# Patient Record
Sex: Female | Born: 1965 | Race: White | Hispanic: No | Marital: Married | Smoking: Former smoker
Health system: Southern US, Community
[De-identification: ages and names within clinical notes are randomized; demographics above are authoritative.]

## PROBLEM LIST (undated history)

## (undated) DIAGNOSIS — K219 Gastro-esophageal reflux disease without esophagitis: Secondary | ICD-10-CM

## (undated) DIAGNOSIS — M1990 Unspecified osteoarthritis, unspecified site: Secondary | ICD-10-CM

## (undated) DIAGNOSIS — J45909 Unspecified asthma, uncomplicated: Secondary | ICD-10-CM

## (undated) DIAGNOSIS — J449 Chronic obstructive pulmonary disease, unspecified: Secondary | ICD-10-CM

## (undated) DIAGNOSIS — E669 Obesity, unspecified: Secondary | ICD-10-CM

## (undated) DIAGNOSIS — R42 Dizziness and giddiness: Secondary | ICD-10-CM

## (undated) HISTORY — DX: Gastro-esophageal reflux disease without esophagitis: K21.9

## (undated) HISTORY — PX: UPPER GASTROINTESTINAL ENDOSCOPY: SHX188

## (undated) HISTORY — PX: LAPAROSCOPIC GASTRIC BANDING: SHX1100

## (undated) HISTORY — PX: BLADDER SURGERY: SHX569

## (undated) HISTORY — PX: TUBAL LIGATION: SHX77

## (undated) HISTORY — DX: Unspecified osteoarthritis, unspecified site: M19.90

## (undated) HISTORY — DX: Obesity, unspecified: E66.9

## (undated) HISTORY — DX: Chronic obstructive pulmonary disease, unspecified: J44.9

## (undated) HISTORY — PX: HERNIA REPAIR: SHX51

## (undated) HISTORY — DX: Unspecified asthma, uncomplicated: J45.909

---

## 2001-01-13 ENCOUNTER — Ambulatory Visit (HOSPITAL_COMMUNITY): Admission: RE | Admit: 2001-01-13 | Discharge: 2001-01-13 | Payer: Self-pay | Admitting: Pulmonary Disease

## 2001-04-25 ENCOUNTER — Ambulatory Visit (HOSPITAL_COMMUNITY): Admission: RE | Admit: 2001-04-25 | Discharge: 2001-04-25 | Payer: Self-pay | Admitting: Pulmonary Disease

## 2001-05-21 ENCOUNTER — Ambulatory Visit (HOSPITAL_COMMUNITY): Admission: RE | Admit: 2001-05-21 | Discharge: 2001-05-21 | Payer: Self-pay | Admitting: Pulmonary Disease

## 2001-05-23 ENCOUNTER — Ambulatory Visit (HOSPITAL_COMMUNITY): Admission: RE | Admit: 2001-05-23 | Discharge: 2001-05-23 | Payer: Self-pay | Admitting: General Surgery

## 2001-09-05 ENCOUNTER — Ambulatory Visit (HOSPITAL_COMMUNITY): Admission: RE | Admit: 2001-09-05 | Discharge: 2001-09-05 | Payer: Self-pay | Admitting: Pulmonary Disease

## 2001-09-16 ENCOUNTER — Ambulatory Visit (HOSPITAL_COMMUNITY): Admission: RE | Admit: 2001-09-16 | Discharge: 2001-09-16 | Payer: Self-pay | Admitting: Pulmonary Disease

## 2001-10-06 ENCOUNTER — Ambulatory Visit (HOSPITAL_COMMUNITY): Admission: RE | Admit: 2001-10-06 | Discharge: 2001-10-06 | Payer: Self-pay | Admitting: Pulmonary Disease

## 2001-10-08 ENCOUNTER — Ambulatory Visit: Admission: RE | Admit: 2001-10-08 | Discharge: 2001-10-08 | Payer: Self-pay | Admitting: Pulmonary Disease

## 2002-11-18 ENCOUNTER — Ambulatory Visit (HOSPITAL_COMMUNITY): Admission: RE | Admit: 2002-11-18 | Discharge: 2002-11-18 | Payer: Self-pay | Admitting: Pulmonary Disease

## 2003-01-05 ENCOUNTER — Emergency Department (HOSPITAL_COMMUNITY): Admission: EM | Admit: 2003-01-05 | Discharge: 2003-01-05 | Payer: Self-pay | Admitting: *Deleted

## 2004-02-06 ENCOUNTER — Emergency Department (HOSPITAL_COMMUNITY): Admission: EM | Admit: 2004-02-06 | Discharge: 2004-02-06 | Payer: Self-pay | Admitting: Emergency Medicine

## 2004-10-02 ENCOUNTER — Ambulatory Visit (HOSPITAL_COMMUNITY): Admission: RE | Admit: 2004-10-02 | Discharge: 2004-10-02 | Payer: Self-pay | Admitting: Pulmonary Disease

## 2004-10-06 ENCOUNTER — Ambulatory Visit (HOSPITAL_COMMUNITY): Admission: RE | Admit: 2004-10-06 | Discharge: 2004-10-06 | Payer: Self-pay | Admitting: Pulmonary Disease

## 2005-06-20 ENCOUNTER — Ambulatory Visit (HOSPITAL_COMMUNITY): Admission: RE | Admit: 2005-06-20 | Discharge: 2005-06-20 | Payer: Self-pay | Admitting: General Surgery

## 2005-10-31 ENCOUNTER — Ambulatory Visit (HOSPITAL_COMMUNITY): Admission: RE | Admit: 2005-10-31 | Discharge: 2005-10-31 | Payer: Self-pay | Admitting: Pulmonary Disease

## 2006-02-12 ENCOUNTER — Ambulatory Visit (HOSPITAL_COMMUNITY): Admission: RE | Admit: 2006-02-12 | Discharge: 2006-02-12 | Payer: Self-pay | Admitting: Pulmonary Disease

## 2008-01-10 ENCOUNTER — Emergency Department (HOSPITAL_COMMUNITY): Admission: EM | Admit: 2008-01-10 | Discharge: 2008-01-11 | Payer: Self-pay | Admitting: Emergency Medicine

## 2008-01-12 ENCOUNTER — Encounter: Payer: Self-pay | Admitting: Internal Medicine

## 2008-04-17 ENCOUNTER — Emergency Department (HOSPITAL_COMMUNITY): Admission: EM | Admit: 2008-04-17 | Discharge: 2008-04-18 | Payer: Self-pay | Admitting: Emergency Medicine

## 2008-04-17 ENCOUNTER — Ambulatory Visit: Payer: Self-pay | Admitting: Gastroenterology

## 2008-11-24 ENCOUNTER — Ambulatory Visit (HOSPITAL_COMMUNITY): Admission: RE | Admit: 2008-11-24 | Discharge: 2008-11-24 | Payer: Self-pay | Admitting: Pulmonary Disease

## 2009-07-20 ENCOUNTER — Other Ambulatory Visit: Admission: RE | Admit: 2009-07-20 | Discharge: 2009-07-20 | Payer: Self-pay | Admitting: Obstetrics & Gynecology

## 2009-07-28 ENCOUNTER — Ambulatory Visit (HOSPITAL_COMMUNITY): Admission: RE | Admit: 2009-07-28 | Discharge: 2009-07-28 | Payer: Self-pay | Admitting: Obstetrics & Gynecology

## 2009-08-17 ENCOUNTER — Emergency Department (HOSPITAL_COMMUNITY): Admission: EM | Admit: 2009-08-17 | Discharge: 2009-08-18 | Payer: Self-pay | Admitting: Emergency Medicine

## 2010-04-14 ENCOUNTER — Ambulatory Visit (HOSPITAL_COMMUNITY): Admission: RE | Admit: 2010-04-14 | Discharge: 2010-04-14 | Payer: Self-pay | Admitting: Obstetrics & Gynecology

## 2010-06-26 ENCOUNTER — Ambulatory Visit (HOSPITAL_COMMUNITY)
Admission: EM | Admit: 2010-06-26 | Discharge: 2010-06-26 | Payer: Self-pay | Source: Home / Self Care | Admitting: Emergency Medicine

## 2010-06-26 ENCOUNTER — Encounter: Payer: Self-pay | Admitting: Gastroenterology

## 2010-06-27 LAB — POCT I-STAT, CHEM 8
Glucose, Bld: 129 mg/dL — ABNORMAL HIGH (ref 70–99)
HCT: 44 % (ref 36.0–46.0)
Hemoglobin: 15 g/dL (ref 12.0–15.0)
Potassium: 3.9 mEq/L (ref 3.5–5.1)
Sodium: 139 mEq/L (ref 135–145)

## 2010-06-27 LAB — GLUCOSE, CAPILLARY: Glucose-Capillary: 158 mg/dL — ABNORMAL HIGH (ref 70–99)

## 2010-07-06 NOTE — Procedures (Signed)
Summary: Upper Endoscopy  Patient: [name] Note: All result statuses are Final unless otherwise noted.  Tests: (1) Upper Endoscopy (EGD)   EGD Upper Endoscopy       DONE     [facility]     [address]     [place], [place]  [address]           ENDOSCOPY PROCEDURE REPORT           PATIENT:  Terri [name], Terri [name]  MR#:  [id number]     BIRTHDATE:  November 28, 1965, 44 yrs. old  GENDER:  female     ENDOSCOPIST:  Rachael Fee, MD     PROCEDURE DATE:  06/26/2010     PROCEDURE:  EGD, diagnostic 43235     ASA CLASS:  Class II     INDICATIONS:  vomiting, suspected food impaction at Lap Band site     (has happened twice previously)     MEDICATIONS:  Fentanyl 75 mcg IV, Versed 7.5 mg IV     TOPICAL ANESTHETIC:  Cetacaine Spray           DESCRIPTION OF PROCEDURE:   After the risks benefits and     alternatives of the procedure were thoroughly explained, informed     consent was obtained.  The Pentax Gastroscope [id number] endoscope     was introduced through the mouth and advanced to the second     portion of the duodenum, without limitations.  The instrument was     slowly withdrawn as the mucosa was fully examined.     <<PROCEDUREIMAGES>>     There was narrowing of proximal gastric lumen at site of Lap Band.     There was no food proximally. There was a fairly large food bolus     in stomach. This likely just passed the band site with sedation     for the EGD (see image008 and image007).  Otherwise the     examination was normal (see image001, image004, image005, and     image006).    Retroflexed views revealed no abnormalities.    The     scope was then withdrawn from the patient and the procedure     completed.           COMPLICATIONS:  None           ENDOSCOPIC IMPRESSION:     1) Lap Band narrowing in proximal stomach.  Large food bolus in     distal stomach was likely impacted at lap band site and passed     just prior to EGD, perhaps from sedation given for the  procedure.           2) Otherwise normal examination           RECOMMENDATIONS:     This the third food impaction at Lap Band site in 3 years.     Should follow diet recommendations more strictly, chew food very     well and consider having some of the band fluid removed by     bariatric surgery.           ______________________________     Rachael Fee, MD           n.     eSIGNED:   Rachael Fee at 06/26/2010 01:52 PM           Carmelina Dane, [id number]  Note: An exclamation mark (!) indicates a result that was not  dispersed into the flowsheet. Document Creation Date: 06/26/2010 1:52 PM _______________________________________________________________________  (1) Order result status: Final Collection or observation date-time: 06/26/2010 13:44 Requested date-time:  Receipt date-time:  Reported date-time:  Referring Physician:   Ordering Physician: Rob Bunting [phone]) Specimen Source:  Source: Launa Grill Order Number: [id number] Lab site:

## 2010-08-13 ENCOUNTER — Emergency Department (HOSPITAL_COMMUNITY): Payer: Medicare Other

## 2010-08-13 ENCOUNTER — Emergency Department (HOSPITAL_COMMUNITY)
Admission: EM | Admit: 2010-08-13 | Discharge: 2010-08-13 | Disposition: A | Discharge status: Home / Self Care | Payer: Medicare Other | Attending: Emergency Medicine | Admitting: Emergency Medicine

## 2010-08-13 DIAGNOSIS — R42 Dizziness and giddiness: Secondary | ICD-10-CM | POA: Insufficient documentation

## 2010-08-13 DIAGNOSIS — J449 Chronic obstructive pulmonary disease, unspecified: Secondary | ICD-10-CM | POA: Insufficient documentation

## 2010-08-13 DIAGNOSIS — K219 Gastro-esophageal reflux disease without esophagitis: Secondary | ICD-10-CM | POA: Insufficient documentation

## 2010-08-13 DIAGNOSIS — H9319 Tinnitus, unspecified ear: Secondary | ICD-10-CM | POA: Insufficient documentation

## 2010-08-13 DIAGNOSIS — H8109 Meniere's disease, unspecified ear: Secondary | ICD-10-CM | POA: Insufficient documentation

## 2010-08-13 DIAGNOSIS — Z79899 Other long term (current) drug therapy: Secondary | ICD-10-CM | POA: Insufficient documentation

## 2010-08-13 DIAGNOSIS — E119 Type 2 diabetes mellitus without complications: Secondary | ICD-10-CM | POA: Insufficient documentation

## 2010-08-13 DIAGNOSIS — J4489 Other specified chronic obstructive pulmonary disease: Secondary | ICD-10-CM | POA: Insufficient documentation

## 2010-08-13 LAB — BASIC METABOLIC PANEL
CO2: 27 mEq/L (ref 19–32)
GFR calc Af Amer: >60 mL/min (ref >60)
GFR calc non Af Amer: >60 mL/min (ref >60)
Glucose, Bld: 265 mg/dL — ABNORMAL HIGH (ref 70–99)
Sodium: 137 mEq/L (ref 135–145)

## 2010-08-13 LAB — GLUCOSE, CAPILLARY: Glucose-Capillary: 188 mg/dL — ABNORMAL HIGH (ref 70–99)

## 2010-08-15 LAB — CBC
HCT: 42.7 % (ref 36.0–46.0)
Hemoglobin: 14.3 g/dL (ref 12.0–15.0)
MCH: 29.8 pg (ref 26.0–34.0)
MCV: 88.9 fL (ref 78.0–100.0)
Platelets: 234 10*3/uL (ref 150–400)
RBC: 4.81 MIL/uL (ref 3.87–5.11)

## 2010-08-15 LAB — COMPREHENSIVE METABOLIC PANEL
ALT: 20 U/L (ref 0–35)
Albumin: 3.4 g/dL — ABNORMAL LOW (ref 3.5–5.2)
BUN: 8 mg/dL (ref 6–23)
GFR calc non Af Amer: >60 mL/min (ref >60)
Potassium: 4 mEq/L (ref 3.5–5.1)
Sodium: 131 mEq/L — ABNORMAL LOW (ref 135–145)

## 2010-08-21 ENCOUNTER — Encounter (INDEPENDENT_AMBULATORY_CARE_PROVIDER_SITE_OTHER): Payer: Self-pay | Admitting: *Deleted

## 2010-08-21 ENCOUNTER — Ambulatory Visit: Payer: Self-pay | Admitting: Gastroenterology

## 2010-08-31 NOTE — Letter (Signed)
Summary: New Patient letter  [facility]  [address] [place], [place] [address]   Phone: [phone]  Fax: [phone]       08/21/2010 MRN: [id number]  [facility] [address] [place], [place]  [address]  Dear Ms. Valcarcel,  Welcome to the Gastroenterology Division at [facility].    You are scheduled to see Dr.  Christella Hartigan on 10-02-10 at 10:00A.M. on the 3rd floor at [facility], [address]  We ask that you try to arrive at our office 15 minutes prior to your appointment time to allow for check-in.  We would like you to complete the enclosed self-administered evaluation form prior to your visit and bring it with you on the day of your appointment.  We will review it with you.  Also, please bring a complete list of all your medications or, if you prefer, bring the medication bottles and we will list them.  Please bring your insurance card so that we may make a copy of it.  If your insurance requires a referral to see a specialist, please bring your referral form from your primary care physician.  Co-payments are due at the time of your visit and may be paid by cash, check or credit card.     Your office visit will consist of a consult with your physician (includes a physical exam), any laboratory testing he/she may order, scheduling of any necessary diagnostic testing (e.g. x-ray, ultrasound, CT-scan), and scheduling of a procedure (e.g. Endoscopy, Colonoscopy) if required.  Please allow enough time on your schedule to allow for any/all of these possibilities.    If you cannot keep your appointment, please call [phone] to cancel or reschedule prior to your appointment date.  This allows [place] the opportunity to schedule an appointment for another patient in need of care.  If you do not cancel or reschedule by 5 p.m. the business day prior to your appointment date, you will be charged a $50.00 late cancellation/no-show fee.    Thank you for choosing [facility] for your medical needs.  We appreciate the opportunity to care for you.  Please visit [place] at our website  to learn more about our practice.                     Sincerely,                                                             The Gastroenterology Division

## 2010-10-02 ENCOUNTER — Ambulatory Visit: Payer: Medicare Other | Admitting: Internal Medicine

## 2010-10-02 ENCOUNTER — Encounter: Payer: Self-pay | Admitting: Gastroenterology

## 2010-10-02 ENCOUNTER — Ambulatory Visit (INDEPENDENT_AMBULATORY_CARE_PROVIDER_SITE_OTHER): Payer: Medicare Other | Admitting: Gastroenterology

## 2010-10-02 DIAGNOSIS — R1310 Dysphagia, unspecified: Secondary | ICD-10-CM

## 2010-10-02 NOTE — Progress Notes (Signed)
HPI: This is a  very pleasant 45 year old woman whom I last saw 3 months ago at the time of EGD, acute dysphasia.  Has chronic intermittent dysphagia to solid foods, Not to liquids at all.  Overall stable weight.   [place]ent to [place] to bariatric facility in [place], there was only 2cc of fluid in the band, this was removed.  She was told her problem may be from her eophagus.  EGD 3-4 months ago, by me. Found a narrowing in her proximal stomach that I felt was likely from the lap band. Food had artery progressed into her distal stomach and I suspected that the sedation caused a relaxation and the food bolus which was probably impacted was able to pass more distally.  Following that she went to a bariatric facility in [place] to remove fluid from the lap band but they told her that there was very little fluid in it and he suspected her dysphasia issues were more related to an esophageal problem.  Lap band 2009, swallowing troubles started 2010 or early 2011.   Review of systems: Pertinent positive and negative review of systems were noted in the above HPI section.  All other review of systems was otherwise negative.   Past Medical History, Past Surgical History, Family History, Social History, Current Medications, Allergies were all reviewed with the patient via [facility] electronic medical record system.   Physical Exam: BP 128/76  Pulse 64  Ht 5\' 6"  (1.676 m)  Wt 261 lb (118.389 kg)  BMI 42.13 kg/m2 Constitutional: generally well-appearing Psychiatric: alert and oriented x3 Eyes: extraocular movements intact Mouth: oral pharynx moist, no lesions Neck: supple no lymphadenopathy Cardiovascular: heart regular rate and rhythm Lungs: clear to auscultation bilaterally Abdomen: soft, nontender, nondistended, no obvious ascites, no peritoneal signs, normal bowel sounds Extremities: no lower extremity edema bilaterally Skin: no lesions on visible extremities    Assessment and  plan: 45 y.o. female with intermittent dysphagia to solids  She may have underlying esophageal issue. I think I will first proceed with a area of esophagram and upper GI to see if she has a narrowing at the site of her previous lap band surgery. I understand there was very little fluid in it but perhaps the apparatus itself is causing too much luminal narrowing. She also does not have upper teeth and so cannot chew her food well. That is likely contributing to her problem. She does however tell me that even eggs and mashed potatoes can get cough. Depending on the results of the radiology testing I may proceed with esophageal manometry.

## 2010-10-02 NOTE — Patient Instructions (Signed)
We will look for previous EGD reports (prior to 06/2010 EGD with Dr. Christella Hartigan). Barium esophagram, UGI to check for strictures, narrowings (h/o lap band procedure 2009). You may need a muscle study of esophagus (manometry test) pending the results of the radiology test.

## 2010-10-05 ENCOUNTER — Other Ambulatory Visit: Payer: Self-pay | Admitting: Gastroenterology

## 2010-10-05 ENCOUNTER — Ambulatory Visit (HOSPITAL_COMMUNITY): Admission: RE | Admit: 2010-10-05 | Payer: Medicare Other | Source: Ambulatory Visit

## 2010-10-05 ENCOUNTER — Ambulatory Visit (HOSPITAL_COMMUNITY)
Admission: RE | Admit: 2010-10-05 | Discharge: 2010-10-05 | Disposition: A | Discharge status: Home / Self Care | Payer: Medicare Other | Source: Ambulatory Visit | Attending: Gastroenterology | Admitting: Gastroenterology

## 2010-10-05 DIAGNOSIS — K3189 Other diseases of stomach and duodenum: Secondary | ICD-10-CM | POA: Insufficient documentation

## 2010-10-05 DIAGNOSIS — Z9884 Bariatric surgery status: Secondary | ICD-10-CM | POA: Insufficient documentation

## 2010-10-05 DIAGNOSIS — R110 Nausea: Secondary | ICD-10-CM | POA: Insufficient documentation

## 2010-10-05 DIAGNOSIS — R1013 Epigastric pain: Secondary | ICD-10-CM | POA: Insufficient documentation

## 2010-10-06 ENCOUNTER — Telehealth: Payer: Self-pay

## 2010-10-06 DIAGNOSIS — R110 Nausea: Secondary | ICD-10-CM

## 2010-10-06 NOTE — Telephone Encounter (Signed)
Pt aware of both test date and times and all instructions, the instructions have also been mailed to the home,  Pt aware to call with any questions

## 2010-10-06 NOTE — Telephone Encounter (Signed)
Message copied by Chales Abrahams on Fri Oct 06, 2010  8:25 AM ------      Message from: Rob Bunting      Created: Thu Oct 05, 2010  9:05 PM                   Please call the patient.  She needs a gastric emptying scan and also an esophageal manometry test at [facility].  After both of those, I'd like to see her in office for rov to discuss all the results.            thanks

## 2010-10-11 ENCOUNTER — Telehealth: Payer: Self-pay | Admitting: Gastroenterology

## 2010-10-11 NOTE — Telephone Encounter (Signed)
EGD with Dr. Leone Payor 01/2008; no food in esophagus, +food in proximal stomach EGD with Dr. Russella Dar 04/2008: retained food in distal esophagus then free text "meat impaction at lap band site pushed gently into the stomach"

## 2010-10-13 ENCOUNTER — Encounter (HOSPITAL_COMMUNITY): Payer: Self-pay

## 2010-10-13 ENCOUNTER — Encounter (HOSPITAL_COMMUNITY)
Admission: RE | Admit: 2010-10-13 | Discharge: 2010-10-13 | Disposition: A | Discharge status: Home / Self Care | Payer: Medicare Other | Source: Ambulatory Visit | Attending: Gastroenterology | Admitting: Gastroenterology

## 2010-10-13 DIAGNOSIS — R110 Nausea: Secondary | ICD-10-CM

## 2010-10-13 DIAGNOSIS — R1310 Dysphagia, unspecified: Secondary | ICD-10-CM | POA: Insufficient documentation

## 2010-10-13 DIAGNOSIS — R112 Nausea with vomiting, unspecified: Secondary | ICD-10-CM | POA: Insufficient documentation

## 2010-10-13 DIAGNOSIS — K219 Gastro-esophageal reflux disease without esophagitis: Secondary | ICD-10-CM | POA: Insufficient documentation

## 2010-10-13 MED ORDER — TECHNETIUM TC 99M SULFUR COLLOID
1.8000 | Freq: Once | INTRAVENOUS | Status: AC | PRN
  Administered 2010-10-13: 1.8 via INTRAVENOUS

## 2010-10-20 NOTE — Op Note (Signed)
[facility]  Patient:    Terri [name], Terri [name] Visit Number: [id number] MRN: [id number]          Service Type: DSU Location: DAY Attending Physician:  Dalia Heading Dictated by:   Franky Macho, M.D. Proc. Date: 05/23/01 Admit Date:  05/23/2001   CC:         Kari Baars, M.D.   Operative Report  AGE:  45 years old.  PREOPERATIVE DIAGNOSIS:  Umbilical hernia.  POSTOPERATIVE DIAGNOSIS:  Umbilical hernia.  PROCEDURE:  Umbilical herniorrhaphy.  SURGEON:  Franky Macho, M.D.  ANESTHESIA:  General.  INDICATIONS:  Patient is a 45 year old white female who presents with a symptomatic umbilical hernia.  The risks and benefits of the procedure including bleeding, infection, and cardiopulmonary difficulties as well as recurrence of the hernia were fully explained to the patient, who gave informed consent.  DESCRIPTION OF PROCEDURE:  Patient was placed in the supine position.  After general anesthesia was administered, the abdomen was prepped and draped using the usual sterile technique with Betadine.  An infraumbilical incision was made down to the fascia.  The umbilicus was freed away from the underlying fascia.  The hernia defect was identified and its contents reduced into the abdomen.  The fascia was then reapproximated using 0 Surgidac interrupted sutures.  The base of the umbilicus was secured to the fascia using a 2-0 Vicryl interrupted suture.  The subcutaneous layer was reapproximated using a 3-0 Vicryl interrupted suture.  The skin was closed using a 4-0 Vicryl subcuticular suture.  Marcaine 0.5% was instilled into the surrounding wound and the wound was covered with collodion.  All tape and needle counts were correct at the end of the procedure.  The patient was awakened and transferred to PACU in stable condition.  COMPLICATIONS:  None.  SPECIMEN:  None.  BLOOD LOSS:  Minimal. Dictated by:   Franky Macho, M.D. Attending  Physician:  Dalia Heading DD:  05/23/01 TD:  05/24/01 Job: [id number]

## 2010-10-20 NOTE — Procedures (Signed)
[facility]  Patient:    Terri [name], Terri [name] Visit Number: [id number] MRN: [id number]          Service Type: Attending:  Kari Baars, M.D. Dictated by:   Kari Baars, M.D.                      Pulmonary Function Test Inter.  IMPRESSION: 1. Spirometry shows moderate ventilatory defect with air flow obstruction in    the area of the small airways. 2. Lung volumes show moderate restrictive change and some evidence of air    trapping. 3. DLCO is severely reduced. 4. There is no significant bronchodilator response. 5. Arterial blood gases are normal.  ADDENDUM:  Apparently patient had a great deal of coughing during the examination and the flow volume loops are not tremendously reproducible which may indicate a test that is of less accuracy in reproducibility. Dictated by:   Kari Baars, M.D. Attending:  Kari Baars, M.D. DD:  05/21/01 TD:  05/22/01 Job: [id number]

## 2010-10-20 NOTE — Op Note (Signed)
Terri [name], Terri [name]               ACCOUNT NO.:  [id number]   MEDICAL RECORD NO.:  [id number]          PATIENT TYPE:  AMB   LOCATION:  DAY                           FACILITY:  [facility]   PHYSICIAN:  Dalia Heading, M.D.  DATE OF BIRTH:  23-Dec-1965   DATE OF PROCEDURE:  06/20/2005  DATE OF DISCHARGE:                                 OPERATIVE REPORT   PREOPERATIVE DIAGNOSIS:  Incisional hernia.   POSTOPERATIVE DIAGNOSIS:  Incisional hernia.   PROCEDURE:  Incisional herniorrhaphy with mesh.   SURGEON:  Dr. Franky Macho.   ANESTHESIA:  General endotracheal.   INDICATIONS:  The patient is a 45 year old white female status post an  umbilical herniorrhaphy in 2002 who recently began experiencing swelling and  pain at the umbilical region. She has an incisional hernia at the umbilicus  and now presents for incisional herniorrhaphy with mesh placement. Risks and  benefits of the procedure including bleeding, infection and recurrence of  the hernia were fully explained to the patient, who gave informed consent.   PROCEDURE NOTE:  The patient was placed in the supine position. After  induction of general endotracheal anesthesia, the abdomen was prepped and  draped using the usual sterile technique with Techni-Care. Surgical site  confirmation was performed.   An infraumbilical incision was made down to the fascia. The hernia was freed  away from the underlying hernia defect. The hernia sac was excised to  healthy fascial edges. The omentum was noted be within the hernia sac, and  this was reduced without difficulty. A medium-sized polypropylene mesh plug  was then placed into this region and secured circumferentially to the  transversalis fascia using 2-0 Novofil interrupted sutures. The fascia  overlying the patch was then closed transversely using 0 Ethilon interrupted  sutures. The base the umbilicus was secured back to the fascia using a 2-0  Vicryl interrupted suture. The subcutaneous  layer was reapproximated using 3-  0 Vicryl interrupted suture. The skin was closed using staples. Sensorcaine  0.5% was instilled into the surrounding wound. Bacitracin ointment and dry  sterile dressing were then applied.   All tape and needle counts were correct at the end of the procedure. The  patient was extubated in the operating room and went back to recovery room  awake in stable condition.   COMPLICATIONS:  None.   SPECIMEN:  None.   BLOOD LOSS:  Minimal.      Dalia Heading, M.D.  Electronically Signed     MAJ/MEDQ  D:  06/20/2005  T:  06/20/2005  Job:  [id number]

## 2010-10-20 NOTE — H&P (Signed)
NAME:  Terri [name], Terri [name]               ACCOUNT NO.:  [id number]   MEDICAL RECORD NO.:  [id number]          PATIENT TYPE:  AMB   LOCATION:  DAY                           FACILITY:  [facility]   PHYSICIAN:  Dalia Heading, M.D.  DATE OF BIRTH:  1965-10-10   DATE OF ADMISSION:  DATE OF DISCHARGE:  LH                                HISTORY & PHYSICAL   CHIEF COMPLAINT:  Incisional hernia.   HISTORY OF PRESENT ILLNESS:  The patient is a 45 year old white female  status post an umbilical herniorrhaphy in 2002. Recently, she began  experiencing swelling and pain in the umbilical region. She had an episode  of coughing and feels like something has popped.   PAST MEDICAL HISTORY:  1.  Chronic bronchitis/asthma.  2.  Early COPD.   PAST SURGICAL HISTORY:  As noted above.  1.  Bladder tack up.  2.  Cesarean section.   CURRENT MEDICATIONS:  Wellbutrin, Tussin.   ALLERGIES:  CIPROFLOXACIN, SULFA, PENICILLIN.   REVIEW OF SYSTEMS:  The patient smokes less than a pack of cigarettes a day.  She denies any alcohol use. She denies any other cardiac problems or  bleeding difficulties.   PHYSICAL EXAMINATION:  GENERAL:  The patient is well-developed, well-  nourished, white female in no acute distress.  LUNGS:  Clear to auscultation with equal breath sounds bilaterally.  HEART:  Reveals a regular rate and rhythm without S3, S4 or murmurs.  ABDOMEN:  Soft, nontender, nondistended. No hepatosplenomegaly or masses are  noted. Reducible supraumbilical hernia is noted.   IMPRESSION:  Incisional hernia.   PLAN:  The patient was scheduled for incisional herniorrhaphy with possible  mesh placement on June 20, 2005. The risks and benefits of the procedure  including bleeding, infection, recurrence of the hernia were fully explained  to the patient who gave informed consent.      Dalia Heading, M.D.  Electronically Signed     MAJ/MEDQ  D:  06/19/2005  T:  06/19/2005  Job:  [id number]

## 2010-10-20 NOTE — Procedures (Signed)
[facility]  Patient:    [name], [name] Visit Number: [id number] MRN: [id number]          Service Type: Attending:  Kari Baars, M.D. Dictated by:   Kari Baars, M.D. Proc. Date: 09/05/01 Adm. Date:  09/05/01                      Pulmonary Function Test Inter.  RESULTS: 1. Spirometry shows severe ventilatory defect with a pattern that looks mostly    from restrictive change. 2. Lung volumes show restrictive changes. 3. DLCO is severely reduced. 4. Former arterial blood gases are normal. 5. There is no significant bronchodilator response. Dictated by:   Kari Baars, M.D. Attending:  Kari Baars, M.D. DD:  09/05/01 TD:  09/06/01 Job: [id number]

## 2010-10-23 ENCOUNTER — Ambulatory Visit (HOSPITAL_COMMUNITY)
Admission: RE | Admit: 2010-10-23 | Discharge: 2010-10-23 | Disposition: A | Discharge status: Home / Self Care | Payer: Medicare Other | Source: Ambulatory Visit | Attending: Gastroenterology | Admitting: Gastroenterology

## 2011-10-30 ENCOUNTER — Other Ambulatory Visit (HOSPITAL_COMMUNITY): Payer: Self-pay | Admitting: Pulmonary Disease

## 2011-10-30 ENCOUNTER — Ambulatory Visit (HOSPITAL_COMMUNITY)
Admission: RE | Admit: 2011-10-30 | Discharge: 2011-10-30 | Disposition: A | Discharge status: Home / Self Care | Payer: Medicare Other | Source: Ambulatory Visit | Attending: Pulmonary Disease | Admitting: Pulmonary Disease

## 2011-10-30 DIAGNOSIS — M25529 Pain in unspecified elbow: Secondary | ICD-10-CM | POA: Insufficient documentation

## 2011-10-30 DIAGNOSIS — M25539 Pain in unspecified wrist: Secondary | ICD-10-CM | POA: Insufficient documentation

## 2011-11-17 ENCOUNTER — Encounter (HOSPITAL_COMMUNITY): Payer: Self-pay | Admitting: Emergency Medicine

## 2011-11-17 ENCOUNTER — Encounter (HOSPITAL_COMMUNITY)
Admission: EM | Disposition: A | Discharge status: Home / Self Care | Payer: Self-pay | Source: Home / Self Care | Attending: Emergency Medicine

## 2011-11-17 ENCOUNTER — Ambulatory Visit (HOSPITAL_COMMUNITY)
Admission: EM | Admit: 2011-11-17 | Discharge: 2011-11-17 | Disposition: A | Discharge status: Home / Self Care | Payer: PRIVATE HEALTH INSURANCE | Attending: Emergency Medicine | Admitting: Emergency Medicine

## 2011-11-17 DIAGNOSIS — T182XXA Foreign body in stomach, initial encounter: Secondary | ICD-10-CM | POA: Insufficient documentation

## 2011-11-17 DIAGNOSIS — Z9884 Bariatric surgery status: Secondary | ICD-10-CM | POA: Insufficient documentation

## 2011-11-17 DIAGNOSIS — T18128A Food in esophagus causing other injury, initial encounter: Secondary | ICD-10-CM

## 2011-11-17 DIAGNOSIS — M129 Arthropathy, unspecified: Secondary | ICD-10-CM | POA: Insufficient documentation

## 2011-11-17 DIAGNOSIS — K219 Gastro-esophageal reflux disease without esophagitis: Secondary | ICD-10-CM | POA: Insufficient documentation

## 2011-11-17 DIAGNOSIS — J4489 Other specified chronic obstructive pulmonary disease: Secondary | ICD-10-CM | POA: Insufficient documentation

## 2011-11-17 DIAGNOSIS — IMO0002 Reserved for concepts with insufficient information to code with codable children: Secondary | ICD-10-CM | POA: Insufficient documentation

## 2011-11-17 DIAGNOSIS — T18108A Unspecified foreign body in esophagus causing other injury, initial encounter: Secondary | ICD-10-CM

## 2011-11-17 DIAGNOSIS — R1310 Dysphagia, unspecified: Secondary | ICD-10-CM

## 2011-11-17 DIAGNOSIS — E669 Obesity, unspecified: Secondary | ICD-10-CM | POA: Insufficient documentation

## 2011-11-17 DIAGNOSIS — J449 Chronic obstructive pulmonary disease, unspecified: Secondary | ICD-10-CM | POA: Insufficient documentation

## 2011-11-17 DIAGNOSIS — E119 Type 2 diabetes mellitus without complications: Secondary | ICD-10-CM | POA: Insufficient documentation

## 2011-11-17 HISTORY — PX: ESOPHAGOGASTRODUODENOSCOPY: SHX5428

## 2011-11-17 LAB — POCT I-STAT, CHEM 8
BUN: 14 mg/dL (ref 6–23)
Calcium, Ion: 1.15 mmol/L (ref 1.12–1.32)
Hemoglobin: 15 g/dL (ref 12.0–15.0)
Sodium: 140 mEq/L (ref 135–145)
TCO2: 23 mmol/L (ref 0–100)

## 2011-11-17 SURGERY — EGD (ESOPHAGOGASTRODUODENOSCOPY)
Anesthesia: Moderate Sedation

## 2011-11-17 MED ORDER — FENTANYL CITRATE 0.05 MG/ML IJ SOLN
INTRAMUSCULAR | Status: AC
  Filled 2011-11-17: qty 4

## 2011-11-17 MED ORDER — SODIUM CHLORIDE 0.9 % IV SOLN: Freq: Once | INTRAVENOUS | Status: DC

## 2011-11-17 MED ORDER — BUTAMBEN-TETRACAINE-BENZOCAINE 2-2-14 % EX AERO
INHALATION_SPRAY | CUTANEOUS | Status: DC | PRN
  Administered 2011-11-17: 2 via TOPICAL

## 2011-11-17 MED ORDER — DIPHENHYDRAMINE HCL 50 MG/ML IJ SOLN
INTRAMUSCULAR | Status: AC
  Filled 2011-11-17: qty 1

## 2011-11-17 MED ORDER — FENTANYL NICU IV SYRINGE 50 MCG/ML
INJECTION | INTRAMUSCULAR | Status: DC | PRN
  Administered 2011-11-17 (×4): 25 ug via INTRAVENOUS

## 2011-11-17 MED ORDER — MIDAZOLAM HCL 10 MG/2ML IJ SOLN
INTRAMUSCULAR | Status: DC | PRN
  Administered 2011-11-17 (×2): 2 mg via INTRAVENOUS
  Administered 2011-11-17: 1 mg via INTRAVENOUS
  Administered 2011-11-17: 2 mg via INTRAVENOUS

## 2011-11-17 MED ORDER — MIDAZOLAM HCL 10 MG/2ML IJ SOLN
INTRAMUSCULAR | Status: AC
  Filled 2011-11-17: qty 4

## 2011-11-17 NOTE — ED Provider Notes (Addendum)
History   This chart was scribed for Gwyneth Sprout, MD by Shari Heritage. The patient was seen in room STRE4/STRE4. Patient's care was started at 1710.     CSN: [id number]  Arrival date & time 11/17/11  1710   First MD Initiated Contact with Patient 11/17/11 1756      Chief Complaint  Patient presents with  . Swallowed Foreign Body    (Consider location/radiation/quality/duration/timing/severity/associated sxs/prior treatment) The history is provided by the patient. No language interpreter was used.   Terri [name] is a 46 y.o. female who presents to the Emergency Department complaining of a foreign body (steak) stuck in her esophagus onset yesterday. Patient denies difficulty swallowing and breathing.  Patient has had food stuck in her esophagus 3 times before. Each time a GI specialist was needed to resolve the issue.  Patient says that she struggles to eat and properly digest her food everyday. Patient usually cuts food very small and drinks an increased amount of fluids. Patient says when food is stuck, she can't drink or eat without vomiting up the fluids or food.  Patient says she needs surgery on her esophageal sphincter. She hadn't completed the surgery before due to a lapse in insurance.  Patient with h/o COPD, diabetes, GERD, and arthritis. Patient with surgical history of laparoscopic gastric banding, hernia repair, tubal ligation and bladder surgery.  Past Medical History  Diagnosis Date  . COPD (chronic obstructive pulmonary disease)   . Diabetes mellitus   . GERD (gastroesophageal reflux disease)   . Arthritis   . Obesity     Past Surgical History  Procedure Date  . Laparoscopic gastric banding   . Hernia repair   . Tubal ligation   . Bladder surgery     Family History  Problem Relation Age of Onset  . Colon cancer Neg Hx   . Breast cancer Mother   . Diabetes Mother     Sister and Father  . Heart disease Father     and Sister  . Cirrhosis Father       Non-alcholic     History  Substance Use Topics  . Smoking status: Former Smoker    Quit date: 11/16/2005  . Smokeless tobacco: Never Used  . Alcohol Use: Yes     occasion    OB History    Grav Para Term Preterm Abortions TAB SAB Ect Mult Living                  Review of Systems A complete 10 system review of systems was obtained and all systems are negative except as noted in the HPI and PMH.   Allergies  Cephalexin; Ciprofloxacin hcl; Penicillins; and Sulfa antibiotics  Home Medications   Current Outpatient Rx  Name Route Sig Dispense Refill  . GLYBURIDE-METFORMIN 5-500 MG PO TABS Oral Take 2 tablets by mouth at bedtime.       BP 128/56  Pulse 85  Temp 97.7 F (36.5 C) (Oral)  Resp 19  SpO2 97%  Physical Exam  Nursing note and vitals reviewed. Constitutional: She is oriented to person, place, and time. She appears well-developed and well-nourished. No distress.  HENT:  Head: Normocephalic and atraumatic.       Dry mucous membranes.  Eyes: Conjunctivae and EOM are normal.  Neck: Neck supple.  Cardiovascular: Normal rate.   Pulmonary/Chest: Effort normal. No respiratory distress.  Abdominal: She exhibits no distension.  Musculoskeletal: Normal range of motion.  Neurological: She is alert and oriented  to person, place, and time. No sensory deficit.  Skin: Skin is dry.  Psychiatric: She has a normal mood and affect. Her behavior is normal.    ED Course  Procedures (including critical care time) DIAGNOSTIC STUDIES: Oxygen Saturation is 97% on room air, adequate by my interpretation.    COORDINATION OF CARE: 6:05PM- Patient was screened. Patient will be moved from stretcher triage to CDU to be seen by a GI specialist.  Labs Reviewed - No data to display No results found.   1. Food impaction of esophagus       MDM   Patient with symptoms consistent with a food bolus impaction. They have been there 4 PM yesterday she thought it would improve  however today she has been able unable to eat or drink without vomiting. She also states she is not able to keep her saliva down either. She has had 3 endoscopies for the same thing. Once she had bolus impaction in the other 2 times were sphincter spasm. On exam patient is otherwise well appearing with dry mucous membranes. Will speak with GI to scope the patient.  Gi will come in and sedate the pt and do an endoscopy.   I personally performed the services described in this documentation, which was scribed in my presence.  The recorded information has been reviewed and considered.    Gwyneth Sprout, MD 11/17/11 1813  Gwyneth Sprout, MD 11/17/11 7846  Gwyneth Sprout, MD 11/17/11 9629

## 2011-11-17 NOTE — ED Notes (Signed)
Pt reports she has food stuck, steak, yesterday; needs GI to take it out, has happened 3 times where needed GI; pt able to have full conversation with no difficulty; denies difficulty swallowing or breathing; pt reports anything she has been eating or drinking , she has vomited; pt in NAD

## 2011-11-17 NOTE — H&P (Signed)
EGD with Dr. Leone Payor 01/2008; no food in esophagus, +food in proximal stomach  EGD with Dr. Russella Dar 04/2008: retained food in distal esophagus then free text "meat impaction at lap band site pushed gently into the stomach" EGD Dr. Christella Hartigan 06/2010: Lap Band narrowing in proximal stomach. Large food bolus in distal stomach was likely impacted at lap band site and passed just prior to EGD, perhaps from sedation given for the procedure.  Barium esophagram 09/2010: 1. No fixed esophageal strictures or masses. No evidence of hiatal hernia. Gastroesophageal reflux was not elicited. 2. No evidence of hiatal hernia. 3. Normal esophageal peristalsis. 4. Delayed gastric emptying without evidence of gastric outlet obstruction. 5. Mild, non-obstructive indentation of the posterior cervical esophagus at the C5 level due to an anterior osteophyte. 6. Appropriately oriented laparoscopic band. As the band was decompressed, no evidence of obstruction to the flow of thin barium or the 12.5 mm barium tablet by the band.  Gastric emptying scan 10/2010: slightly delayed gastric emptying scan  Esophageal manometry scheduled but she never went 2012  HPI:  This is a Woman whom I last saw in office about a year ago. She has chronic dysphagia to solids for years. Has to chew food well, intermittently regurge or vomit. She has presented to ER for "food impaction" 3 times previously but food has never been caught in eosphagus, rather always seems to be hung in stomach at site of lap band. Her bariatric surgeon has removed all of the fluid from the band but the band is still in place. Today, presented to ER with "food caught", steak since last night. She does not have a spit cup but tells me that she cannot swallow more than 4-5 times without having to vomit.  Review of systems:  Pertinent positive and negative review of systems were noted in the above HPI section. Complete review of systems was performed and was otherwise normal.  Past  Medical History   Diagnosis  Date   .  COPD (chronic obstructive pulmonary disease)    .  Diabetes mellitus    .  GERD (gastroesophageal reflux disease)    .  Arthritis    .  Obesity     Past Surgical History   Procedure  Date   .  Laparoscopic gastric banding    .  Hernia repair    .  Tubal ligation    .  Bladder surgery     No current facility-administered medications for this encounter.    Current Outpatient Prescriptions   Medication  Sig  Dispense  Refill   .  glyBURIDE-metformin (GLUCOVANCE) 5-500 MG per tablet  Take 2 tablets by mouth at bedtime.      Allergies as of 11/17/2011 - Review Complete 11/17/2011   Allergen  Reaction  Noted   .  Cephalexin   10/02/2010   .  Ciprofloxacin hcl   10/02/2010   .  Penicillins   10/02/2010   .  Sulfa antibiotics   10/02/2010    Family History   Problem  Relation  Age of Onset   .  Colon cancer  Neg Hx    .  Breast cancer  Mother    .  Diabetes  Mother       Sister and Father    .  Heart disease  Father       and Sister    .  Cirrhosis  Father       Non-alcholic    History  Social History   .  Marital Status:  Married     Spouse Name:  N/A     Number of Children:  4   .  Years of Education:  N/A    Occupational History   .  Disabled     Social History Main Topics   .  Smoking status:  Former Smoker     Quit date:  11/16/2005   .  Smokeless tobacco:  Never Used   .  Alcohol Use:  Yes      occasion   .  Drug Use:  No   .  Sexually Active:  Not on file    Other Topics  Concern   .  Not on file    Social History Narrative    2 caffeine drinks daily    Physical Exam:  BP 128/56  Pulse 85  Temp 97.7 F (36.5 C) (Oral)  Resp 19  SpO2 97%  Constitutional: generally well-appearing  Psychiatric: alert and oriented x3  Eyes: extraocular movements intact  Mouth: oral pharynx moist, no lesions  Neck: supple no lymphadenopathy  Cardiovascular: heart regular rate and rhythm  Lungs: clear to auscultation  bilaterally  Abdomen: soft, nontender, nondistended, no obvious ascites, no peritoneal signs, normal bowel sounds  Extremities: no lower extremity edema bilaterally  Skin: no lesions on visible extremities  Assessment and plan:  46 y.o. female with Chronic swallowing difficulty, not clear if this is an esophageal issue or (as suspected by her previous 3 EGDs) food is hanging at site of lap band.  Will proceed with EGD in ER. I don't think she has food caught in esophagus, she has no spit cup, talking comfortably, but I am very interested to see where the food bolus is.

## 2011-11-17 NOTE — ED Notes (Signed)
The patient is being transferred to endo.  The endo RN and MD are present and will handle the case from here on out.

## 2011-11-17 NOTE — Op Note (Signed)
[name] [address] [place], [place]  [address]  ENDOSCOPY PROCEDURE REPORT  PATIENT:  Terri [name], Terri [name]  MR#:  [id number] BIRTHDATE:  08-Oct-1965, 45 yrs. old  GENDER:  female ENDOSCOPIST:  Rachael Fee, MD PROCEDURE DATE:  11/17/2011 PROCEDURE:  EGD with foreign body removal ASA CLASS:  Class III INDICATIONS:  EGD with Dr. Leone Payor 01/2008; no food in esophagus, +food in proximal stomach EGD with Dr. Russella Dar 04/2008: "meat impaction at lap band site pushed gently into the stomach" EGD Dr. Christella Hartigan 06/2010: Lap Band narrowing in proximal stomach. Large food bolus in distal stomach was likely impacted at lap band site and passed just prior to EGD, perhaps from sedation given for the procedure. MEDICATIONS:  Fentanyl 80 mcg IV, Versed 7 mg IV TOPICAL ANESTHETIC:  Cetacaine Spray  DESCRIPTION OF PROCEDURE:   After the risks benefits and alternatives of the procedure were thoroughly explained, informed consent was obtained.  The Pentax Gastroscope [id number] endoscope was introduced through the mouth and advanced to the stomach body, without limitations.  The instrument was slowly withdrawn as the mucosa was fully examined. <<PROCEDUREIMAGES>>  There was solid food bolus caught JUST BELOW THE GE JUNCTION AT THE SITE OF THE LAP BAND, SEE THE PICTURES ABOVE. The food was gently passed beyond the narrowing caused by the lap band. The lap band is positioned approximately 5-40mm below the GE junction (see image002, image003, image004, and image005).    Retroflexed views revealed no abnormalities.    The scope was then withdrawn from the patient and the procedure completed. COMPLICATIONS:  None  ENDOSCOPIC IMPRESSION: 1) Food impaction at site of lap band  RECOMMENDATIONS: The lap band, even though empty of fluid, is still causing your daily dysphagia as well as your 4 food impactions in the past 1-2 years.  You should meet with a surgeon to have the lap  band removed.  ______________________________ Rachael Fee, MD  n. eSIGNED:   Rachael Fee at 11/17/2011 08:36 PM  Carmelina Dane, [phone]

## 2011-11-17 NOTE — Discharge Instructions (Signed)
YOU HAD AN ENDOSCOPIC PROCEDURE TODAY: Refer to the procedure report that was given to you for any specific questions about what was found during the examination.  If the procedure report does not answer your questions, please call your gastroenterologist to clarify.  YOU SHOULD EXPECT: Some feelings of bloating in the abdomen. Passage of more gas than usual.  Walking can help get rid of the air that was put into your GI tract during the procedure and reduce the bloating. If you had a lower endoscopy (such as a colonoscopy or flexible sigmoidoscopy) you may notice spotting of blood in your stool or on the toilet paper.   DIET: Your first meal following the procedure should be a light meal and then it is ok to progress to your normal diet.  A half-sandwich or bowl of soup is an example of a good first meal.  Heavy or fried foods are harder to digest and may make you feel nasueas or bloated.  Drink plenty of fluids but you should avoid alcoholic beverages for 24 hours.  ACTIVITY: Your care partner should take you home directly after the procedure.  You should plan to take it easy, moving slowly for the rest of the day.  You can resume normal activity the day after the procedure however you should NOT DRIVE or use heavy machinery for 24 hours (because of the sedation medicines used during the test).    SYMPTOMS TO REPORT IMMEDIATELY  A gastroenterologist can be reached at any hour.  Please call your doctor's office for any of the following symptoms:   Following lower endoscopy (colonoscopy, flexible sigmoidoscopy)  Excessive amounts of blood in the stool  Significant tenderness, worsening of abdominal pains  Swelling of the abdomen that is new, acute  Fever of 100 or higher  Following upper endoscopy (EGD, EUS, ERCP)  Vomiting of blood or coffee ground material  New, significant abdominal pain  New, significant chest pain or pain under the shoulder blades  Painful or persistently difficult  swallowing  New shortness of breath  Black, tarry-looking stools  FOLLOW UP: Dr. Christella Hartigan office will arrange referral to bariatric surgeon to consider removing your lap band You should chew your food well, eat slowly and take small bites.

## 2011-11-17 NOTE — Consult Note (Signed)
EGD with Dr. Gessner 01/2008; no food in esophagus, +food in proximal stomach  EGD with Dr. Stark 04/2008: retained food in distal esophagus then free text "meat impaction at lap band site pushed gently into the stomach" EGD Dr. Keeghan Bialy 06/2010: Lap Band narrowing in proximal stomach. Large food bolus in distal stomach was likely impacted at lap band site and passed just prior to EGD, perhaps from sedation given for the procedure.  Barium esophagram 09/2010: 1. No fixed esophageal strictures or masses. No evidence of hiatal hernia. Gastroesophageal reflux was not elicited. 2. No evidence of hiatal hernia. 3. Normal esophageal peristalsis. 4. Delayed gastric emptying without evidence of gastric outlet obstruction. 5. Mild, non-obstructive indentation of the posterior cervical esophagus at the C5 level due to an anterior osteophyte. 6. Appropriately oriented laparoscopic band. As the band was decompressed, no evidence of obstruction to the flow of thin barium or the 12.5 mm barium tablet by the band.  Gastric emptying scan 10/2010: slightly delayed gastric emptying scan  Esophageal manometry scheduled but she never went 2012  HPI:  This is a Woman whom I last saw in office about a year ago. She has chronic dysphagia to solids for years. Has to chew food well, intermittently regurge or vomit. She has presented to ER for "food impaction" 3 times previously but food has never been caught in eosphagus, rather always seems to be hung in stomach at site of lap band. Her bariatric surgeon has removed all of the fluid from the band but the band is still in place. Today, presented to ER with "food caught", steak since last night. She does not have a spit cup but tells me that she cannot swallow more than 4-5 times without having to vomit.  Review of systems:  Pertinent positive and negative review of systems were noted in the above HPI section. Complete review of systems was performed and was otherwise normal.  Past  Medical History   Diagnosis  Date   .  COPD (chronic obstructive pulmonary disease)    .  Diabetes mellitus    .  GERD (gastroesophageal reflux disease)    .  Arthritis    .  Obesity     Past Surgical History   Procedure  Date   .  Laparoscopic gastric banding    .  Hernia repair    .  Tubal ligation    .  Bladder surgery     No current facility-administered medications for this encounter.    Current Outpatient Prescriptions   Medication  Sig  Dispense  Refill   .  glyBURIDE-metformin (GLUCOVANCE) 5-500 MG per tablet  Take 2 tablets by mouth at bedtime.      Allergies as of 11/17/2011 - Review Complete 11/17/2011   Allergen  Reaction  Noted   .  Cephalexin   10/02/2010   .  Ciprofloxacin hcl   10/02/2010   .  Penicillins   10/02/2010   .  Sulfa antibiotics   10/02/2010    Family History   Problem  Relation  Age of Onset   .  Colon cancer  Neg Hx    .  Breast cancer  Mother    .  Diabetes  Mother       Sister and Father    .  Heart disease  Father       and Sister    .  Cirrhosis  Father       Non-alcholic    History      Social History   .  Marital Status:  Married     Spouse Name:  N/A     Number of Children:  4   .  Years of Education:  N/A    Occupational History   .  Disabled     Social History Main Topics   .  Smoking status:  Former Smoker     Quit date:  11/16/2005   .  Smokeless tobacco:  Never Used   .  Alcohol Use:  Yes      occasion   .  Drug Use:  No   .  Sexually Active:  Not on file    Other Topics  Concern   .  Not on file    Social History Narrative    2 caffeine drinks daily    Physical Exam:  BP 128/56  Pulse 85  Temp 97.7 F (36.5 C) (Oral)  Resp 19  SpO2 97%  Constitutional: generally well-appearing  Psychiatric: alert and oriented x3  Eyes: extraocular movements intact  Mouth: oral pharynx moist, no lesions  Neck: supple no lymphadenopathy  Cardiovascular: heart regular rate and rhythm  Lungs: clear to auscultation  bilaterally  Abdomen: soft, nontender, nondistended, no obvious ascites, no peritoneal signs, normal bowel sounds  Extremities: no lower extremity edema bilaterally  Skin: no lesions on visible extremities  Assessment and plan:  46 y.o. female with Chronic swallowing difficulty, not clear if this is an esophageal issue or (as suspected by her previous 3 EGDs) food is hanging at site of lap band.  Will proceed with EGD in ER. I don't think she has food caught in esophagus, she has no spit cup, talking comfortably, but I am very interested to see where the food bolus is.     

## 2011-11-19 ENCOUNTER — Telehealth: Payer: Self-pay

## 2011-11-19 ENCOUNTER — Encounter (HOSPITAL_COMMUNITY): Payer: Self-pay | Admitting: Gastroenterology

## 2011-11-19 DIAGNOSIS — R1310 Dysphagia, unspecified: Secondary | ICD-10-CM

## 2011-11-19 DIAGNOSIS — R933 Abnormal findings on diagnostic imaging of other parts of digestive tract: Secondary | ICD-10-CM

## 2011-11-19 NOTE — Telephone Encounter (Signed)
Pt aware and referral sent to [facility]. 

## 2011-11-19 NOTE — Telephone Encounter (Signed)
Message copied by Donata Duff on Mon Nov 19, 2011  8:56 AM ------      Message from: Rachael Fee      Created: Sat Nov 17, 2011  8:40 PM       Terri [name],            She needs referral to bariatric surgeon to consider REMOVAL of lap band, it is causing chronic dysphagia and repeated food impactions.            Thanks

## 2011-11-19 NOTE — Telephone Encounter (Signed)
Pt aware and will have records sent to [facility] for  review

## 2011-11-19 NOTE — Telephone Encounter (Signed)
Message copied by Donata Duff on Mon Nov 19, 2011 10:33 AM ------      Message from: Marnette Burgess      Created: Mon Nov 19, 2011  9:48 AM       [facility], sorry, but still we will need all her surgical records from that procedure faxed to [facility] to [phone], she will then present them to the Bariatric surgeons and see if one of them is willing to take her on as a patient.  This process will take approximately 2 weeks.  If one of our surgeons is willing to take her on as a patient, she will contact the patient with an appointment.            Thank You,      Elane Fritz      ----- Message -----         From: Donata Duff, CMA         Sent: 11/19/2011   9:34 AM           To: Marnette Burgess            [place] this is to have the lap band removed she is having dysphagia and food impaction.  [place] state.      ----- Message -----         From: Marnette Burgess         Sent: 11/19/2011   9:18 AM           To: Donata Duff, CMA            Addelynn Batte,                For bariatric referrals, it's a totally different process, patients need to call [phone] to register themselves for the free weight loss seminar that our facility offers.  At that seminar our surgeons are the speaks and will discuss the different types of surgeries that we off along with insurance information.  If the patient decides to proceed they will be instructed on what to do next. These referrals go to [facility].  If you have any question please call [phone].            Thank You,      Elane Fritz      ----- Message -----         From: Donata Duff, CMA         Sent: 11/19/2011   9:00 AM           To: Marnette Burgess            Pt needs appt with bariatric surgeon to discuss lap band removal because of dysphagia and food impactions

## 2012-01-02 ENCOUNTER — Encounter (HOSPITAL_COMMUNITY): Payer: Self-pay | Admitting: *Deleted

## 2012-01-02 ENCOUNTER — Encounter (HOSPITAL_COMMUNITY)
Admission: EM | Disposition: A | Discharge status: Home / Self Care | Payer: Self-pay | Source: Home / Self Care | Attending: Emergency Medicine

## 2012-01-02 ENCOUNTER — Emergency Department (HOSPITAL_COMMUNITY)
Admission: EM | Admit: 2012-01-02 | Discharge: 2012-01-02 | Disposition: A | Discharge status: Home / Self Care | Payer: Medicare Other | Attending: Emergency Medicine | Admitting: Emergency Medicine

## 2012-01-02 DIAGNOSIS — E119 Type 2 diabetes mellitus without complications: Secondary | ICD-10-CM | POA: Insufficient documentation

## 2012-01-02 DIAGNOSIS — K9509 Other complications of gastric band procedure: Secondary | ICD-10-CM | POA: Insufficient documentation

## 2012-01-02 DIAGNOSIS — J4489 Other specified chronic obstructive pulmonary disease: Secondary | ICD-10-CM | POA: Insufficient documentation

## 2012-01-02 DIAGNOSIS — R1310 Dysphagia, unspecified: Secondary | ICD-10-CM

## 2012-01-02 DIAGNOSIS — E669 Obesity, unspecified: Secondary | ICD-10-CM | POA: Insufficient documentation

## 2012-01-02 DIAGNOSIS — R933 Abnormal findings on diagnostic imaging of other parts of digestive tract: Secondary | ICD-10-CM

## 2012-01-02 DIAGNOSIS — T182XXA Foreign body in stomach, initial encounter: Secondary | ICD-10-CM

## 2012-01-02 DIAGNOSIS — Y831 Surgical operation with implant of artificial internal device as the cause of abnormal reaction of the patient, or of later complication, without mention of misadventure at the time of the procedure: Secondary | ICD-10-CM | POA: Insufficient documentation

## 2012-01-02 DIAGNOSIS — IMO0002 Reserved for concepts with insufficient information to code with codable children: Secondary | ICD-10-CM | POA: Insufficient documentation

## 2012-01-02 DIAGNOSIS — K219 Gastro-esophageal reflux disease without esophagitis: Secondary | ICD-10-CM | POA: Insufficient documentation

## 2012-01-02 DIAGNOSIS — J449 Chronic obstructive pulmonary disease, unspecified: Secondary | ICD-10-CM | POA: Insufficient documentation

## 2012-01-02 DIAGNOSIS — Z79899 Other long term (current) drug therapy: Secondary | ICD-10-CM | POA: Insufficient documentation

## 2012-01-02 HISTORY — PX: ESOPHAGOGASTRODUODENOSCOPY: SHX5428

## 2012-01-02 LAB — GLUCOSE, CAPILLARY: Glucose-Capillary: 131 mg/dL — ABNORMAL HIGH (ref 70–99)

## 2012-01-02 SURGERY — EGD (ESOPHAGOGASTRODUODENOSCOPY)
Anesthesia: Moderate Sedation

## 2012-01-02 MED ORDER — FENTANYL CITRATE 0.05 MG/ML IJ SOLN
INTRAMUSCULAR | Status: DC | PRN
  Administered 2012-01-02 (×3): 25 ug via INTRAVENOUS

## 2012-01-02 MED ORDER — FENTANYL CITRATE 0.05 MG/ML IJ SOLN
INTRAMUSCULAR | Status: AC
  Filled 2012-01-02: qty 2

## 2012-01-02 MED ORDER — MIDAZOLAM HCL 10 MG/2ML IJ SOLN
INTRAMUSCULAR | Status: AC
  Filled 2012-01-02: qty 2

## 2012-01-02 MED ORDER — DIPHENHYDRAMINE HCL 50 MG/ML IJ SOLN
INTRAMUSCULAR | Status: AC
  Filled 2012-01-02: qty 1

## 2012-01-02 MED ORDER — BUTAMBEN-TETRACAINE-BENZOCAINE 2-2-14 % EX AERO
INHALATION_SPRAY | CUTANEOUS | Status: DC | PRN
  Administered 2012-01-02: 2 via TOPICAL

## 2012-01-02 MED ORDER — MIDAZOLAM HCL 10 MG/2ML IJ SOLN
INTRAMUSCULAR | Status: DC | PRN
  Administered 2012-01-02 (×4): 2 mg via INTRAVENOUS

## 2012-01-02 NOTE — ED Notes (Deleted)
To ED for eval of 'food not going down'. States she was eating Monday when she became choked and 'had to throw it up'. States since she can't swallow any food with pain. Also has many GI complaints: diarrhea, constipation

## 2012-01-02 NOTE — H&P (Signed)
  HPI: This is a woman with multiple food impactions since lap band placed.  Food has always been found to be lodged AT THE BAND SITE, never in eosphagus.  She needs the lap band removed, is working on getting in with bariatric program locally.    Past Medical History  Diagnosis Date  . COPD (chronic obstructive pulmonary disease)   . Diabetes mellitus   . GERD (gastroesophageal reflux disease)   . Arthritis   . Obesity     Past Surgical History  Procedure Date  . Laparoscopic gastric banding   . Hernia repair   . Tubal ligation   . Bladder surgery   . Esophagogastroduodenoscopy 11/17/2011    Procedure: ESOPHAGOGASTRODUODENOSCOPY (EGD);  Surgeon: Rachael Fee, MD;  Location: [facility] ENDOSCOPY;  Service: Endoscopy;  Laterality: N/A;    No current facility-administered medications for this encounter.    Allergies as of 01/02/2012 - Review Complete 01/02/2012  Allergen Reaction Noted  . Sulfa antibiotics Other (See Comments) 10/02/2010  . Cephalexin Itching and Rash 10/02/2010  . Ciprofloxacin hcl Itching and Rash 10/02/2010  . Penicillins Itching and Rash 10/02/2010    Family History  Problem Relation Age of Onset  . Colon cancer Neg Hx   . Breast cancer Mother   . Diabetes Mother     Sister and Father  . Heart disease Father     and Sister  . Cirrhosis Father     Non-alcholic     History   Social History  . Marital Status: Married    Spouse Name: N/A    Number of Children: 4  . Years of Education: N/A   Occupational History  . Disabled     Social History Main Topics  . Smoking status: Former Smoker    Quit date: 11/16/2005  . Smokeless tobacco: Never Used  . Alcohol Use: Yes     occasion  . Drug Use: No  . Sexually Active: Not on file   Other Topics Concern  . Not on file   Social History Narrative   2 caffeine drinks daily       Physical Exam: BP 138/68  Pulse 67  Temp 98.9 F (37.2 C) (Oral)  Resp 17  Ht 5\' 6"  (1.676 m)  Wt 252 lb  (114.306 kg)  BMI 40.67 kg/m2  SpO2 99% Constitutional: generally well-appearing Psychiatric: alert and oriented x3 Abdomen: soft, nontender, nondistended, no obvious ascites, no peritoneal signs, normal bowel sounds     Assessment and plan: 46 y.o. female with another food impaction  egd now

## 2012-01-02 NOTE — ED Provider Notes (Signed)
History    This chart was scribed for Toy Baker, MD, MD by Smitty Pluck. The patient was seen in room TR11C and the patient's care was started at 11:49AM.   CSN: [id number]  Arrival date & time 01/02/12  1019   None     Chief Complaint  Patient presents with  . Sore Throat    (Consider location/radiation/quality/duration/timing/severity/associated sxs/prior treatment) The history is provided by the patient.   Terri [name] is a 46 y.o. female who presents to the Emergency Department complaining of food stuck in esophagus onset 1 day ago. Pt reports that she had chicken for lunch yesterday right before onset. She states that she can not swallow any food. Pt reports that she has a hx of food getting stuck and this feels similar to past incidents. Pt reports that she has vomited. In the past sleep has helped by relaxing the esophagus muscles. Pt reports having lap band done in [place] and has had EGD 4x for esophageal food impaction (most recent November 17, 2011).  She still has to get her medical files transferred here.     Past Medical History  Diagnosis Date  . COPD (chronic obstructive pulmonary disease)   . Diabetes mellitus   . GERD (gastroesophageal reflux disease)   . Arthritis   . Obesity     Past Surgical History  Procedure Date  . Laparoscopic gastric banding   . Hernia repair   . Tubal ligation   . Bladder surgery   . Esophagogastroduodenoscopy 11/17/2011    Procedure: ESOPHAGOGASTRODUODENOSCOPY (EGD);  Surgeon: Rachael Fee, MD;  Location: [facility] ENDOSCOPY;  Service: Endoscopy;  Laterality: N/A;    Family History  Problem Relation Age of Onset  . Colon cancer Neg Hx   . Breast cancer Mother   . Diabetes Mother     Sister and Father  . Heart disease Father     and Sister  . Cirrhosis Father     Non-alcholic     History  Substance Use Topics  . Smoking status: Former Smoker    Quit date: 11/16/2005  . Smokeless tobacco: Never Used  . Alcohol Use:  Yes     occasion    OB History    Grav Para Term Preterm Abortions TAB SAB Ect Mult Living                  Review of Systems  Constitutional: Negative for fever and chills.  Respiratory: Negative for shortness of breath.   Gastrointestinal: Negative for nausea and vomiting.  Neurological: Negative for weakness.  All other systems reviewed and are negative.    Allergies  Cephalexin; Ciprofloxacin hcl; Penicillins; and Sulfa antibiotics  Home Medications   Current Outpatient Rx  Name Route Sig Dispense Refill  . GLYBURIDE-METFORMIN 5-500 MG PO TABS Oral Take 2 tablets by mouth at bedtime.       BP 118/66  Pulse 67  Temp 98 F (36.7 C) (Oral)  Resp 20  SpO2 97%  Physical Exam  Nursing note and vitals reviewed. Constitutional: She is oriented to person, place, and time. She appears well-developed and well-nourished. No distress.  HENT:  Head: Normocephalic and atraumatic.  Eyes: Conjunctivae are normal.  Neck: Normal range of motion. Neck supple.  Pulmonary/Chest: Effort normal and breath sounds normal.  Neurological: She is alert and oriented to person, place, and time.  Skin: Skin is warm and dry.  Psychiatric: She has a normal mood and affect. Her behavior is normal.  ED Course  Procedures (including critical care time) DIAGNOSTIC STUDIES: Oxygen Saturation is 97% on room air, normal by my interpretation.    COORDINATION OF CARE:    Labs Reviewed - No data to display No results found.   No diagnosis found.    MDM  Will speak with gi and they will come to scope   I personally performed the services described in this documentation, which was scribed in my presence. The recorded information has been reviewed and considered.       Toy Baker, MD 01/02/12 [phone]

## 2012-01-02 NOTE — Op Note (Signed)
[name] [address] [place], [place]  [address]  ENDOSCOPY PROCEDURE REPORT  PATIENT:  Terri [name], Terri [name]  MR#:  [id number] BIRTHDATE:  1965-07-31, 45 yrs. old  GENDER:  female  ENDOSCOPIST:  Rachael Fee, MD Referred by:  PROCEDURE DATE:  01/02/2012 PROCEDURE:  EGD with foreign body removal ASA CLASS:  Class III INDICATIONS:  EGD with Dr. Leone Payor 01/2008; no food in esophagus, +food in proximal stomach EGD with Dr. Russella Dar 04/2008: "meat impaction at lap band site pushed gently into the stomach" EGD Dr. Christella Hartigan 06/2010: Lap Band narrowing in proximal stomach. Large food bolus in distal stomach was likely impacted at lap band site and passed just prior to EGD, perhaps from sedation given for the procedure. EGD Dr. Christella Hartigan [id number]: impacted food clearly at Lap Band site, pushed into stomach  MEDICATIONS:   Fentanyl 75 mcg IV, Versed 8 mg IV TOPICAL ANESTHETIC:  DESCRIPTION OF PROCEDURE:   After the risks benefits and alternatives of the procedure were thoroughly explained, informed consent was obtained.  The EG-[id number] ([id number]) endoscope was introduced through the mouth and advanced to the stomach body, without limitations.  The instrument was slowly withdrawn as the mucosa was fully examined. <<PROCEDUREIMAGES>>  There was a bolus of chicken impacted about 1cm BELOW the GE junction. See the very clear pictures. This food is impacted at the Lap Band site and it was gently pushed into the stomach (see image001, image003, and image004).    Retroflexed views revealed no abnormalities.    The scope was then withdrawn from the patient and the procedure completed.  COMPLICATIONS:  None  ENDOSCOPIC IMPRESSION: 1) Meat impaction at lap band site.  RECOMMENDATIONS: You should continue to get records sent to local bariatric surgery program to remove your lap band. Even though it is empty of fluid it continues to cause daily dsyphagia and now 5  food impactions in 4years.  My office will get in touch to try to facilitate this.  You need to continue to take small bites, eat slowly and chew your food very well.  ______________________________ Rachael Fee, MD  n. eSIGNED:   Rachael Fee at 01/02/2012 01:11 PM  Carmelina Dane, [phone]

## 2012-01-02 NOTE — ED Notes (Signed)
To ED for eval of 'food stuck' in throat. States she has had this before and had to have esophagus stretched. Airway patent.

## 2012-01-03 ENCOUNTER — Encounter (HOSPITAL_COMMUNITY): Payer: Self-pay | Admitting: Gastroenterology

## 2012-01-16 ENCOUNTER — Ambulatory Visit (INDEPENDENT_AMBULATORY_CARE_PROVIDER_SITE_OTHER): Payer: Self-pay | Admitting: Surgery

## 2012-01-28 ENCOUNTER — Telehealth (INDEPENDENT_AMBULATORY_CARE_PROVIDER_SITE_OTHER): Payer: Self-pay | Admitting: General Surgery

## 2012-01-28 ENCOUNTER — Encounter (INDEPENDENT_AMBULATORY_CARE_PROVIDER_SITE_OTHER): Payer: Self-pay | Admitting: Surgery

## 2012-01-28 ENCOUNTER — Other Ambulatory Visit (INDEPENDENT_AMBULATORY_CARE_PROVIDER_SITE_OTHER): Payer: Self-pay | Admitting: Surgery

## 2012-01-28 ENCOUNTER — Ambulatory Visit (INDEPENDENT_AMBULATORY_CARE_PROVIDER_SITE_OTHER): Payer: PRIVATE HEALTH INSURANCE | Admitting: Surgery

## 2012-01-28 VITALS — BP 100/60 | HR 68 | Temp 97.8°F | Resp 16 | Ht 66.0 in | Wt 257.6 lb

## 2012-01-28 DIAGNOSIS — Z9884 Bariatric surgery status: Secondary | ICD-10-CM

## 2012-01-28 DIAGNOSIS — E119 Type 2 diabetes mellitus without complications: Secondary | ICD-10-CM

## 2012-01-28 DIAGNOSIS — R1310 Dysphagia, unspecified: Secondary | ICD-10-CM

## 2012-01-28 NOTE — Telephone Encounter (Signed)
Spoke with [facility] medical center's bariatric program and requested over medical records for this pt.  They said they would leave the message with the medical records department and have them faxed.

## 2012-01-28 NOTE — Progress Notes (Signed)
Chief Complaint:  Recurrent solid food impaction after lapband  History of Present Illness:  Terri [name] is an 46 y.o. female referred by Dr. Christella Hartigan. She underwent a laparoscopic adjustable gastric banding procedure in detail for it in 2009. Her reason for doing that is really unclear because we were a center of excellence in  2009.  She says that they nicked her spleen and had some problems with bleeding during performance the band require her to stay in the hospital but longer. Their only instructions she recalls that she is was told that she could need anything that she can grill. She is not had any bariatric followup in no dietary instruction.  I went ahead and accessed her band port which is on the right side medial to her incision. Removed another 1 cc of fluid from her band.  Her review her upper GI series that was done about a year ago and although it didn't showing band slippage I. White repeat that. I also like her to have a dietary consult to try to give her better insight. It is clear to me that she is maladaptive eating relying on potato chips, ice cream, etc.   Past Medical History  Diagnosis Date  . COPD (chronic obstructive pulmonary disease)   . Diabetes mellitus   . GERD (gastroesophageal reflux disease)   . Arthritis   . Obesity   . Asthma   . Osteoporosis     Past Surgical History  Procedure Date  . Laparoscopic gastric banding   . Hernia repair   . Tubal ligation   . Bladder surgery   . Esophagogastroduodenoscopy 11/17/2011    Procedure: ESOPHAGOGASTRODUODENOSCOPY (EGD);  Surgeon: Rachael Fee, MD;  Location[facility] ENDOSCOPY;  Service: Endoscopy;  Laterality: N/A;  . Esophagogastroduodenoscopy 01/02/2012    Procedure: ESOPHAGOGASTRODUODENOSCOPY (EGD);  Surgeon: Rachael Fee, MD;  Location: [facility] ENDOSCOPY;  Service: Endoscopy;  Laterality: N/A;    Current Outpatient Prescriptions  Medication Sig Dispense Refill  . glyBURIDE-metformin (GLUCOVANCE) 5-500 MG per  tablet Take 2 tablets by mouth at bedtime.       Marland Kitchen ipratropium-albuterol (DUONEB) 0.5-2.5 (3) MG/3ML SOLN       . omeprazole (PRILOSEC) 20 MG capsule        Sulfa antibiotics; Cephalexin; Ciprofloxacin hcl; and Penicillins Family History  Problem Relation Age of Onset  . Colon cancer Neg Hx   . Breast cancer Mother   . Diabetes Mother     Sister and Father  . Cancer Mother     breast  . COPD Mother   . Heart disease Father     and Sister  . Cirrhosis Father     Non-alcholic   . Diabetes Father   . Hypertension Father   . Kidney disease Father     chronic kidney stones  . Diabetes Sister   . Diabetes Brother    Social History:   reports that she quit smoking about 6 years ago. She has quit using smokeless tobacco. She reports that she drinks alcohol. She reports that she does not use illicit drugs.   REVIEW OF SYSTEMS - PERTINENT POSITIVES ONLY: Nothing new  Physical Exam:   Blood pressure 100/60, pulse 68, temperature 97.8 F (36.6 C), temperature source Temporal, resp. rate 16, height 5\' 6"  (1.676 m), weight 257 lb 9.6 oz (116.847 kg). Body mass index is 41.58 kg/(m^2).  Gen:  WDWN WF NAD  Neurological: Alert and oriented to person, place, and time. Motor and sensory function is  grossly intact  Head: Normocephalic and atraumatic.  Eyes: Conjunctivae are normal. Pupils are equal, round, and reactive to light. No scleral icterus.  Neck: Normal range of motion. Neck supple. No tracheal deviation or thyromegaly present.  Cardiovascular:  SR without murmurs or gallops.  No carotid bruits Respiratory: Effort normal.  No respiratory distress. No chest wall tenderness. Breath sounds normal.  No wheezes, rales or rhonchi.  Abdomen:  Port accessed.  Incisions healed GU: Musculoskeletal: Normal range of motion. Extremities are nontender. No cyanosis, edema or clubbing noted Lymphadenopathy: No cervical, preauricular, postauricular or axillary adenopathy is present Skin: Skin is  warm and dry. No rash noted. No diaphoresis. No erythema. No pallor. Pscyh: Normal mood and affect. Behavior is normal. Judgment and thought content normal.   LABORATORY RESULTS: No results found for this or any previous visit (from the past 48 hour(s)).  RADIOLOGY RESULTS: No results found.  Problem List: Patient Active Problem List  Diagnosis  . Dysphagia  . Nonspecific (abnormal) findings on radiological and other examination of gastrointestinal tract  . Foreign body in stomach    Assessment & Plan: LAGB done in [place] with no bariatric followup.  Will assess with UGI and have dietary consult.  May require band revision.  Patient would like to keep her band.  I provided some rudimentary instruction on how to eat and what to eat and we may be able to get her on the right track without just removing her lapband.    Matt B. Daphine Deutscher, MD, [facility] [phone] beeper [phone]  01/28/2012 2:51 PM

## 2012-01-28 NOTE — Patient Instructions (Signed)

## 2012-01-28 NOTE — Telephone Encounter (Signed)
LMOM letting pt know that her next appt with Dr. Daphine Deutscher will be on 9/11 at 1:40.

## 2012-01-31 ENCOUNTER — Ambulatory Visit (HOSPITAL_COMMUNITY)
Admission: RE | Admit: 2012-01-31 | Discharge: 2012-01-31 | Disposition: A | Discharge status: Home / Self Care | Payer: PRIVATE HEALTH INSURANCE | Source: Ambulatory Visit | Attending: Surgery | Admitting: Surgery

## 2012-01-31 ENCOUNTER — Ambulatory Visit: Payer: Medicaid Other | Admitting: *Deleted

## 2012-01-31 ENCOUNTER — Other Ambulatory Visit (INDEPENDENT_AMBULATORY_CARE_PROVIDER_SITE_OTHER): Payer: Self-pay | Admitting: Surgery

## 2012-01-31 DIAGNOSIS — R1310 Dysphagia, unspecified: Secondary | ICD-10-CM

## 2012-01-31 DIAGNOSIS — Z9884 Bariatric surgery status: Secondary | ICD-10-CM

## 2012-02-06 ENCOUNTER — Telehealth (INDEPENDENT_AMBULATORY_CARE_PROVIDER_SITE_OTHER): Payer: Self-pay | Admitting: General Surgery

## 2012-02-06 NOTE — Telephone Encounter (Signed)
Spoke with pt and informed her that her UGI came back normal and that we will bring her back in the office on 9/11 to discuss where to go from here.  She informed me that she missed her nutrition appt due to being sick from the UGI and she asked if we could get that rescheduled for her.  I have sent a message to Demita asking if this is possible and am now waiting to hear anything.

## 2012-02-13 ENCOUNTER — Encounter (INDEPENDENT_AMBULATORY_CARE_PROVIDER_SITE_OTHER): Payer: Self-pay | Admitting: Surgery

## 2012-02-13 ENCOUNTER — Ambulatory Visit (INDEPENDENT_AMBULATORY_CARE_PROVIDER_SITE_OTHER): Payer: PRIVATE HEALTH INSURANCE | Admitting: Surgery

## 2012-02-13 VITALS — BP 108/72 | HR 70 | Temp 97.4°F | Resp 16 | Ht 66.0 in | Wt 258.2 lb

## 2012-02-13 DIAGNOSIS — Z4651 Encounter for fitting and adjustment of gastric lap band: Secondary | ICD-10-CM

## 2012-02-13 DIAGNOSIS — Z9884 Bariatric surgery status: Secondary | ICD-10-CM

## 2012-02-13 NOTE — Progress Notes (Signed)
Terri [name] Body mass index is 41.67 kg/(m^2).  Having regurgitation:  no  Nocturnal reflux?  no  Amount of fill  0.5 Was having food impactions.  UGI OK.  Added .5 cc to band today.  Has missed dietary appts because of grandchildren births.    May be eating too fast.  Will see back in 6 weeks.

## 2012-02-13 NOTE — Patient Instructions (Signed)

## 2012-02-21 ENCOUNTER — Ambulatory Visit: Payer: Medicaid Other | Admitting: *Deleted

## 2012-03-10 ENCOUNTER — Encounter: Payer: PRIVATE HEALTH INSURANCE | Attending: Surgery | Admitting: *Deleted

## 2012-03-10 ENCOUNTER — Encounter: Payer: Self-pay | Admitting: *Deleted

## 2012-03-10 DIAGNOSIS — Z9884 Bariatric surgery status: Secondary | ICD-10-CM | POA: Insufficient documentation

## 2012-03-10 DIAGNOSIS — Z713 Dietary counseling and surveillance: Secondary | ICD-10-CM | POA: Insufficient documentation

## 2012-03-10 DIAGNOSIS — Z09 Encounter for follow-up examination after completed treatment for conditions other than malignant neoplasm: Secondary | ICD-10-CM | POA: Insufficient documentation

## 2012-03-10 NOTE — Progress Notes (Addendum)
  Bariatric Surgery Visit:  44yrs 8 mos Post-Operative LAGB Surgery  Medical Nutrition Therapy:  Appt start time: 1030 end time:  1130.  Primary concerns today: Post-operative Bariatric Surgery LAGB Refresher. Terri [name] is here today for band refresher. Surgery performed in [place], [place] in 07/2007 and has not had nutrition f/u since.  States Dr. Daphine Deutscher removed all her fluid in August 2013 and added 0.5 cc back last month; is in the "yellow zone".  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 This, coupled with eating too fast d/t meal skipping, likely cause of issues with food lodging in throat. Reports a weakness for sweets and chips; also eats most meals away from home.  Increased portions of CHO and fried foods noted.  Will focus initially on decreasing CHO portions and increasing exercise. Will see her back in 4 weeks. States an understanding of all goals.   Surgery date: 07/2007 [facility] [place], [place]) Start weight: Unknown  Weight today: 258.5 lbs Weight change: n/a Total weight lost: n/a BMI: 41.7 kg/m^2 Weight goal: Unknown % Weight goal met: Unknown  24-hr recall: Reports she eats meat, potatoes, corn, and green beans the most. Increased portions of CHO and fried foods noted.  B (AM): SKIPS or 3 fried eggs & 2 pcs toast - eats breakfast only 2 times/week Snk (AM): None  L (11-1 PM): Hamburger steak  Snk (PM): None  D (PM): Pork 'n beans (1/2 can) with (2) hotdog Snk (PM): Sweets  Fluid intake: <64 oz Estimated total protein intake: 60-80 g  Medications: Only taking Glucovance at this time; takes only at hs d/t low BGs during day (Advised to contact PCP) Supplementation:  None - Discussed importance of resuming  CBG monitoring: ~1 time/week and when "low" Last FBG per patient: 138 mg/mL Last patient reported A1c: Unknown  Using straws: No Drinking while eating: No, but drinks after meals. Discussed waiting 30 min Hair loss: None  reported Carbonated beverages:  Soda 1x/mo N/V/D/C: None reported  Last Lap-Band fill:  02/13/12; 0.5 cc - in "yellow" zone  Recent physical activity:  Inconsistent at this time.  Progress Towards Goal(s):  In progress.  Handouts given during visit include:  Bariatric Surgery Specialized Post-Op Diet   Bariatric Surgery Fast Food Guide  Meal Planning card  Vitamins & Minerals (post LAGB)  Exercise (post LAGB)   Nutritional Diagnosis:  [place]-3.3 Overweight/obesity As related to excessive portions of CHO/high fat foods s/p LAGB.  As evidenced by patient-reported food history and a lack of weight loss.    Intervention:  Nutrition education/reinforcement.  Monitoring/Evaluation:  Dietary intake, exercise, lap band fills, and body weight. Follow up in 1 month for post-op visit.

## 2012-03-10 NOTE — Patient Instructions (Addendum)
Goals:  Follow Bariatric Surgery Specialized Post-Op Diet  Watch carb portions, eat slowly  Avoid fried foods  Eat 3-6 small meals/snacks, or every 2-3 hrs  Increase lean protein foods to meet 60-80g goal  Increase fluid intake to 64oz +  Avoid drinking 15 minutes before, during and 30 minutes after eating  Aim for >30 min of physical activity daily  **Do what you can to get your dentures fixed. Should help with slowing down your eating and keep food from getting stuck.

## 2012-03-20 ENCOUNTER — Emergency Department (HOSPITAL_COMMUNITY)
Admission: EM | Admit: 2012-03-20 | Discharge: 2012-03-20 | Disposition: A | Discharge status: Home / Self Care | Payer: PRIVATE HEALTH INSURANCE | Attending: Emergency Medicine | Admitting: Emergency Medicine

## 2012-03-20 ENCOUNTER — Encounter (HOSPITAL_COMMUNITY): Payer: Self-pay | Admitting: Emergency Medicine

## 2012-03-20 ENCOUNTER — Telehealth (INDEPENDENT_AMBULATORY_CARE_PROVIDER_SITE_OTHER): Payer: Self-pay

## 2012-03-20 DIAGNOSIS — Z87891 Personal history of nicotine dependence: Secondary | ICD-10-CM | POA: Insufficient documentation

## 2012-03-20 DIAGNOSIS — J4489 Other specified chronic obstructive pulmonary disease: Secondary | ICD-10-CM | POA: Insufficient documentation

## 2012-03-20 DIAGNOSIS — M129 Arthropathy, unspecified: Secondary | ICD-10-CM | POA: Insufficient documentation

## 2012-03-20 DIAGNOSIS — E669 Obesity, unspecified: Secondary | ICD-10-CM | POA: Insufficient documentation

## 2012-03-20 DIAGNOSIS — M810 Age-related osteoporosis without current pathological fracture: Secondary | ICD-10-CM | POA: Insufficient documentation

## 2012-03-20 DIAGNOSIS — E119 Type 2 diabetes mellitus without complications: Secondary | ICD-10-CM | POA: Insufficient documentation

## 2012-03-20 DIAGNOSIS — K219 Gastro-esophageal reflux disease without esophagitis: Secondary | ICD-10-CM | POA: Insufficient documentation

## 2012-03-20 DIAGNOSIS — Z9884 Bariatric surgery status: Secondary | ICD-10-CM | POA: Insufficient documentation

## 2012-03-20 DIAGNOSIS — J449 Chronic obstructive pulmonary disease, unspecified: Secondary | ICD-10-CM | POA: Insufficient documentation

## 2012-03-20 DIAGNOSIS — R1310 Dysphagia, unspecified: Secondary | ICD-10-CM | POA: Insufficient documentation

## 2012-03-20 NOTE — Telephone Encounter (Signed)
Patient calling office to report that she has a piece of chicken stuck in her throat.  Patient said "this happens all the time, I don't chew my food very well because I don't have back teeth"  Advised patient to drink liquids this evening and we will have her come to the office in the morning to remove fluid from her lap band.  Patient states she cannot swallow her spit.  Due to patient having a significant history of dysphagia and food impaction since 2010, she has been advised to go to [facility] for further evaluation.  Will route message to Dr. Ezzard Standing ([facility] pm call)

## 2012-03-20 NOTE — ED Notes (Signed)
Patient has a lap band and feels like it's obstructed with food.  Symptoms began after lunch today.

## 2012-03-20 NOTE — ED Notes (Signed)
Pt stated that she felt like her food has passed and is able to go home.  Does not want to see Dr. Ezzard Standing.

## 2012-03-20 NOTE — ED Notes (Addendum)
Pt states she called Dr. Daphine Deutscher and told her that her food does not appear to be going down. It was too late for an appt so they told her to come here. She has a lap ban, this has happened before and they just emptied her fill.

## 2012-03-20 NOTE — ED Provider Notes (Signed)
History     CSN: [id number]  Arrival date & time 03/20/12  1644   None     Chief Complaint  Patient presents with  . Lap Band Fill    (Consider location/radiation/quality/duration/timing/severity/associated sxs/prior treatment) HPI Comments: Patient has had a lap band in place since 2009 approximately one time a year.  She complains of having food "stuck in the lap band and having had a lap and release drink some fluids and then reinflated.  She recently became established with Dr. Daphine Deutscher locally.  She called the office today.  Was told by Dr. Daphine Deutscher is not in the office.  The bariatric on call surgeon referred the patient to the emergency department, where she was instructed to tell us to notify them of her arrival she did eat  chicken today.  She has no upper teeth, so chewing is difficult  The history is provided by the patient.    Past Medical History  Diagnosis Date  . COPD (chronic obstructive pulmonary disease)   . Diabetes mellitus   . GERD (gastroesophageal reflux disease)   . Arthritis   . Obesity   . Asthma   . Osteoporosis     Past Surgical History  Procedure Date  . Laparoscopic gastric banding   . Hernia repair   . Tubal ligation   . Bladder surgery   . Esophagogastroduodenoscopy 11/17/2011    Procedure: ESOPHAGOGASTRODUODENOSCOPY (EGD);  Surgeon: Rachael Fee, MD;  Location: [facility] ENDOSCOPY;  Service: Endoscopy;  Laterality: N/A;  . Esophagogastroduodenoscopy 01/02/2012    Procedure: ESOPHAGOGASTRODUODENOSCOPY (EGD);  Surgeon: Rachael Fee, MD;  Location: [facility] ENDOSCOPY;  Service: Endoscopy;  Laterality: N/A;    Family History  Problem Relation Age of Onset  . Colon cancer Neg Hx   . Breast cancer Mother   . Diabetes Mother     Sister and Father  . Cancer Mother     breast  . COPD Mother   . Heart disease Father     and Sister  . Cirrhosis Father     Non-alcholic   . Diabetes Father   . Hypertension Father   . Kidney disease Father     chronic  kidney stones  . Diabetes Sister   . Diabetes Brother     History  Substance Use Topics  . Smoking status: Former Smoker    Quit date: 11/16/2005  . Smokeless tobacco: Former Neurosurgeon  . Alcohol Use: Yes     occasion - once or twice a year    OB History    Grav Para Term Preterm Abortions TAB SAB Ect Mult Living                  Review of Systems  Constitutional: Negative for fever and chills.  Gastrointestinal: Positive for nausea, abdominal pain and abdominal distention. Negative for vomiting.  Neurological: Positive for dizziness. Negative for weakness.    Allergies  Sulfa antibiotics; Cephalexin; Ciprofloxacin hcl; and Penicillins  Home Medications   Current Outpatient Rx  Name Route Sig Dispense Refill  . GLYBURIDE-METFORMIN 5-500 MG PO TABS Oral Take 2 tablets by mouth at bedtime.     . IBUPROFEN 200 MG PO TABS Oral Take 400 mg by mouth every 6 (six) hours as needed. pain    . OMEPRAZOLE 20 MG PO CPDR Oral Take 20 mg by mouth daily as needed. Acid reflux....      BP 140/56  Pulse 70  Temp 98 F (36.7 C) (Oral)  Resp 20  Ht 5\' 6"  (1.676 m)  Wt 258 lb (117.028 kg)  BMI 41.64 kg/m2  SpO2 98%  Physical Exam  Constitutional: She appears well-developed and well-nourished.  HENT:  Head: Normocephalic.  Eyes: Pupils are equal, round, and reactive to light.  Neck: Normal range of motion.  Cardiovascular: Normal rate.   Pulmonary/Chest: Effort normal.  Abdominal: She exhibits distension. There is no tenderness. There is no rebound.  Musculoskeletal: Normal range of motion.  Neurological: She is alert.  Skin: Skin is warm and dry.    ED Course  Procedures (including critical care time)  Labs Reviewed - No data to display No results found.   1. Dysphagia       MDM   Dr. Ezzard Standing, stated, he would be at the bedside.  An approximate one-hour he asked that a 21-gauge Huber needle be available for the procedure Patient is now, stating, that she can eat and  drink without difficulty and no pain.  She wishes to go       Arman Filter, NP 03/20/12 1943

## 2012-03-20 NOTE — ED Notes (Signed)
Bed:[facility]PT3<BR> Expected date:<BR> Expected time:<BR> Means of arrival:<BR> Comments:<BR>

## 2012-03-20 NOTE — ED Notes (Signed)
[id number]<AV> Expected date:<BR> Expected time:<BR> Means of arrival:<BR> Comments:<BR> Triage 3

## 2012-03-21 NOTE — ED Provider Notes (Signed)
Medical screening examination/treatment/procedure(s) were performed by non-physician practitioner and as supervising physician I was immediately available for consultation/collaboration.   Clytie Shetley B. Kaliyah Gladman, MD 03/21/12 1344 

## 2012-03-27 ENCOUNTER — Encounter (INDEPENDENT_AMBULATORY_CARE_PROVIDER_SITE_OTHER): Payer: Self-pay | Admitting: Surgery

## 2012-03-27 ENCOUNTER — Ambulatory Visit (INDEPENDENT_AMBULATORY_CARE_PROVIDER_SITE_OTHER): Payer: PRIVATE HEALTH INSURANCE | Admitting: Surgery

## 2012-03-27 VITALS — BP 126/80 | HR 64 | Temp 97.2°F | Resp 14 | Ht 66.0 in | Wt 256.4 lb

## 2012-03-27 DIAGNOSIS — Z9884 Bariatric surgery status: Secondary | ICD-10-CM

## 2012-03-27 DIAGNOSIS — Z4651 Encounter for fitting and adjustment of gastric lap band: Secondary | ICD-10-CM

## 2012-03-27 NOTE — Patient Instructions (Signed)

## 2012-03-27 NOTE — Progress Notes (Signed)
Terri [name] Body mass index is 41.38 kg/(m^2).  Having regurgitation:  no  Nocturnal reflux?  no  Amount of fill  0.6 cc  Had one episode of food impaction that cleared spontaneously while at the [facility] ER.  She hasn't had any GER or spitting up.   Access was more difficult and the part is best accessed in middle of scar

## 2012-04-07 ENCOUNTER — Ambulatory Visit: Payer: Medicaid Other | Admitting: *Deleted

## 2012-04-21 ENCOUNTER — Ambulatory Visit: Payer: Medicaid Other | Admitting: *Deleted

## 2012-05-15 ENCOUNTER — Encounter (INDEPENDENT_AMBULATORY_CARE_PROVIDER_SITE_OTHER): Payer: PRIVATE HEALTH INSURANCE | Admitting: Surgery

## 2012-06-09 ENCOUNTER — Other Ambulatory Visit (HOSPITAL_COMMUNITY): Payer: Self-pay | Admitting: Pulmonary Disease

## 2012-06-09 ENCOUNTER — Ambulatory Visit (HOSPITAL_COMMUNITY)
Admission: RE | Admit: 2012-06-09 | Discharge: 2012-06-09 | Disposition: A | Discharge status: Home / Self Care | Payer: PRIVATE HEALTH INSURANCE | Source: Ambulatory Visit | Attending: Pulmonary Disease | Admitting: Pulmonary Disease

## 2012-06-09 DIAGNOSIS — R0602 Shortness of breath: Secondary | ICD-10-CM | POA: Insufficient documentation

## 2012-06-09 DIAGNOSIS — R05 Cough: Secondary | ICD-10-CM

## 2012-06-09 DIAGNOSIS — R059 Cough, unspecified: Secondary | ICD-10-CM | POA: Insufficient documentation

## 2012-06-18 ENCOUNTER — Encounter (INDEPENDENT_AMBULATORY_CARE_PROVIDER_SITE_OTHER): Payer: PRIVATE HEALTH INSURANCE | Admitting: Surgery

## 2012-06-30 ENCOUNTER — Other Ambulatory Visit (HOSPITAL_COMMUNITY): Payer: Self-pay | Admitting: Pulmonary Disease

## 2012-06-30 DIAGNOSIS — Z139 Encounter for screening, unspecified: Secondary | ICD-10-CM

## 2012-07-07 ENCOUNTER — Ambulatory Visit (HOSPITAL_COMMUNITY)
Admission: RE | Admit: 2012-07-07 | Discharge: 2012-07-07 | Disposition: A | Discharge status: Home / Self Care | Payer: PRIVATE HEALTH INSURANCE | Source: Ambulatory Visit | Attending: Pulmonary Disease | Admitting: Pulmonary Disease

## 2012-07-07 DIAGNOSIS — Z1231 Encounter for screening mammogram for malignant neoplasm of breast: Secondary | ICD-10-CM | POA: Insufficient documentation

## 2012-07-07 DIAGNOSIS — Z139 Encounter for screening, unspecified: Secondary | ICD-10-CM

## 2012-07-08 ENCOUNTER — Telehealth (INDEPENDENT_AMBULATORY_CARE_PROVIDER_SITE_OTHER): Payer: Self-pay

## 2012-07-08 NOTE — Telephone Encounter (Signed)
Spoke with pt letting her know that her appt time for March 21st has been changed to 1040a

## 2012-07-09 ENCOUNTER — Other Ambulatory Visit: Payer: Self-pay | Admitting: Pulmonary Disease

## 2012-07-09 DIAGNOSIS — R928 Other abnormal and inconclusive findings on diagnostic imaging of breast: Secondary | ICD-10-CM

## 2012-07-19 ENCOUNTER — Other Ambulatory Visit: Payer: Self-pay

## 2012-07-23 ENCOUNTER — Other Ambulatory Visit: Payer: Self-pay | Admitting: Pulmonary Disease

## 2012-07-23 ENCOUNTER — Ambulatory Visit (HOSPITAL_COMMUNITY)
Admission: RE | Admit: 2012-07-23 | Discharge: 2012-07-23 | Disposition: A | Discharge status: Home / Self Care | Payer: PRIVATE HEALTH INSURANCE | Source: Ambulatory Visit | Attending: Pulmonary Disease | Admitting: Pulmonary Disease

## 2012-07-23 DIAGNOSIS — R928 Other abnormal and inconclusive findings on diagnostic imaging of breast: Secondary | ICD-10-CM

## 2012-07-23 DIAGNOSIS — R921 Mammographic calcification found on diagnostic imaging of breast: Secondary | ICD-10-CM

## 2012-07-29 ENCOUNTER — Ambulatory Visit
Admission: RE | Admit: 2012-07-29 | Discharge: 2012-07-29 | Disposition: A | Discharge status: Home / Self Care | Payer: PRIVATE HEALTH INSURANCE | Source: Ambulatory Visit | Attending: Pulmonary Disease | Admitting: Pulmonary Disease

## 2012-07-29 DIAGNOSIS — R921 Mammographic calcification found on diagnostic imaging of breast: Secondary | ICD-10-CM

## 2012-08-18 ENCOUNTER — Encounter (HOSPITAL_COMMUNITY): Payer: Self-pay | Admitting: *Deleted

## 2012-08-18 ENCOUNTER — Observation Stay (HOSPITAL_COMMUNITY)
Admission: EM | Admit: 2012-08-18 | Discharge: 2012-08-19 | Disposition: A | Discharge status: Home / Self Care | Payer: PRIVATE HEALTH INSURANCE | Source: Ambulatory Visit | Attending: Pulmonary Disease | Admitting: Pulmonary Disease

## 2012-08-18 ENCOUNTER — Emergency Department (HOSPITAL_COMMUNITY): Payer: PRIVATE HEALTH INSURANCE

## 2012-08-18 DIAGNOSIS — R079 Chest pain, unspecified: Principal | ICD-10-CM | POA: Diagnosis present

## 2012-08-18 DIAGNOSIS — J441 Chronic obstructive pulmonary disease with (acute) exacerbation: Secondary | ICD-10-CM | POA: Diagnosis not present

## 2012-08-18 DIAGNOSIS — Z9884 Bariatric surgery status: Secondary | ICD-10-CM

## 2012-08-18 DIAGNOSIS — E119 Type 2 diabetes mellitus without complications: Secondary | ICD-10-CM | POA: Diagnosis present

## 2012-08-18 HISTORY — DX: Dizziness and giddiness: R42

## 2012-08-18 LAB — CBC WITH DIFFERENTIAL/PLATELET
Basophils Absolute: 0 10*3/uL (ref 0.0–0.1)
Basophils Relative: 0 % (ref 0–1)
MCHC: 33.2 g/dL (ref 30.0–36.0)
Neutro Abs: 8.3 10*3/uL — ABNORMAL HIGH (ref 1.7–7.7)
Neutrophils Relative %: 74 % (ref 43–77)
RDW: 13 % (ref 11.5–15.5)

## 2012-08-18 LAB — COMPREHENSIVE METABOLIC PANEL
AST: 11 U/L (ref 0–37)
Albumin: 3.7 g/dL (ref 3.5–5.2)
Alkaline Phosphatase: 107 U/L (ref 39–117)
Chloride: 98 mEq/L (ref 96–112)
Potassium: 4.4 mEq/L (ref 3.5–5.1)
Sodium: 136 mEq/L (ref 135–145)
Total Bilirubin: 0.4 mg/dL (ref 0.3–1.2)

## 2012-08-18 LAB — TROPONIN I: Troponin I: <0.3 ng/mL (ref <0.30)

## 2012-08-18 LAB — CK TOTAL AND CKMB (NOT AT ARMC): Total CK: 45 U/L (ref 7–177)

## 2012-08-18 LAB — PROTIME-INR: INR: 0.99 (ref 0.00–1.49)

## 2012-08-18 MED ORDER — NITROGLYCERIN 0.4 MG SL SUBL
0.4000 mg | SUBLINGUAL_TABLET | SUBLINGUAL | Status: DC | PRN
  Administered 2012-08-18: 0.4 mg via SUBLINGUAL
  Filled 2012-08-18: qty 25

## 2012-08-18 MED ORDER — SODIUM CHLORIDE 0.9 % IJ SOLN
3.0000 mL | Freq: Two times a day (BID) | INTRAMUSCULAR | Status: DC
  Administered 2012-08-18 – 2012-08-19 (×2): 3 mL via INTRAVENOUS

## 2012-08-18 MED ORDER — PANTOPRAZOLE SODIUM 40 MG PO TBEC
40.0000 mg | DELAYED_RELEASE_TABLET | Freq: Every day | ORAL | Status: DC
  Administered 2012-08-19: 40 mg via ORAL
  Filled 2012-08-18: qty 1

## 2012-08-18 MED ORDER — INSULIN ASPART 100 UNIT/ML ~~LOC~~ SOLN
0.0000 [IU] | Freq: Three times a day (TID) | SUBCUTANEOUS | Status: DC
  Administered 2012-08-19: 4 [IU] via SUBCUTANEOUS
  Administered 2012-08-19: 2 [IU] via SUBCUTANEOUS

## 2012-08-18 MED ORDER — ENOXAPARIN SODIUM 60 MG/0.6ML ~~LOC~~ SOLN
60.0000 mg | SUBCUTANEOUS | Status: DC
  Administered 2012-08-18: 60 mg via SUBCUTANEOUS
  Filled 2012-08-18: qty 0.6

## 2012-08-18 MED ORDER — ASPIRIN 325 MG PO TABS
325.0000 mg | ORAL_TABLET | Freq: Once | ORAL | Status: AC
  Administered 2012-08-18: 325 mg via ORAL
  Filled 2012-08-18: qty 1

## 2012-08-18 MED ORDER — SODIUM CHLORIDE 0.9 % IJ SOLN: 3.0000 mL | Freq: Two times a day (BID) | INTRAMUSCULAR | Status: DC

## 2012-08-18 MED ORDER — ACETAMINOPHEN 325 MG PO TABS
650.0000 mg | ORAL_TABLET | Freq: Four times a day (QID) | ORAL | Status: DC | PRN
  Administered 2012-08-18: 650 mg via ORAL
  Filled 2012-08-18: qty 2

## 2012-08-18 MED ORDER — ONDANSETRON HCL 4 MG/2ML IJ SOLN: 4.0000 mg | Freq: Four times a day (QID) | INTRAMUSCULAR | Status: DC | PRN

## 2012-08-18 MED ORDER — ASPIRIN EC 325 MG PO TBEC
325.0000 mg | DELAYED_RELEASE_TABLET | Freq: Every day | ORAL | Status: DC
  Administered 2012-08-18 – 2012-08-19 (×2): 325 mg via ORAL
  Filled 2012-08-18 (×2): qty 1

## 2012-08-18 MED ORDER — SODIUM CHLORIDE 0.9 % IV SOLN: 250.0000 mL | INTRAVENOUS | Status: DC | PRN

## 2012-08-18 MED ORDER — HYDROCODONE-ACETAMINOPHEN 5-325 MG PO TABS: 1.0000 | ORAL_TABLET | ORAL | Status: DC | PRN

## 2012-08-18 MED ORDER — ACETAMINOPHEN 650 MG RE SUPP: 650.0000 mg | Freq: Four times a day (QID) | RECTAL | Status: DC | PRN

## 2012-08-18 MED ORDER — SODIUM CHLORIDE 0.9 % IJ SOLN: 3.0000 mL | INTRAMUSCULAR | Status: DC | PRN

## 2012-08-18 MED ORDER — ONDANSETRON HCL 4 MG PO TABS: 4.0000 mg | ORAL_TABLET | Freq: Four times a day (QID) | ORAL | Status: DC | PRN

## 2012-08-18 MED ORDER — MORPHINE SULFATE 2 MG/ML IJ SOLN: 2.0000 mg | INTRAMUSCULAR | Status: DC | PRN

## 2012-08-18 MED ORDER — TRAZODONE HCL 50 MG PO TABS
50.0000 mg | ORAL_TABLET | Freq: Every evening | ORAL | Status: DC | PRN
  Administered 2012-08-18: 50 mg via ORAL
  Filled 2012-08-18: qty 1

## 2012-08-18 NOTE — ED Notes (Signed)
Pain lt side of chest, lt neck and lt upper arm since yesterday, cough.non productive.

## 2012-08-18 NOTE — ED Notes (Signed)
Patient ambulatory to room with steady gait.

## 2012-08-18 NOTE — ED Notes (Signed)
Patient does not need anything at this time. 

## 2012-08-18 NOTE — ED Provider Notes (Addendum)
History     This chart was scribed for Donnetta Hutching, MD, MD by Smitty Pluck, ED Scribe. The patient was seen in room APA01/APA01 and the patient's care was started at 1:46PM.   CSN: [id number]  Arrival date & time 08/18/12  1243     Chief Complaint  Patient presents with  . Chest Pain     The history is provided by the patient. No language interpreter was used.   Terri [name] is a 47 y.o. female with hx of COPD, DM, osteoarthritis and asthma who presents to the Emergency Department complaining of intermittent, pressure-like, moderate left chest pain radiating to left upper arm and left lateral neck onset 1 day ago. She states she is having the pain currently but it is not at its worst. Pain is aggravated by lying on her left side. Pt has PCP appointment on 08/28/12. She denies hx of HTN, MI and any other heart related complications. Pt reports that she took 81 mg asa 1 day ago. Pt denies numbness in upper extremities, diaphoresis, fever, chills, nausea, vomiting, diarrhea, weakness, cough, SOB and any other pain. She states that she quit smoking 7 years ago.     PCP is Dr. Juanetta Gosling  Pt does not have cardiologist.  Pt's sister was 73 when she had MI and dad had MI in late 9s.   Past Medical History  Diagnosis Date  . COPD (chronic obstructive pulmonary disease)   . Diabetes mellitus   . GERD (gastroesophageal reflux disease)   . Arthritis   . Obesity   . Asthma   . Osteoporosis   . Vertigo     Past Surgical History  Procedure Laterality Date  . Laparoscopic gastric banding    . Hernia repair    . Tubal ligation    . Bladder surgery    . Esophagogastroduodenoscopy  11/17/2011    Procedure: ESOPHAGOGASTRODUODENOSCOPY (EGD);  Surgeon: Rachael Fee, MD;  Location: [facility] ENDOSCOPY;  Service: Endoscopy;  Laterality: N/A;  . Esophagogastroduodenoscopy  01/02/2012    Procedure: ESOPHAGOGASTRODUODENOSCOPY (EGD);  Surgeon: Rachael Fee, MD;  Location: [facility] ENDOSCOPY;  Service:  Endoscopy;  Laterality: N/A;    Family History  Problem Relation Age of Onset  . Colon cancer Neg Hx   . Breast cancer Mother   . Diabetes Mother     Sister and Father  . Cancer Mother     breast  . COPD Mother   . Heart disease Father     and Sister  . Cirrhosis Father     Non-alcholic   . Diabetes Father   . Hypertension Father   . Kidney disease Father     chronic kidney stones  . Diabetes Sister   . Diabetes Brother     History  Substance Use Topics  . Smoking status: Former Smoker    Quit date: 11/16/2005  . Smokeless tobacco: Former Neurosurgeon  . Alcohol Use: Yes     Comment: occasion - once or twice a year    OB History   Grav Para Term Preterm Abortions TAB SAB Ect Mult Living                  Review of Systems  Constitutional: Negative for diaphoresis.  Respiratory: Negative for shortness of breath.   Cardiovascular: Positive for chest pain.  Gastrointestinal: Negative for nausea and vomiting.  All other systems reviewed and are negative.    Allergies  Sulfa antibiotics; Cephalexin; Ciprofloxacin hcl; and Penicillins  Home  Medications   Current Outpatient Rx  Name  Route  Sig  Dispense  Refill  . glyBURIDE-metformin (GLUCOVANCE) 5-500 MG per tablet   Oral   Take 2 tablets by mouth at bedtime.          Marland Kitchen ibuprofen (ADVIL,MOTRIN) 200 MG tablet   Oral   Take 400 mg by mouth every 6 (six) hours as needed. pain         . omeprazole (PRILOSEC) 20 MG capsule   Oral   Take 20 mg by mouth daily as needed. Acid reflux....           BP 125/55  Pulse 74  Temp(Src) 98 F (36.7 C) (Oral)  Resp 24  Ht 5\' 6"  (1.676 m)  Wt 256 lb (116.121 kg)  BMI 41.34 kg/m2  SpO2 97%  LMP 07/30/2010  Physical Exam  Nursing note and vitals reviewed. Constitutional: She is oriented to person, place, and time. She appears well-developed and well-nourished.  HENT:  Head: Normocephalic and atraumatic.  Eyes: Conjunctivae and EOM are normal. Pupils are equal,  round, and reactive to light.  Neck: Normal range of motion. Neck supple.  Cardiovascular: Normal rate, regular rhythm and normal heart sounds.   Pulmonary/Chest: Effort normal and breath sounds normal.  Abdominal: Soft. Bowel sounds are normal.  Musculoskeletal: Normal range of motion.  Neurological: She is alert and oriented to person, place, and time.  Skin: Skin is warm and dry.  Psychiatric: She has a normal mood and affect.    ED Course  Procedures (including critical care time) DIAGNOSTIC STUDIES: Oxygen Saturation is 97% on room air, normal by my interpretation.    COORDINATION OF CARE: 1:51 PM Discussed ED treatment with pt and pt agrees with asa and nitroglycerin.  2:39 PM Ordered:  Medications  nitroGLYCERIN (NITROSTAT) SL tablet 0.4 mg (0.4 mg Sublingual Given 08/18/12 1416)  aspirin tablet 325 mg (325 mg Oral Given 08/18/12 1416)       Labs Reviewed  CBC WITH DIFFERENTIAL - Abnormal; Notable for the following:    WBC 11.2 (*)    Neutro Abs 8.3 (*)    Monocytes Relative 2 (*)    All other components within normal limits  COMPREHENSIVE METABOLIC PANEL - Abnormal; Notable for the following:    Glucose, Bld 227 (*)    All other components within normal limits  PROTIME-INR  TROPONIN I  CK TOTAL AND CKMB   Dg Chest 2 View  08/18/2012  *RADIOLOGY REPORT*  Clinical Data: Chest pain  CHEST - 2 VIEW  Comparison: 06/09/2012  Findings: Stable mild bronchitic changes and diffuse interstitial prominence.  Normal heart size.  No definite superimposed edema, pneumonia, collapse, consolidation, effusion or pneumothorax. Trachea is midline.  Mild thoracic spondylosis.  IMPRESSION: Stable mild bronchitic changes and interstitial prominence.  No superimposed acute process   Original Report Authenticated By: Judie Petit. Miles Costain, M.D.     Date: 08/18/2012  Rate: 76  Rhythm: normal sinus rhythm  QRS Axis: normal  Intervals: normal  ST/T Wave abnormalities: normal  Conduction Disutrbances:  none  Narrative Interpretation: unremarkable  c PAC's   No diagnosis found.    MDM  History and risk factor profile suggests possibility of cardiac related symptoms.  Discussed with Dr. Juanetta Gosling. Admit to observation      I personally performed the services described in this documentation, which was scribed in my presence. The recorded information has been reviewed and is accurate.    Donnetta Hutching, MD 08/18/12 1511  Arlys John  Adriana Simas, MD 08/18/12 [phone]

## 2012-08-18 NOTE — ED Notes (Signed)
Patient reports pain 3/10 on NPS prior to sublingual nitro. Pain 1/10 after sublingual nitro. BP 100 systolic after nitro. Held subsequent doses due to BP

## 2012-08-19 ENCOUNTER — Other Ambulatory Visit: Payer: Self-pay

## 2012-08-19 DIAGNOSIS — I369 Nonrheumatic tricuspid valve disorder, unspecified: Secondary | ICD-10-CM

## 2012-08-19 DIAGNOSIS — R079 Chest pain, unspecified: Secondary | ICD-10-CM | POA: Diagnosis present

## 2012-08-19 DIAGNOSIS — E119 Type 2 diabetes mellitus without complications: Secondary | ICD-10-CM | POA: Diagnosis present

## 2012-08-19 DIAGNOSIS — J441 Chronic obstructive pulmonary disease with (acute) exacerbation: Secondary | ICD-10-CM | POA: Diagnosis not present

## 2012-08-19 LAB — TROPONIN I: Troponin I: <0.3 ng/mL (ref <0.30)

## 2012-08-19 LAB — GLUCOSE, CAPILLARY: Glucose-Capillary: 182 mg/dL — ABNORMAL HIGH (ref 70–99)

## 2012-08-19 NOTE — Progress Notes (Signed)
UR Chart Review Completed  

## 2012-08-19 NOTE — H&P (Signed)
[name], [name]               ACCOUNT NO.:  [id number]  MEDICAL RECORD NO.:  [id number]  LOCATION:                                 FACILITY:  PHYSICIAN:  Zaylyn Bergdoll L. Juanetta Gosling, M.D.DATE OF BIRTH:  1966-03-02  DATE OF ADMISSION:  08/18/2012 DATE OF DISCHARGE:  LH                             HISTORY & PHYSICAL   HISTORY:  This is a 47 year old, Caucasian female, who has had about a 36-hour history of chest discomfort.  This has been in her midchest, has gone to her left shoulder into her neck.  It is actually a little bit better today.  She has no other new complaints.  She has multiple cardiac risk factors including smoking history, diabetes positive family history, and obesity and fairly sedentary lifestyle.  PAST MEDICAL HISTORY:  Positive for COPD, diabetes, GERD, arthritis, obesity, asthma, osteoporosis, and vertigo.  Surgically, she has had gastric band surgery, hernia repair, tubal ligation, bladder surgery, EGD.  FAMILY HISTORY:  Positive for breast cancer in her mother, diabetes in her mother, COPD in her mother, heart disease in her father and sister, her father had cirrhosis, there is diabetes and hypertension in the family as well.  SOCIAL HISTORY:  She stopped smoking about 7 years ago and has about a 30 pack-year smoking history.  She lives at home with her husband.  She drinks alcohol once or twice a year.  REVIEW OF SYSTEMS:  Except as mentioned is negative.  She has not had any cough, congestion, fever, chills, hemoptysis, night sweats.  MEDICATIONS:  Include Glucovance 5/500 two at bedtime, ibuprofen as needed for pain, and omeprazole 20 mg daily.  PHYSICAL EXAMINATION:  GENERAL:  Shows she is awake and alert.  She is obese. VITAL SIGNS:  Blood pressure 125/55, pulse 74, temperature is 98, respirations 24, weight is 256 pounds, height 5 feet 6 inches.  BMI 41.34. HEENT:  Her pupils are reactive.  Nose and throat are clear.  Mucous membranes are  moist. NECK:  Supple without masses, bruits, or JVD. CHEST:  Relatively clear. HEART:  Regular without gallop. ABDOMEN:  Soft. EXTREMITIES:  No edema. CENTRAL NERVOUS SYSTEM:  Grossly intact.  She does not have any chest wall tenderness.  EKG does not show anything specific.  She says she took nitroglycerin and thinks that helped somewhat.  Her troponin level is normal.  Assessment then she has chest pain and has multiple cardiac risk factors.  Plan is for her to have serial troponins, serial EKGs.  She will have a D-dimer and have Cardiology consultation.     Avanelle Pixley L. Juanetta Gosling, M.D.     ELH/MEDQ  D:  08/18/2012  T:  08/19/2012  Job:  [id number]

## 2012-08-19 NOTE — Care Management Note (Signed)
    Page 1 of 1   08/19/2012     2:25:15 PM   CARE MANAGEMENT NOTE 08/19/2012  Patient:  Terri [name], Terri [name]   Account Number:  [id number]  Date Initiated:  08/19/2012  Documentation initiated by:  Sharrie Rothman  Subjective/Objective Assessment:   Pt admitted from home with CP. Pt lives with her husband and will return home at discharge. Pt is independent.     Action/Plan:   Pt discharged home today. No CM needs noted.   Anticipated DC Date:  08/19/2012   Anticipated DC Plan:  HOME/SELF CARE      DC Planning Services  CM consult      Choice offered to / List presented to:             Status of service:  Completed, signed off Medicare Important Message given?   (If response is "NO", the following Medicare IM given date fields will be blank) Date Medicare IM given:   Date Additional Medicare IM given:    Discharge Disposition:  HOME/SELF CARE  Per UR Regulation:    If discussed at Long Length of Stay Meetings, dates discussed:    Comments:  08/19/12 1425 Arlyss Queen, RN BSN CM

## 2012-08-19 NOTE — Consult Note (Signed)
CARDIOLOGY CONSULT NOTE    Patient ID: Terri [name] MRN: [id number] DOB/AGE: [birth year] 47 y.o.  Admit date: 08/18/2012 Referring Physician:  Juanetta Gosling Primary Physician: Fredirick Maudlin, MD Primary Cardiologist:  New Reason for Consultation:  Chest Pain  Principal Problem:   Chest pain Active Problems:   Lapband in [facility] 2009   COPD exacerbation   Diabetes   HPI:   Terri [name] is a 47 y.o. female with hx of COPD, DM, osteoarthritis and asthma who presents to the Emergency Department complaining of intermittent, pressure-like, moderate left chest pain radiating to left upper arm and left lateral neck onset 1 day ago. She states she is having the pain currently but it is not at its worst. Pain is aggravated by lying on her left side. Pt has PCP appointment on 08/28/12. She denies hx of HTN, MI and any other heart related complications. Pt reports that she took 81 mg asa 1 day ago. Pt denies numbness in upper extremities, diaphoresis, fever, chills, nausea, vomiting, diarrhea, weakness, cough, SOB and any other pain. She states that she quit smoking 7 years ago.  Pain also radiates to neck and worse with neck motion.  No previous issues with heart.  Currently pain free   @ROS @ All other systems reviewed and negative except as noted above  Past Medical History  Diagnosis Date  . COPD (chronic obstructive pulmonary disease)   . Diabetes mellitus   . GERD (gastroesophageal reflux disease)   . Arthritis   . Obesity   . Asthma   . Osteoporosis   . Vertigo     Family History  Problem Relation Age of Onset  . Colon cancer Neg Hx   . Breast cancer Mother   . Diabetes Mother     Sister and Father  . Cancer Mother     breast  . COPD Mother   . Heart disease Father     and Sister  . Cirrhosis Father     Non-alcholic   . Diabetes Father   . Hypertension Father   . Kidney disease Father     chronic kidney stones  . Diabetes Sister   . Diabetes Brother     History    Social History  . Marital Status: Married    Spouse Name: N/A    Number of Children: 4  . Years of Education: N/A   Occupational History  . Disabled     Social History Main Topics  . Smoking status: Former Smoker    Quit date: 11/16/2005  . Smokeless tobacco: Former Neurosurgeon  . Alcohol Use: Yes     Comment: occasion - once or twice a year  . Drug Use: No  . Sexually Active: Yes    Birth Control/ Protection: Surgical   Other Topics Concern  . Not on file   Social History Narrative   2 caffeine drinks daily     Past Surgical History  Procedure Laterality Date  . Laparoscopic gastric banding    . Hernia repair    . Tubal ligation    . Bladder surgery    . Esophagogastroduodenoscopy  11/17/2011    Procedure: ESOPHAGOGASTRODUODENOSCOPY (EGD);  Surgeon: Rachael Fee, MD;  Location: [facility] ENDOSCOPY;  Service: Endoscopy;  Laterality: N/A;  . Esophagogastroduodenoscopy  01/02/2012    Procedure: ESOPHAGOGASTRODUODENOSCOPY (EGD);  Surgeon: Rachael Fee, MD;  Location: [facility] ENDOSCOPY;  Service: Endoscopy;  Laterality: N/A;     . aspirin EC  325 mg Oral Daily  . enoxaparin (  LOVENOX) injection  60 mg Subcutaneous Q24H  . insulin aspart  0-20 Units Subcutaneous TID WC  . pantoprazole  40 mg Oral Q1200  . sodium chloride  3 mL Intravenous Q12H  . sodium chloride  3 mL Intravenous Q12H      Physical Exam: BP 113/70  Pulse 63  Temp(Src) 98.1 F (36.7 C) (Oral)  Resp 20  Ht 5\' 6"  (1.676 m)  Wt 251 lb 11.2 oz (114.17 kg)  BMI 40.64 kg/m2  SpO2 96%  LMP 07/30/2010   Affect appropriate Obese white female HEENT: normal Neck supple with no adenopathy JVP normal no bruits no thyromegaly Lungs clear with no wheezing and good diaphragmatic motion Heart:  S1/S2 no murmur, no rub, gallop or click PMI normal Abdomen: benighn, BS positve, no tenderness, no AAA no bruit.  No HSM or HJR Distal pulses intact with no bruits No edema Neuro non-focal Skin warm and dry No muscular  weakness   Labs:   Lab Results  Component Value Date   WBC 11.2* 08/18/2012   HGB 14.2 08/18/2012   HCT 42.8 08/18/2012   MCV 88.2 08/18/2012   PLT 243 08/18/2012    Recent Labs Lab 08/18/12 1331  NA 136  K 4.4  CL 98  CO2 27  BUN 10  CREATININE 0.77  CALCIUM 9.4  PROT 7.8  BILITOT 0.4  ALKPHOS 107  ALT 12  AST 11  GLUCOSE 227*   Lab Results  Component Value Date   CKTOTAL 45 08/18/2012   CKMB 1.3 08/18/2012   TROPONINI <0.30 08/19/2012       Radiology: Dg Chest 2 View  08/18/2012  *RADIOLOGY REPORT*  Clinical Data: Chest pain  CHEST - 2 VIEW  Comparison: 06/09/2012  Findings: Stable mild bronchitic changes and diffuse interstitial prominence.  Normal heart size.  No definite superimposed edema, pneumonia, collapse, consolidation, effusion or pneumothorax. Trachea is midline.  Mild thoracic spondylosis.  IMPRESSION: Stable mild bronchitic changes and interstitial prominence.  No superimposed acute process   Original Report Authenticated By: Judie Petit. Miles Costain, M.D.    Mm Digital Diagnostic Unilat R  07/29/2012  **ADDENDUM** CREATED: 07/29/2012 09:07:33  The patient was placed on the stereotactic table and scout images were obtained.  The calcifications noted on the recent screening and digital mammograms appeared to be associated with an artery within the lateral portion of the breast.  As a result, the patient was taken to the diagnostic room and additional magnification images were obtained which demonstrated the calcifications to represent arterial calcification located within the upper-outer quadrant of the right breast.  There are no worrisome calcifications to suggest DCIS.  Stereotactic core biopsy has been cancelled.  I recommend screening mammography in 1 year.  Impression:  Benign arterial calcification.  BI-RADS CATEGORY 2:  Benign finding(s).  Addended by:  Rolla Plate, M.D. on 07/29/2012 09:07:33.  **END ADDENDUM** SIGNED BY: Rolla Plate, M.D.   07/23/2012  *RADIOLOGY  REPORT*  Clinical Data:  Abnormal calcifications seen on recent screening mammography in the right breast  DIGITAL DIAGNOSTIC RIGHT MAMMOGRAM  Comparison: With priors  Findings:  ACR Breast Density Category 2: There is a scattered fibroglandular pattern.  Magnification views of the upper outer quadrant of the right breast show 3 mm clustered calcifications.  They vary in size and shape and have developed since the prior exam.  IMPRESSION: Suspicious right breast calcifications.  Tissue sampling is recommended.  RECOMMENDATION: Right stereotactic breast biopsy is recommended will be scheduled at the patient's  convenience.  I have discussed the findings and recommendations with the patient. Results were also provided in writing at the conclusion of the visit.  BI-RADS CATEGORY 4:  Suspicious abnormality - biopsy should be considered.   Original Report Authenticated By: Baird Lyons, M.D.     EKG:  NSR no acute ischemic changes rate 76 low voltage from body habitus   ASSESSMENT AND PLAN:  Chest Pain:  Atypical features with negative enzymes and normal ECG.  Discharge home today if echo normal and arrange outpatient  Stress myovue. DM: Discussed low carb diet.  Target hemoglobin A1c is 6.5 or less.  Continue current medications.   SignedCharlton Haws 08/19/2012, 11:08 AM

## 2012-08-19 NOTE — Progress Notes (Signed)
*  PRELIMINARY RESULTS* Echocardiogram 2D Echocardiogram has been performed.  Conrad Mexia 08/19/2012, 11:14 AM

## 2012-08-19 NOTE — Progress Notes (Signed)
Subjective: She feels better. She says she has not had any problems with chest pain since she received nitroglycerin in the emergency room. Her troponins are negative so far. EKG does not show anything acute  Objective: Vital signs in last 24 hours: Temp:  [98 F (36.7 C)-98.3 F (36.8 C)] 98.1 F (36.7 C) (03/18 0409) Pulse Rate:  [62-74] 63 (03/18 0409) Resp:  [17-24] 20 (03/18 0409) BP: (100-125)/(55-70) 113/70 mmHg (03/18 0409) SpO2:  [94 %-97 %] 96 % (03/18 0409) Weight:  [114.17 kg (251 lb 11.2 oz)-116.121 kg (256 lb)] 114.17 kg (251 lb 11.2 oz) (03/18 0409) Weight change:  Last BM Date: 08/17/12 (will have next shift verify)  Intake/Output from previous day: 03/17 0701 - 03/18 0700 In: 480 [P.O.:480] Out: 300 [Urine:300]  PHYSICAL EXAM General appearance: alert, cooperative, no distress and moderately obese Resp: clear to auscultation bilaterally Cardio: regular rate and rhythm, S1, S2 normal, no murmur, click, rub or gallop GI: soft, non-tender; bowel sounds normal; no masses,  no organomegaly Extremities: extremities normal, atraumatic, no cyanosis or edema  Lab Results:    Basic Metabolic Panel:  Recent Labs  47/82/95 1331  NA 136  K 4.4  CL 98  CO2 27  GLUCOSE 227*  BUN 10  CREATININE 0.77  CALCIUM 9.4   Liver Function Tests:  Recent Labs  08/18/12 1331  AST 11  ALT 12  ALKPHOS 107  BILITOT 0.4  PROT 7.8  ALBUMIN 3.7   No results found for this basename: LIPASE, AMYLASE,  in the last 72 hours No results found for this basename: AMMONIA,  in the last 72 hours CBC:  Recent Labs  08/18/12 1331  WBC 11.2*  NEUTROABS 8.3*  HGB 14.2  HCT 42.8  MCV 88.2  PLT 243   Cardiac Enzymes:  Recent Labs  08/18/12 1331 08/18/12 1924 08/19/12 0121 08/19/12 0544  CKTOTAL 45  --   --   --   CKMB 1.3  --   --   --   TROPONINI <0.30 <0.30 <0.30 <0.30   BNP: No results found for this basename: PROBNP,  in the last 72 hours D-Dimer:  Recent  Labs  08/18/12 1731  DDIMER <0.27   CBG:  Recent Labs  08/18/12 2052 08/19/12 0744  GLUCAP 175* 150*   Hemoglobin A1C: No results found for this basename: HGBA1C,  in the last 72 hours Fasting Lipid Panel: No results found for this basename: CHOL, HDL, LDLCALC, TRIG, CHOLHDL, LDLDIRECT,  in the last 72 hours Thyroid Function Tests: No results found for this basename: TSH, T4TOTAL, FREET4, T3FREE, THYROIDAB,  in the last 72 hours Anemia Panel: No results found for this basename: VITAMINB12, FOLATE, FERRITIN, TIBC, IRON, RETICCTPCT,  in the last 72 hours Coagulation:  Recent Labs  08/18/12 1331  LABPROT 13.0  INR 0.99   Urine Drug Screen: Drugs of Abuse  No results found for this basename: labopia, cocainscrnur, labbenz, amphetmu, thcu, labbarb    Alcohol Level: No results found for this basename: ETH,  in the last 72 hours Urinalysis: No results found for this basename: COLORURINE, APPERANCEUR, LABSPEC, PHURINE, GLUCOSEU, HGBUR, BILIRUBINUR, KETONESUR, PROTEINUR, UROBILINOGEN, NITRITE, LEUKOCYTESUR,  in the last 72 hours Misc. Labs:  ABGS No results found for this basename: PHART, PCO2, PO2ART, TCO2, HCO3,  in the last 72 hours CULTURES No results found for this or any previous visit (from the past 240 hour(s)). Studies/Results: Dg Chest 2 View  08/18/2012  *RADIOLOGY REPORT*  Clinical Data: Chest pain  CHEST - 2 VIEW  Comparison: 06/09/2012  Findings: Stable mild bronchitic changes and diffuse interstitial prominence.  Normal heart size.  No definite superimposed edema, pneumonia, collapse, consolidation, effusion or pneumothorax. Trachea is midline.  Mild thoracic spondylosis.  IMPRESSION: Stable mild bronchitic changes and interstitial prominence.  No superimposed acute process   Original Report Authenticated By: Judie Petit. Miles Costain, M.D.     Medications:  Prior to Admission:  Prescriptions prior to admission  Medication Sig Dispense Refill  . aspirin EC 81 MG tablet Take  162 mg by mouth daily as needed for pain.      Marland Kitchen glyBURIDE-metformin (GLUCOVANCE) 5-500 MG per tablet Take 2 tablets by mouth at bedtime.       Marland Kitchen omeprazole (PRILOSEC) 20 MG capsule Take 20 mg by mouth daily as needed (Acid Reflux). Acid reflux....       Scheduled: . aspirin EC  325 mg Oral Daily  . enoxaparin (LOVENOX) injection  60 mg Subcutaneous Q24H  . insulin aspart  0-20 Units Subcutaneous TID WC  . pantoprazole  40 mg Oral Q1200  . sodium chloride  3 mL Intravenous Q12H  . sodium chloride  3 mL Intravenous Q12H   Continuous:  ZOX:WRUEAV chloride, acetaminophen, acetaminophen, HYDROcodone-acetaminophen, morphine injection, nitroGLYCERIN, ondansetron (ZOFRAN) IV, ondansetron, sodium chloride, traZODone  Assesment: She has chest pain and has had multiple cardiac risk factors including smoking history family history diabetes obesity sedentary lifestyle. Active Problems:   * No active hospital problems. *    Plan: She has ruled out for myocardial infarction so far. She will have echocardiogram and cardiology consultation    LOS: 1 day   Krysti Hickling L 08/19/2012, 8:41 AM

## 2012-08-22 ENCOUNTER — Encounter (INDEPENDENT_AMBULATORY_CARE_PROVIDER_SITE_OTHER): Payer: PRIVATE HEALTH INSURANCE | Admitting: Surgery

## 2012-08-22 NOTE — Discharge Summary (Signed)
Physician Discharge Summary  Patient ID: Terri [name] MRN: [id number] DOB/AGE: September 09, 1965 47 y.o. Primary Care Physician:Terri Gilreath L, MD Admit date: 08/18/2012 Discharge date: 08/22/2012    Discharge Diagnoses:   Principal Problem:   Chest pain Active Problems:   Lapband in [facility] 2009   COPD exacerbation   Diabetes     Medication List    TAKE these medications       aspirin EC 81 MG tablet  Take 162 mg by mouth daily as needed for pain.     glyBURIDE-metformin 5-500 MG per tablet  Commonly known as:  GLUCOVANCE  Take 2 tablets by mouth at bedtime.     omeprazole 20 MG capsule  Commonly known as:  PRILOSEC  Take 20 mg by mouth daily as needed (Acid Reflux). Acid reflux....        Discharged Condition: Improved    Consults: Cardiology  Significant Diagnostic Studies: Dg Chest 2 View  08/18/2012  *RADIOLOGY REPORT*  Clinical Data: Chest pain  CHEST - 2 VIEW  Comparison: 06/09/2012  Findings: Stable mild bronchitic changes and diffuse interstitial prominence.  Normal heart size.  No definite superimposed edema, pneumonia, collapse, consolidation, effusion or pneumothorax. Trachea is midline.  Mild thoracic spondylosis.  IMPRESSION: Stable mild bronchitic changes and interstitial prominence.  No superimposed acute process   Original Report Authenticated By: Terri [name], M.D.    Mm Digital Diagnostic Unilat R  07/29/2012  **ADDENDUM** CREATED: 07/29/2012 [name]  The patient was placed on the stereotactic table and scout images were obtained.  The calcifications noted on the recent screening and digital mammograms appeared to be associated with an artery within the lateral portion of the breast.  As a result, the patient was taken to the diagnostic room and additional magnification images were obtained which demonstrated the calcifications to represent arterial calcification located within the upper-outer quadrant of the right breast.  There are no worrisome  calcifications to suggest DCIS.  Stereotactic core biopsy has been cancelled.  I recommend screening mammography in 1 year.  Impression:  Benign arterial calcification.  BI-RADS CATEGORY 2:  Benign finding(s).  Addended by:  Terri [name], Terri [name].  **END ADDENDUM** SIGNED BY: Terri [name], M.D.   07/23/2012  *RADIOLOGY REPORT*  Clinical Data:  Abnormal calcifications seen on recent screening mammography in the right breast  DIGITAL DIAGNOSTIC RIGHT MAMMOGRAM  Comparison: With priors  Findings:  ACR Breast Density Category 2: There is a scattered fibroglandular pattern.  Magnification views of the upper outer quadrant of the right breast show 3 mm clustered calcifications.  They vary in size and shape and have developed since the prior exam.  IMPRESSION: Suspicious right breast calcifications.  Tissue sampling is recommended.  RECOMMENDATION: Right stereotactic breast biopsy is recommended will be scheduled at the patient's convenience.  I have discussed the findings and recommendations with the patient. Results were also provided in writing at the conclusion of the visit.  BI-RADS CATEGORY 4:  Suspicious abnormality - biopsy should be considered.   Original Report Authenticated By: Terri [name], M.D.     Lab Results: Basic Metabolic Panel: No results found for this basename: NA, K, CL, CO2, GLUCOSE, BUN, CREATININE, CALCIUM, MG, PHOS,  in the last 72 hours Liver Function Tests: No results found for this basename: AST, ALT, ALKPHOS, BILITOT, PROT, ALBUMIN,  in the last 72 hours   CBC: No results found for this basename: WBC, NEUTROABS, HGB, HCT, MCV, PLT,  in the last 72 hours  No results found for this or any previous visit (from the past 240 hour(s)).   Hospital Course: She came to the emergency because of about 36 hours worth of chest discomfort. This was in her substernal area moved into her shoulder and neck. It was somewhat atypical because it was sharp and minimally  pleuritic. When she came to the emergency room she was given nitroglycerin and her pain improved and has not returned. She ruled out for myocardial infarction with normal troponins and her EKG did not show any acute changes. She does have multiple cardiac risk factors however and had cardiology consultation. Echocardiogram was ordered and was essentially normal and she is set for a stress test next week.  Discharge Exam: Blood pressure 108/69, pulse 68, temperature 97.9 F (36.6 C), temperature source Oral, resp. rate 20, height 5\' 6"  (1.676 m), weight 114.17 kg (251 lb 11.2 oz), last menstrual period 07/30/2010, SpO2 95.00%. She is awake and alert. Her chest is clear. Her heart is regular.  Disposition: Home for followup with cardiology and a stress test      Discharge Orders   Future Appointments Provider Department Dept Phone   08/29/2012 11:45 AM [facility] Stress Lab [facility] CARDIAC REHABILITATION [phone]   09/12/2012 10:15 AM Terri Merino, MD [facility] Surgery, [place] [phone]   Future Orders Complete By Expires     Discharge patient  As directed          Signed: Saniya Tranchina [name] Pager [phone]  08/22/2012, 7:45 AM

## 2012-08-26 ENCOUNTER — Other Ambulatory Visit: Payer: Self-pay | Admitting: *Deleted

## 2012-08-26 DIAGNOSIS — R079 Chest pain, unspecified: Secondary | ICD-10-CM

## 2012-08-29 ENCOUNTER — Encounter (HOSPITAL_COMMUNITY): Payer: PRIVATE HEALTH INSURANCE

## 2012-08-29 ENCOUNTER — Encounter (HOSPITAL_COMMUNITY): Admission: RE | Admit: 2012-08-29 | Payer: PRIVATE HEALTH INSURANCE | Source: Ambulatory Visit

## 2012-08-29 ENCOUNTER — Ambulatory Visit (HOSPITAL_COMMUNITY): Admit: 2012-08-29 | Payer: PRIVATE HEALTH INSURANCE

## 2012-09-03 ENCOUNTER — Encounter (HOSPITAL_COMMUNITY)
Admission: RE | Admit: 2012-09-03 | Discharge: 2012-09-03 | Disposition: A | Discharge status: Home / Self Care | Payer: PRIVATE HEALTH INSURANCE | Source: Ambulatory Visit | Attending: Cardiovascular Disease | Admitting: Cardiovascular Disease

## 2012-09-03 ENCOUNTER — Ambulatory Visit (HOSPITAL_COMMUNITY)
Admission: RE | Admit: 2012-09-03 | Discharge: 2012-09-03 | Disposition: A | Discharge status: Home / Self Care | Payer: PRIVATE HEALTH INSURANCE | Source: Ambulatory Visit | Attending: Cardiovascular Disease | Admitting: Cardiovascular Disease

## 2012-09-03 ENCOUNTER — Encounter (HOSPITAL_COMMUNITY): Payer: Self-pay | Admitting: Cardiovascular Disease

## 2012-09-03 ENCOUNTER — Encounter (HOSPITAL_COMMUNITY): Payer: Self-pay

## 2012-09-03 DIAGNOSIS — R5381 Other malaise: Secondary | ICD-10-CM | POA: Insufficient documentation

## 2012-09-03 DIAGNOSIS — R079 Chest pain, unspecified: Secondary | ICD-10-CM | POA: Insufficient documentation

## 2012-09-03 DIAGNOSIS — R5383 Other fatigue: Secondary | ICD-10-CM | POA: Insufficient documentation

## 2012-09-03 DIAGNOSIS — R0602 Shortness of breath: Secondary | ICD-10-CM | POA: Insufficient documentation

## 2012-09-03 MED ORDER — REGADENOSON 0.4 MG/5ML IV SOLN
INTRAVENOUS | Status: AC
  Filled 2012-09-03: qty 5

## 2012-09-03 MED ORDER — TECHNETIUM TC 99M SESTAMIBI - CARDIOLITE
10.0000 | Freq: Once | INTRAVENOUS | Status: AC | PRN
  Administered 2012-09-03: 9.2 via INTRAVENOUS

## 2012-09-03 MED ORDER — TECHNETIUM TC 99M SESTAMIBI - CARDIOLITE
30.0000 | Freq: Once | INTRAVENOUS | Status: AC | PRN
  Administered 2012-09-03: 12:00:00 30 via INTRAVENOUS

## 2012-09-03 MED ORDER — SODIUM CHLORIDE 0.9 % IJ SOLN
INTRAMUSCULAR | Status: AC
  Administered 2012-09-03: 10 mL via INTRAVENOUS
  Filled 2012-09-03: qty 10

## 2012-09-03 NOTE — Progress Notes (Signed)
Stress Lab Nurses Notes - [name]  Terri [name] 09/03/2012 Reason for doing test: Chest Pain and post hospital Type of test: Stress Cardiolite Nurse performing test: Parke Poisson, RN Nuclear Medicine Tech: Lyndel Pleasure Echo Tech: Not Applicable MD performing test: R. Rothbart & Joni Reining NP Family MD: Dr. Juanetta Gosling Test explained and consent signed: yes IV started: 22g jelco, Saline lock flushed, No redness or edema and Saline lock started in radiology Symptoms: SOB & Fatigue Treatment/Intervention: None Reason test stopped: SOB After recovery IV was: Discontinued via X-ray tech and No redness or edema Patient to return to Nuc. Med at : 12:45 Patient discharged: Home Patient's Condition upon discharge was: stable Comments: During test peak BP 163/67  & HR 148.  Recovery BP 125/53 & HR 87. Symptoms resolved in recovery. [name] T

## 2012-09-12 ENCOUNTER — Encounter (INDEPENDENT_AMBULATORY_CARE_PROVIDER_SITE_OTHER): Payer: Self-pay | Admitting: Surgery

## 2012-09-12 ENCOUNTER — Ambulatory Visit (INDEPENDENT_AMBULATORY_CARE_PROVIDER_SITE_OTHER): Payer: PRIVATE HEALTH INSURANCE | Admitting: Surgery

## 2012-09-12 VITALS — BP 138/87 | HR 64 | Temp 97.4°F | Resp 12 | Ht 66.0 in | Wt 247.8 lb

## 2012-09-12 DIAGNOSIS — Z9884 Bariatric surgery status: Secondary | ICD-10-CM

## 2012-09-12 NOTE — Progress Notes (Signed)
Lapband Fill Encounter Problem List:   Patient Active Problem List  Diagnosis  . Dysphagia  . Nonspecific (abnormal) findings on radiological and other examination of gastrointestinal tract  . Foreign body in stomach  . Lapband in [facility] 2009  . Chest pain  . COPD exacerbation  . Diabetes    Terri [name] Body mass index is 40.01 kg/(m^2). Weight loss since surgery  Had surgery in [place]  Having regurgitation?:  sometimes  Feel that they need a fill?  Not sure  Nocturnal reflux? none  Amount of fill  none   Port site: Not accessed  Instructions given and weight loss goals discussed.  At times when she eats too fast she spits out. I think we are near a point where she may develop maladaptive eating. I talked to her about eating more slowly and taking smaller bites. I think it is worthwhile trying to do that. She's had a lot of stress recently and chest pain and thought that with her heart she had a negative cardiac workup. He could be the distress his aggravating her esophageal dysfunction as well as her band function. I then did be best to hold off on a fill for now I'll see her back in 3 months.  Matt B. Daphine Deutscher, MD, FACS

## 2012-09-12 NOTE — Patient Instructions (Signed)
Stay on low carb diet.  Avoid sweet salad dressings

## 2012-11-04 ENCOUNTER — Other Ambulatory Visit (HOSPITAL_COMMUNITY): Payer: Self-pay | Admitting: Pulmonary Disease

## 2012-11-04 DIAGNOSIS — G959 Disease of spinal cord, unspecified: Secondary | ICD-10-CM

## 2012-11-11 ENCOUNTER — Ambulatory Visit (HOSPITAL_COMMUNITY)
Admission: RE | Admit: 2012-11-11 | Discharge: 2012-11-11 | Disposition: A | Discharge status: Home / Self Care | Payer: PRIVATE HEALTH INSURANCE | Source: Ambulatory Visit | Attending: Pulmonary Disease | Admitting: Pulmonary Disease

## 2012-11-11 ENCOUNTER — Encounter (HOSPITAL_COMMUNITY): Payer: Self-pay

## 2012-11-11 DIAGNOSIS — M542 Cervicalgia: Secondary | ICD-10-CM | POA: Insufficient documentation

## 2012-11-11 DIAGNOSIS — R209 Unspecified disturbances of skin sensation: Secondary | ICD-10-CM | POA: Insufficient documentation

## 2012-11-11 DIAGNOSIS — M5020 Other cervical disc displacement, unspecified cervical region: Secondary | ICD-10-CM | POA: Insufficient documentation

## 2012-11-11 DIAGNOSIS — M47812 Spondylosis without myelopathy or radiculopathy, cervical region: Secondary | ICD-10-CM | POA: Insufficient documentation

## 2012-11-11 DIAGNOSIS — G959 Disease of spinal cord, unspecified: Secondary | ICD-10-CM

## 2012-12-02 ENCOUNTER — Encounter (INDEPENDENT_AMBULATORY_CARE_PROVIDER_SITE_OTHER): Payer: Self-pay | Admitting: General Surgery

## 2012-12-02 ENCOUNTER — Telehealth (INDEPENDENT_AMBULATORY_CARE_PROVIDER_SITE_OTHER): Payer: Self-pay

## 2012-12-02 ENCOUNTER — Ambulatory Visit (INDEPENDENT_AMBULATORY_CARE_PROVIDER_SITE_OTHER): Payer: PRIVATE HEALTH INSURANCE | Admitting: General Surgery

## 2012-12-02 VITALS — BP 140/76 | HR 60 | Temp 96.6°F | Resp 12 | Ht 66.0 in | Wt 241.6 lb

## 2012-12-02 DIAGNOSIS — Z4651 Encounter for fitting and adjustment of gastric lap band: Secondary | ICD-10-CM

## 2012-12-02 DIAGNOSIS — R1310 Dysphagia, unspecified: Secondary | ICD-10-CM

## 2012-12-02 DIAGNOSIS — Z9884 Bariatric surgery status: Secondary | ICD-10-CM

## 2012-12-02 NOTE — Assessment & Plan Note (Addendum)
All fluid removed (1.3 mL) by Dr. Andrey Campanile. He attempted partial removal, but she was still unable to swallow liquids.    She was able to drink water after complete evacuation of port.    Follow up with Dr. Daphine Deutscher as scheduled 7.31.

## 2012-12-02 NOTE — Progress Notes (Signed)
HISTORY: Pt is a 47 yo F who presents with inability to swallow.  She had lap band placed in 2009 in [place] and moved here.  Dr. Daphine Deutscher has been following her since last august.  She was having issues at that time, and he removed all the fluid.  When she came back in September, he placed 0.5 ml, and another 0.5 mL in October.  In April, she was doing well and losing weight.  No additional fluid was placed.  Yesterday, she ate chicken and dumplings at [employer].  Since then, she has felt something stuck in her neck/chest. She cannot keep her saliva down.  She has not been able to swallow any water or anything.  She tried to throw up, but did not get anything out.  She denies fevers/ chills.  She is not having any abdominal pain.    PERTINENT REVIEW OF SYSTEMS: Dysuria and hematuria - uti dx yesterday at pcp, on antibiotic and azo.    Filed Vitals:   12/02/12 1517  BP: 140/76  Pulse: 60  Temp: 96.6 F (35.9 C)  Resp: 12   Filed Weights   12/02/12 1517  Weight: 241 lb 9.6 oz (109.589 kg)   EXAM: Head: Normocephalic and atraumatic.  Eyes:  Conjunctivae are normal. Pupils are equal, round, and reactive to light. No scleral icterus.  Resp: No respiratory distress, normal effort. Abd:  Abdomen is soft, non distended and non tender. No masses are palpable.  There is no rebound and no guarding.  Neurological: Alert and oriented to person, place, and time. Coordination normal.  Skin: Skin is warm and dry. No rash noted. No diaphoretic. No erythema. No pallor.  Psychiatric: Normal mood and affect. Normal behavior. Judgment and thought content normal.      ASSESSMENT AND PLAN:   Lapband in [facility] 2009 All fluid removed (1.3 mL) by Dr. Andrey Campanile. He attempted partial removal, but she was still unable to swallow liquids.    She was able to drink water after complete evacuation of port.    Follow up with Dr. Daphine Deutscher as scheduled 7.31.    Maudry Diego, MD Surgical Oncology, General &  Endocrine Surgery [facility]  Fredirick Maudlin, MD Fredirick Maudlin, MD

## 2012-12-02 NOTE — Patient Instructions (Signed)
Follow up with Dr. Daphine Deutscher on the 31 as scheduled.    Call if questions.

## 2012-12-02 NOTE — Telephone Encounter (Signed)
Patient states her Lap band is too tight she can not drink water. Appointment made in urg ov today with Dr. Donell Beers 3:15 pm

## 2012-12-04 NOTE — Progress Notes (Signed)
See Dr Arita Miss note  Alert, nad abd -soft, nt, nd. Port LUQ - slight angulation  After obtaining verbal consent, the abdominal wall was prepped with Chloraprep. The port was accessed with a Huber needle and 0.5 cc of saline was removed.  The patient was NOT able to tolerate sips of water. Therefore the port was re-accessed with a Huber needle and all remaining fluid was removed, approx 1.3 cc.   The port was somewhat technically challenging to access. The port was located inferior to her left upper quadrant scar and slightly at the lateral aspect of the scar. The port was slightly rotated as well.  I discussed proper eating techniques with the patient. I discussed the importance of waiting 20-30 seconds between taking bites of food. I discussed using an infant size utensil. I discussed using her nondominant hand to eat with. Also discussed the importance of taking one bite at that time.  Follow up Dr. Daphine Deutscher as previously scheduled  Mary Sella. Andrey Campanile, MD, FACS General, Bariatric, & Minimally Invasive Surgery [facility] Surgery, [place]

## 2013-01-01 ENCOUNTER — Encounter (INDEPENDENT_AMBULATORY_CARE_PROVIDER_SITE_OTHER): Payer: PRIVATE HEALTH INSURANCE | Admitting: Surgery

## 2013-01-22 ENCOUNTER — Emergency Department (HOSPITAL_COMMUNITY)
Admission: EM | Admit: 2013-01-22 | Discharge: 2013-01-22 | Disposition: A | Discharge status: Home / Self Care | Payer: PRIVATE HEALTH INSURANCE | Attending: Emergency Medicine | Admitting: Emergency Medicine

## 2013-01-22 ENCOUNTER — Encounter (HOSPITAL_COMMUNITY): Payer: Self-pay | Admitting: Emergency Medicine

## 2013-01-22 ENCOUNTER — Encounter (HOSPITAL_COMMUNITY)
Admission: EM | Disposition: A | Discharge status: Home / Self Care | Payer: Self-pay | Source: Home / Self Care | Attending: Emergency Medicine

## 2013-01-22 DIAGNOSIS — Z8739 Personal history of other diseases of the musculoskeletal system and connective tissue: Secondary | ICD-10-CM | POA: Insufficient documentation

## 2013-01-22 DIAGNOSIS — Z880 Allergy status to penicillin: Secondary | ICD-10-CM | POA: Insufficient documentation

## 2013-01-22 DIAGNOSIS — K219 Gastro-esophageal reflux disease without esophagitis: Secondary | ICD-10-CM | POA: Insufficient documentation

## 2013-01-22 DIAGNOSIS — J449 Chronic obstructive pulmonary disease, unspecified: Secondary | ICD-10-CM | POA: Insufficient documentation

## 2013-01-22 DIAGNOSIS — IMO0002 Reserved for concepts with insufficient information to code with codable children: Secondary | ICD-10-CM | POA: Insufficient documentation

## 2013-01-22 DIAGNOSIS — E669 Obesity, unspecified: Secondary | ICD-10-CM | POA: Insufficient documentation

## 2013-01-22 DIAGNOSIS — E119 Type 2 diabetes mellitus without complications: Secondary | ICD-10-CM | POA: Insufficient documentation

## 2013-01-22 DIAGNOSIS — T182XXA Foreign body in stomach, initial encounter: Secondary | ICD-10-CM

## 2013-01-22 DIAGNOSIS — R1310 Dysphagia, unspecified: Secondary | ICD-10-CM | POA: Insufficient documentation

## 2013-01-22 DIAGNOSIS — Z7982 Long term (current) use of aspirin: Secondary | ICD-10-CM | POA: Insufficient documentation

## 2013-01-22 DIAGNOSIS — T189XXA Foreign body of alimentary tract, part unspecified, initial encounter: Secondary | ICD-10-CM | POA: Insufficient documentation

## 2013-01-22 DIAGNOSIS — Y939 Activity, unspecified: Secondary | ICD-10-CM | POA: Insufficient documentation

## 2013-01-22 DIAGNOSIS — Z9884 Bariatric surgery status: Secondary | ICD-10-CM

## 2013-01-22 DIAGNOSIS — Y929 Unspecified place or not applicable: Secondary | ICD-10-CM | POA: Insufficient documentation

## 2013-01-22 DIAGNOSIS — M129 Arthropathy, unspecified: Secondary | ICD-10-CM | POA: Insufficient documentation

## 2013-01-22 DIAGNOSIS — Z79899 Other long term (current) drug therapy: Secondary | ICD-10-CM | POA: Insufficient documentation

## 2013-01-22 DIAGNOSIS — J4489 Other specified chronic obstructive pulmonary disease: Secondary | ICD-10-CM | POA: Insufficient documentation

## 2013-01-22 HISTORY — PX: ESOPHAGOGASTRODUODENOSCOPY: SHX5428

## 2013-01-22 SURGERY — EGD (ESOPHAGOGASTRODUODENOSCOPY)
Anesthesia: Moderate Sedation

## 2013-01-22 MED ORDER — BUTAMBEN-TETRACAINE-BENZOCAINE 2-2-14 % EX AERO
INHALATION_SPRAY | CUTANEOUS | Status: DC | PRN
  Administered 2013-01-22: 1 via TOPICAL

## 2013-01-22 MED ORDER — SODIUM CHLORIDE 0.9 % IV SOLN
INTRAVENOUS | Status: DC
  Administered 2013-01-22: 14:00:00 via INTRAVENOUS

## 2013-01-22 MED ORDER — FENTANYL CITRATE 0.05 MG/ML IJ SOLN
INTRAMUSCULAR | Status: DC | PRN
  Administered 2013-01-22: 12.5 ug via INTRAVENOUS
  Administered 2013-01-22: 25 ug via INTRAVENOUS

## 2013-01-22 MED ORDER — MIDAZOLAM HCL 10 MG/2ML IJ SOLN
INTRAMUSCULAR | Status: DC | PRN
  Administered 2013-01-22 (×3): 2 mg via INTRAVENOUS

## 2013-01-22 NOTE — Op Note (Signed)
[name] [address] [place], [address]   ENDOSCOPY PROCEDURE REPORT  PATIENT: Terri, [name]  MR#: [id number] BIRTHDATE: Oct 27, 1965 , 46  yrs. old GENDER: Female ENDOSCOPIST: Beverley Fiedler, MD REFERRED BY:  Loren Racer, MD (ED MD) PROCEDURE DATE:  01/22/2013 PROCEDURE:  EGD w/ fb removal ASA CLASS:     Class II INDICATIONS:  Dysphagia.   Foreign body removal from stomach.  EGD with Dr. Leone Payor August 2009 food in proximal stomach, EGD with Dr. Russella Dar November 2009 food in proximal stomach; EGDs with Dr. Christella Hartigan January 2012, June 2013, in July 2013 all for food at lap band MEDICATIONS: These medications were titrated to patient response per physician's verbal order, Fentanyl 37.5 mcg IV, and Versed 6 mg IV  TOPICAL ANESTHETIC: Cetacaine Spray  DESCRIPTION OF PROCEDURE: After the risks benefits and alternatives of the procedure were thoroughly explained, informed consent was obtained.  The PENTAX GASTOROSCOPE [id number] endoscope was introduced through the mouth and advanced to the second portion of the duodenum. Without limitations.  The instrument was slowly withdrawn as the mucosa was fully examined.        ESOPHAGUS: The mucosa of the esophagus appeared normal.   An irregular Z-line was observed 38 cm from the incisors.   Attention on follow-up  STOMACH: A foreign body (meat bolus) was found in the cardia at the level of the lap band.  This was able to be gently advanced past the lap-band with easy.   A banded gastroplasty was found in the cardia, best seen on retroflexion.   The stomach otherwise appeared normal.  DUODENUM: The duodenal mucosa showed no abnormalities in the bulb and second portion of the duodenum.  Retroflexed views revealed as described above.     The scope was then withdrawn from the patient and the procedure completed.  COMPLICATIONS: There were no complications.     ENDOSCOPIC IMPRESSION: 1.   The  mucosa of the esophagus appeared normal 2.   Irregular Z-line was observed 38 cm from the incisors; attention on follow-up 3.   Meat impaction was found in the cardia at level of lap band 4.   Banded gastroplasty was found in the cardia 5.   The stomach otherwise appeared normal 6.   The duodenal mucosa showed no abnormalities in the bulb and second portion of the duodenum  RECOMMENDATIONS: 1.  Surgical follow-up to discuss removal of previously placed lap-band given multiple food impactions requiring EGD and persistence of dysphagia symptoms with eating. 2.  Chew food extremely well, take small bites and eat slowly   eSigned:  Beverley Fiedler, MD 01/22/2013 2:54 PM   XB:[id number] Daphine Deutscher, MD  PATIENT NAME:  Terri, [name] MR#: [id number]

## 2013-01-22 NOTE — ED Provider Notes (Signed)
CSN: [id number]     Arrival date & time 01/22/13  1048 History     First MD Initiated Contact with Patient 01/22/13 1105     No chief complaint on file.  (Consider location/radiation/quality/duration/timing/severity/associated sxs/prior Treatment) The history is provided by the patient and medical records. No language interpreter was used.    Terri [name] is a 47 y.o. female  with a hx of COPD, DM, GERD, obesity, asthma, vertigo, lab band placement in 2007 ([place], [place]) presents to the Emergency Department complaining of symptoms consistent with a food bolus impaction which began at 5 PM yesterday.  She thought it would improve with rest of induction of emesis, but symptoms persisted and today she has been able unable to eat or drink without vomiting. She also states she is not able to keep her saliva down either. She has had 4 endoscopies for the same thing. Twice she had bolus impaction in the other 2 times were sphincter spasm.  Pt is followed by Dr Luretha Murphy of the bariatric clinic for her swallowing issues and has seen Rob Bunting in the past for these food bolus impactions.  Pt reports that when this happens GI must do an endoscopy to "push the food down."  Nothing makes it better or worse.  Pt denies fever, chills, headache, neck pain, shortness of breath, abdominal pain diarrhea, weakness, dizziness, syncope, dysuria and hematuria.   Past Medical History  Diagnosis Date  . COPD (chronic obstructive pulmonary disease)   . Diabetes mellitus   . GERD (gastroesophageal reflux disease)   . Arthritis   . Obesity   . Asthma   . Osteoporosis   . Vertigo    Past Surgical History  Procedure Laterality Date  . Laparoscopic gastric banding    . Hernia repair    . Tubal ligation    . Bladder surgery    . Esophagogastroduodenoscopy  11/17/2011    Procedure: ESOPHAGOGASTRODUODENOSCOPY (EGD);  Surgeon: Rachael Fee, MD;  Location: [facility] ENDOSCOPY;  Service: Endoscopy;  Laterality:  N/A;  . Esophagogastroduodenoscopy  01/02/2012    Procedure: ESOPHAGOGASTRODUODENOSCOPY (EGD);  Surgeon: Rachael Fee, MD;  Location: [facility] ENDOSCOPY;  Service: Endoscopy;  Laterality: N/A;   Family History  Problem Relation Age of Onset  . Colon cancer Neg Hx   . Breast cancer Mother   . Diabetes Mother     Sister and Father  . Cancer Mother     breast  . COPD Mother   . Heart disease Father     and Sister  . Cirrhosis Father     Non-alcholic   . Diabetes Father   . Hypertension Father   . Kidney disease Father     chronic kidney stones  . Diabetes Sister   . Diabetes Brother    History  Substance Use Topics  . Smoking status: Former Smoker    Quit date: 11/16/2005  . Smokeless tobacco: Former Neurosurgeon  . Alcohol Use: Yes     Comment: occasion - once or twice a year   OB History   Grav Para Term Preterm Abortions TAB SAB Ect Mult Living                 Review of Systems  Constitutional: Negative for fever, diaphoresis, appetite change, fatigue and unexpected weight change.  HENT: Positive for trouble swallowing. Negative for mouth sores and neck stiffness.   Eyes: Negative for visual disturbance.  Respiratory: Negative for cough, chest tightness, shortness of breath and wheezing.  Cardiovascular: Negative for chest pain.  Gastrointestinal: Negative for nausea, vomiting, abdominal pain, diarrhea and constipation.  Endocrine: Negative for polydipsia, polyphagia and polyuria.  Genitourinary: Negative for dysuria, urgency, frequency and hematuria.  Musculoskeletal: Negative for back pain.  Skin: Negative for rash.  Allergic/Immunologic: Negative for immunocompromised state.  Neurological: Negative for syncope, light-headedness and headaches.  Hematological: Does not bruise/bleed easily.  Psychiatric/Behavioral: Negative for sleep disturbance. The patient is not nervous/anxious.     Allergies  Sulfa antibiotics; Cephalexin; Ciprofloxacin hcl; and Penicillins  Home  Medications   Current Outpatient Rx  Name  Route  Sig  Dispense  Refill  . albuterol (PROVENTIL HFA;VENTOLIN HFA) 108 (90 BASE) MCG/ACT inhaler   Inhalation   Inhale 2 puffs into the lungs every 6 (six) hours as needed for wheezing or shortness of breath.         Marland Kitchen aspirin EC 81 MG tablet   Oral   Take 162 mg by mouth daily as needed for pain.         Marland Kitchen glipiZIDE-metformin (METAGLIP) 5-500 MG per tablet   Oral   Take 2 tablets by mouth 2 (two) times daily before a meal.         . ipratropium-albuterol (DUONEB) 0.5-2.5 (3) MG/3ML SOLN   Inhalation   Inhale 3 mLs into the lungs every 6 (six) hours as needed.          Marland Kitchen omeprazole (PRILOSEC) 20 MG capsule   Oral   Take 20 mg by mouth daily as needed (Acid Reflux). Acid reflux....         . Phentermine-Topiramate 3.75-23 MG CP24   Oral   Take 1 capsule by mouth daily with breakfast.         . sertraline (ZOLOFT) 50 MG tablet   Oral   Take 50 mg by mouth daily.          BP 119/51  Pulse 69  Temp(Src) 98.3 F (36.8 C)  Resp 16  SpO2 96%  LMP 07/30/2010 Physical Exam  Nursing note and vitals reviewed. Constitutional: She appears well-developed and well-nourished. No distress.  Awake, alert, nontoxic appearance  HENT:  Head: Normocephalic and atraumatic.  Mouth/Throat: Oropharynx is clear and moist. No oropharyngeal exudate.  Eyes: Conjunctivae and EOM are normal. Pupils are equal, round, and reactive to light. No scleral icterus.  Neck: Normal range of motion. Neck supple.  No stridor Pt spitting secretions into a bag  Cardiovascular: Normal rate, regular rhythm, normal heart sounds and intact distal pulses.   No murmur heard. Pulmonary/Chest: Effort normal and breath sounds normal. No respiratory distress. She has no wheezes. She has no rales.  Abdominal: Soft. Bowel sounds are normal. She exhibits no distension. There is no tenderness. There is no rebound.  Soft and nontender  Musculoskeletal: Normal  range of motion. She exhibits no edema.  Lymphadenopathy:    She has no cervical adenopathy.  Neurological: She is alert. She exhibits normal muscle tone. Coordination normal.  Speech is clear and goal oriented Moves extremities without ataxia  Skin: Skin is warm and dry. She is not diaphoretic. No erythema.  Psychiatric: She has a normal mood and affect.    ED Course   Procedures (including critical care time)  Labs Reviewed - No data to display No results found. 1. Dysphagia   2. Lapband in [facility] 2009     MDM  Terri [name] presents with symptoms consistent with a food bolus impaction.  Pt has hx of the  same with EGD by Rob Bunting in the past.  Patient not tolerating her secretions at this time. Patent airway patent, no stridor.  On record review of patient's lap band was completely deflated by Dr. Andrey Campanile on 12/02/2012.  She was informed that time that further food bolus impactions would need to be evaluated and treated in the emergency department and by GI.  Since last EGD was 11/17/2011 by Dr. Rob Bunting.  Will consult GI for EGD today.  12:23 PM GI will evaluate patient.  1:01 PM GI to schedule EGD for today.  Pt stable at this time.    Dr. Loren Racer was consulted, evaluated this patient with me and agrees with the plan.    2:28 PM GI with EGD in the department and pushed the food impaction through; Dr Rhea Belton reports the impaction was at the level of the band.  Pt tolerated procedure without difficulty.  Will observe for approx 1 hour and then PO trial.    4:14 PM Patient tolerated solids and liquids without difficulty. Vitals stable. Patient is to followup with general surgery for discussion of lap band removal.  I have also discussed reasons to return immediately to the ER.  Patient expresses understanding and agrees with plan.  Dr. Loren Racer was consulted, evaluated this patient with me and agrees with the plan.      Dierdre Forth,  PA-C 01/22/13 1302  Trayon Krantz, PA-C 01/22/13 1615  Anniebelle Devore, PA-C 01/22/13 1615

## 2013-01-22 NOTE — ED Notes (Signed)
Patient tolerating food and fluids well

## 2013-01-22 NOTE — ED Notes (Signed)
Since 5 pm states that she has food stuck in throat pt breathing well and handling secreations well and drinking fluids well but staes that she still feels it there

## 2013-01-22 NOTE — ED Provider Notes (Signed)
Medical screening examination/treatment/procedure(s) were conducted as a shared visit with non-physician practitioner(s) and myself.  I personally evaluated the patient during the encounter Pt with recurrent esophageal impactions p/w similar symptoms starting yesterday after eating Hortense Ramal. Seen by Dr Rhea Belton. EGD in ED with resolution of of impaction. Pt stable for D/c  Loren Racer, MD 01/22/13 [phone]

## 2013-01-22 NOTE — Consult Note (Signed)
Patient seen, examined, and I agree with the above documentation, including the assessment and plan. Likely recurrent food impaction, likely at previously placed gastric band. Dr. Daphine Deutscher completed deflated the band about 1 month ago. EGD now to relieve impaction The nature of the procedure, as well as the risks, benefits, and alternatives were carefully and thoroughly reviewed with the patient. Ample time for discussion and questions allowed. The patient understood, was satisfied, and agreed to proceed.  If impaction is at the level of the band, I recommend having it removed completely
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
## 2013-01-22 NOTE — ED Notes (Signed)
Pt reports yesterday around 5pm she was eating when she got a piece of food stuck. sts she attempted to throw it up and swallow it down but nothing worked, tried to sleep it off and just relax to see if it would eventually go down. sts this has happened multiple times in the past and sometimes it corrects itself, others she has to go to hospital to have it removed/pushed down. Pt sts she has Lap Band sx and has had a few issues with that and getting food stuck/her stomach not emptying quick enough. Pt sts that she went 2 weeks ago to her Lap band physician for a similar issue and they removed all the fluid and she felt much better. Pt in nad, skin warm and dry, resp e/u. Pt speaking in full sentences.

## 2013-01-23 ENCOUNTER — Encounter (HOSPITAL_COMMUNITY): Payer: Self-pay | Admitting: Internal Medicine

## 2013-02-05 ENCOUNTER — Encounter (INDEPENDENT_AMBULATORY_CARE_PROVIDER_SITE_OTHER): Payer: PRIVATE HEALTH INSURANCE | Admitting: Surgery

## 2013-03-20 ENCOUNTER — Encounter (INDEPENDENT_AMBULATORY_CARE_PROVIDER_SITE_OTHER): Payer: PRIVATE HEALTH INSURANCE | Admitting: Surgery

## 2013-04-09 ENCOUNTER — Other Ambulatory Visit: Payer: Self-pay

## 2013-08-31 ENCOUNTER — Ambulatory Visit: Payer: Self-pay | Admitting: Obstetrics & Gynecology

## 2013-09-23 ENCOUNTER — Ambulatory Visit: Payer: Self-pay | Admitting: Obstetrics & Gynecology

## 2013-09-30 ENCOUNTER — Ambulatory Visit: Payer: PRIVATE HEALTH INSURANCE | Admitting: Obstetrics & Gynecology

## 2013-10-02 ENCOUNTER — Ambulatory Visit: Payer: PRIVATE HEALTH INSURANCE | Admitting: Obstetrics & Gynecology

## 2013-10-19 ENCOUNTER — Ambulatory Visit: Payer: PRIVATE HEALTH INSURANCE | Admitting: Obstetrics & Gynecology

## 2013-10-22 ENCOUNTER — Encounter (HOSPITAL_COMMUNITY)
Admission: EM | Disposition: A | Discharge status: Home / Self Care | Payer: Self-pay | Source: Home / Self Care | Attending: Emergency Medicine

## 2013-10-22 ENCOUNTER — Emergency Department (HOSPITAL_COMMUNITY)
Admission: EM | Admit: 2013-10-22 | Discharge: 2013-10-22 | Disposition: A | Discharge status: Home / Self Care | Payer: PRIVATE HEALTH INSURANCE | Attending: Emergency Medicine | Admitting: Emergency Medicine

## 2013-10-22 ENCOUNTER — Encounter (HOSPITAL_COMMUNITY): Payer: Self-pay | Admitting: Emergency Medicine

## 2013-10-22 DIAGNOSIS — T18108A Unspecified foreign body in esophagus causing other injury, initial encounter: Secondary | ICD-10-CM | POA: Diagnosis not present

## 2013-10-22 DIAGNOSIS — E119 Type 2 diabetes mellitus without complications: Secondary | ICD-10-CM | POA: Diagnosis not present

## 2013-10-22 DIAGNOSIS — E669 Obesity, unspecified: Secondary | ICD-10-CM | POA: Diagnosis not present

## 2013-10-22 DIAGNOSIS — K209 Esophagitis, unspecified without bleeding: Secondary | ICD-10-CM | POA: Insufficient documentation

## 2013-10-22 DIAGNOSIS — J449 Chronic obstructive pulmonary disease, unspecified: Secondary | ICD-10-CM | POA: Insufficient documentation

## 2013-10-22 DIAGNOSIS — Z87891 Personal history of nicotine dependence: Secondary | ICD-10-CM | POA: Insufficient documentation

## 2013-10-22 DIAGNOSIS — IMO0002 Reserved for concepts with insufficient information to code with codable children: Secondary | ICD-10-CM | POA: Insufficient documentation

## 2013-10-22 DIAGNOSIS — R1310 Dysphagia, unspecified: Secondary | ICD-10-CM

## 2013-10-22 DIAGNOSIS — M129 Arthropathy, unspecified: Secondary | ICD-10-CM | POA: Diagnosis not present

## 2013-10-22 DIAGNOSIS — T182XXA Foreign body in stomach, initial encounter: Secondary | ICD-10-CM

## 2013-10-22 DIAGNOSIS — Z9884 Bariatric surgery status: Secondary | ICD-10-CM | POA: Diagnosis not present

## 2013-10-22 DIAGNOSIS — K219 Gastro-esophageal reflux disease without esophagitis: Secondary | ICD-10-CM | POA: Insufficient documentation

## 2013-10-22 DIAGNOSIS — M810 Age-related osteoporosis without current pathological fracture: Secondary | ICD-10-CM | POA: Insufficient documentation

## 2013-10-22 DIAGNOSIS — J4489 Other specified chronic obstructive pulmonary disease: Secondary | ICD-10-CM | POA: Insufficient documentation

## 2013-10-22 DIAGNOSIS — T18128A Food in esophagus causing other injury, initial encounter: Secondary | ICD-10-CM

## 2013-10-22 HISTORY — PX: ESOPHAGOGASTRODUODENOSCOPY: SHX5428

## 2013-10-22 LAB — GLUCOSE, CAPILLARY: Glucose-Capillary: 169 mg/dL — ABNORMAL HIGH (ref 70–99)

## 2013-10-22 SURGERY — EGD (ESOPHAGOGASTRODUODENOSCOPY)
Anesthesia: Moderate Sedation

## 2013-10-22 MED ORDER — MIDAZOLAM HCL 5 MG/ML IJ SOLN
INTRAMUSCULAR | Status: AC
  Filled 2013-10-22: qty 2

## 2013-10-22 MED ORDER — DIPHENHYDRAMINE HCL 50 MG/ML IJ SOLN
INTRAMUSCULAR | Status: AC
  Filled 2013-10-22: qty 1

## 2013-10-22 MED ORDER — BUTAMBEN-TETRACAINE-BENZOCAINE 2-2-14 % EX AERO
INHALATION_SPRAY | CUTANEOUS | Status: DC | PRN
  Administered 2013-10-22: 2 via TOPICAL

## 2013-10-22 MED ORDER — MIDAZOLAM HCL 10 MG/2ML IJ SOLN
INTRAMUSCULAR | Status: DC | PRN
  Administered 2013-10-22 (×3): 2 mg via INTRAVENOUS

## 2013-10-22 MED ORDER — SODIUM CHLORIDE 0.9 % IV SOLN
INTRAVENOUS | Status: DC
  Administered 2013-10-22: 500 mL via INTRAVENOUS

## 2013-10-22 MED ORDER — FENTANYL CITRATE 0.05 MG/ML IJ SOLN
INTRAMUSCULAR | Status: AC
  Filled 2013-10-22: qty 2

## 2013-10-22 MED ORDER — GLUCAGON HCL (RDNA) 1 MG IJ SOLR
1.0000 mg | Freq: Once | INTRAMUSCULAR | Status: AC
  Administered 2013-10-22: 1 mg via INTRAMUSCULAR
  Filled 2013-10-22: qty 1

## 2013-10-22 MED ORDER — FENTANYL CITRATE 0.05 MG/ML IJ SOLN
INTRAMUSCULAR | Status: DC | PRN
  Administered 2013-10-22 (×2): 25 ug via INTRAVENOUS

## 2013-10-22 NOTE — Op Note (Signed)
[facility] [place], [id number]   ENDOSCOPY PROCEDURE REPORT  PATIENT: Terri [name], Terri [name]  MR#: [id number] BIRTHDATE: April 28, 1966 , 47  yrs. old GENDER: Female ENDOSCOPIST: Ladene Artist, MD, [facility] REFERRED BY:  [facility] ED PROCEDURE DATE:  10/22/2013 PROCEDURE:  EGD w/ fb removal ASA CLASS:     Class II INDICATIONS:  Dysphagia. MEDICATIONS: These medications were titrated to patient response per physician's verbal order, Fentanyl 50 mcg IV, and Versed 6 mg IV TOPICAL ANESTHETIC: Cetacaine Spray DESCRIPTION OF PROCEDURE: After the risks benefits and alternatives of the procedure were thoroughly explained, informed consent was obtained.  The Pentax Gastroscope [id number] endoscope was introduced through the mouth and advanced to the second portion of the duodenum. Without limitations.  The instrument was slowly withdrawn as the mucosa was fully examined.  ESOPHAGUS: There was LA Class A esophagitis noted.   The esophagus was otherwise normal. STOMACH: A foreign body (meat impaction) was found in the cardia at the level of changes c/w a banded gastroplasty.  I passed the narrowed segment and then gently advanced the entire meat impaction into the distal stomach. The remainder of the stomach was appeared normal. DUODENUM: The duodenal mucosa showed no abnormalities in the bulb and second portion of the duodenum.  Retroflexed views revealed changes c/w a prior gastroplasty.     The scope was then withdrawn from the patient and the procedure completed. COMPLICATIONS: There were no complications.  ENDOSCOPIC IMPRESSION: 1.   LA Class A esophagitis 2.   Foreign body (meat impaction) in the gastric cardia; removed 3.   Banded gastroplasty in gastric cardia.  RECOMMENDATIONS: 1.  Anti-reflux regimen 2.  PPI daily: Prilosec OTC 20 mg po daily 3.  Follow up with a bariatic surgeon in the near future 4.  Soft foods only until bariatric  surgeon evaluates  eSigned:  Ladene Artist, MD, [facility] 10/22/2013 5:08 PM   CC: Silvano Rusk, MD

## 2013-10-22 NOTE — ED Provider Notes (Signed)
CSN: [id number]     Arrival date & time 10/22/13  1257 History   First MD Initiated Contact with Patient 10/22/13 1418     Chief Complaint  Patient presents with  . Foreign Body     (Consider location/radiation/quality/duration/timing/severity/associated sxs/prior Treatment) Patient is a 48 y.o. female presenting with foreign body. The history is provided by the patient. No language interpreter was used.  Foreign Body Location:  Swallowed Suspected object:  Food Pain quality:  Sharp Pain severity:  Moderate Ineffective treatments:  Coughing and drinking Associated symptoms: no abdominal pain and no nausea   Associated symptoms comment:  Symptoms of recurrent esophageal food impaction since yesterday after eating chicken. She reports a history of multiple impactions since lap band surgery in 2009 even with the band fully deflated. No fever. No bleeding.    Past Medical History  Diagnosis Date  . COPD (chronic obstructive pulmonary disease)   . Diabetes mellitus   . GERD (gastroesophageal reflux disease)   . Arthritis   . Obesity   . Asthma   . Osteoporosis   . Vertigo    Past Surgical History  Procedure Laterality Date  . Laparoscopic gastric banding    . Hernia repair    . Tubal ligation    . Bladder surgery    . Esophagogastroduodenoscopy  11/17/2011    Procedure: ESOPHAGOGASTRODUODENOSCOPY (EGD);  Surgeon: Milus Banister, MD;  Location: [place];  Service: Endoscopy;  Laterality: N/A;  . Esophagogastroduodenoscopy  01/02/2012    Procedure: ESOPHAGOGASTRODUODENOSCOPY (EGD);  Surgeon: Milus Banister, MD;  Location: [place];  Service: Endoscopy;  Laterality: N/A;  . Esophagogastroduodenoscopy N/A 01/22/2013    Procedure: ESOPHAGOGASTRODUODENOSCOPY (EGD);  Surgeon: Jerene Bears, MD;  Location: [place];  Service: Gastroenterology;  Laterality: N/A;   Family History  Problem Relation Age of Onset  . Colon cancer Neg Hx   . Breast cancer Mother   . Diabetes  Mother     Sister and Father  . Cancer Mother     breast  . COPD Mother   . Heart disease Father     and Sister  . Cirrhosis Father     Non-alcholic   . Diabetes Father   . Hypertension Father   . Kidney disease Father     chronic kidney stones  . Diabetes Sister   . Diabetes Brother    History  Substance Use Topics  . Smoking status: Former Smoker    Quit date: 11/16/2005  . Smokeless tobacco: Former Systems developer  . Alcohol Use: Yes     Comment: occasion - once or twice a year   OB History   Grav Para Term Preterm Abortions TAB SAB Ect Mult Living                 Review of Systems  Constitutional: Negative for fever.  Respiratory: Negative for shortness of breath, wheezing and stridor.   Cardiovascular: Positive for chest pain.  Gastrointestinal: Negative for nausea and abdominal pain.      Allergies  Sulfa antibiotics; Cephalexin; Ciprofloxacin hcl; and Penicillins  Home Medications   Prior to Admission medications   Medication Sig Start Date End Date Taking? Authorizing Provider  glipiZIDE-metformin (METAGLIP) 5-500 MG per tablet Take 1 tablet by mouth at bedtime.    Yes Historical Provider, MD  ibuprofen (ADVIL,MOTRIN) 800 MG tablet Take 800 mg by mouth daily as needed for moderate pain.   Yes Historical Provider, MD  sertraline (ZOLOFT) 50 MG tablet Take 50 mg  by mouth daily.   Yes Historical Provider, MD  albuterol (PROVENTIL HFA;VENTOLIN HFA) 108 (90 BASE) MCG/ACT inhaler Inhale 2 puffs into the lungs every 6 (six) hours as needed for wheezing or shortness of breath.    Historical Provider, MD  ipratropium-albuterol (DUONEB) 0.5-2.5 (3) MG/3ML SOLN Inhale 3 mLs into the lungs every 6 (six) hours as needed.  09/10/12   Historical Provider, MD   BP 141/56  Pulse 79  Temp(Src) 98.6 F (37 C) (Oral)  Resp 22  Ht 5\' 7"  (1.702 m)  Wt 241 lb 11.2 oz (109.634 kg)  BMI 37.85 kg/m2  SpO2 99%  LMP 07/30/2010 Physical Exam  Constitutional: She is oriented to person,  place, and time. She appears well-developed and well-nourished.  HENT:  Head: Normocephalic.  Neck: Normal range of motion. Neck supple.  Cardiovascular: Normal rate and regular rhythm.   Pulmonary/Chest: Effort normal and breath sounds normal. No stridor.  Abdominal: Soft. Bowel sounds are normal. There is no tenderness. There is no rebound and no guarding.  Musculoskeletal: Normal range of motion.  Neurological: She is alert and oriented to person, place, and time.  Skin: Skin is warm and dry.  Psychiatric: She has a normal mood and affect.    ED Course  Procedures (including critical care time) Labs Review Labs Reviewed - No data to display  Imaging Review No results found.   EKG Interpretation None      MDM   Final diagnoses:  None    1. Esophageal food impaction, recurrent  She is in NAD. Unable to tolerated liquids. Glucagon without success in relieving impaction. GI consulted.     Dewaine Oats, PA-C 10/22/13 1539

## 2013-10-22 NOTE — Discharge Instructions (Signed)
Gastrointestinal Endoscopy °Care After °Refer to this sheet in the next few weeks. These instructions provide you with information on caring for yourself after your procedure. Your caregiver may also give you more specific instructions. Your treatment has been planned according to current medical practices, but problems sometimes occur. Call your caregiver if you have any problems or questions after your procedure. °HOME CARE INSTRUCTIONS °· If you were given medicine to help you relax (sedative), do not drive, operate machinery, or sign important documents for 24 hours. °· Avoid alcohol and hot or warm beverages for the first 24 hours after the procedure. °· Only take over-the-counter or prescription medicines for pain, discomfort, or fever as directed by your caregiver. You may resume taking your normal medicines unless your caregiver tells you otherwise. Ask your caregiver when you may resume taking medicines that may cause bleeding, such as aspirin, clopidogrel, or warfarin. °· You may return to your normal diet and activities on the day after your procedure, or as directed by your caregiver. Walking may help to reduce any bloated feeling in your abdomen. °· Drink enough fluids to keep your urine clear or pale yellow. °· You may gargle with salt water if you have a sore throat. °SEEK IMMEDIATE MEDICAL CARE IF: °· You have severe nausea or vomiting. °· You have severe abdominal pain, abdominal cramps that last longer than 6 hours, or abdominal swelling (distention). °· You have severe shoulder or back pain. °· You have trouble swallowing. °· You have shortness of breath, your breathing is shallow, or you are breathing faster than normal. °· You have a fever or a rapid heartbeat. °· You vomit blood or material that looks like coffee grounds. °· You have bloody, black, or tarry stools. °MAKE SURE YOU: °· Understand these instructions. °· Will watch your condition. °· Will get help right away if you are not doing  well or get worse. °Document Released: 01/03/2004 Document Revised: 11/20/2011 Document Reviewed: 08/21/2011 °ExitCare® Patient Information ©2014 [employer]. ° °

## 2013-10-22 NOTE — H&P (Signed)
CSN: [id number] Arrival date & time 10/22/13 1257  History   First MD Initiated Contact with Patient 10/22/13 1418  Chief Complaint   Patient presents with   .  Foreign Body    (Consider location/radiation/quality/duration/timing/severity/associated sxs/prior  Treatment)  Patient is a 48 y.o. female presenting with foreign body. The history is provided by the patient. No language interpreter was used.   Foreign Body  Location: Swallowed  Suspected object: Food  Pain quality: Sharp  Pain severity: Moderate  Ineffective treatments: Coughing and drinking Associated symptoms: no abdominal pain and no nausea  Associated symptoms comment: Symptoms of recurrent food impaction since yesterday after eating chicken. She reports a history of multiple impactions since lap band surgery in 2009 even with the band fully deflated. No fever. No bleeding.   Past Medical History   Diagnosis  Date   .  COPD (chronic obstructive pulmonary disease)    .  Diabetes mellitus    .  GERD (gastroesophageal reflux disease)    .  Arthritis    .  Obesity    .  Asthma    .  Osteoporosis    .  Vertigo     Past Surgical History   Procedure  Laterality  Date   .  Laparoscopic gastric banding     .  Hernia repair     .  Tubal ligation     .  Bladder surgery     .  Esophagogastroduodenoscopy   11/17/2011     Procedure: ESOPHAGOGASTRODUODENOSCOPY (EGD); Surgeon: Milus Banister, MD; Location: [place]; Service: Endoscopy; Laterality: N/A;   .  Esophagogastroduodenoscopy   01/02/2012     Procedure: ESOPHAGOGASTRODUODENOSCOPY (EGD); Surgeon: Milus Banister, MD; Location: [place]; Service: Endoscopy; Laterality: N/A;   .  Esophagogastroduodenoscopy  N/A  01/22/2013     Procedure: ESOPHAGOGASTRODUODENOSCOPY (EGD); Surgeon: Jerene Bears, MD; Location: [place]; Service: Gastroenterology; Laterality: N/A;    Family History   Problem  Relation  Age of Onset   .  Colon cancer  Neg Hx    .  Breast cancer   Mother    .  Diabetes  Mother      Sister and Father   .  Cancer  Mother      breast   .  COPD  Mother    .  Heart disease  Father      and Sister   .  Cirrhosis  Father      Non-alcholic   .  Diabetes  Father    .  Hypertension  Father    .  Kidney disease  Father      chronic kidney stones   .  Diabetes  Sister    .  Diabetes  Brother     History   Substance Use Topics   .  Smoking status:  Former Smoker     Quit date:  11/16/2005   .  Smokeless tobacco:  Former Systems developer   .  Alcohol Use:  Yes      Comment: occasion - once or twice a year    OB History    Grav  Para  Term  Preterm  Abortions  TAB  SAB  Ect  Mult  Living                  Review of Systems  Constitutional: Negative for fever.  Respiratory: Negative for shortness of breath, wheezing and stridor.  Cardiovascular: Positive for chest pain.  Gastrointestinal: Negative for nausea and abdominal pain.   Allergies   Sulfa antibiotics; Cephalexin; Ciprofloxacin hcl; and Penicillins  Home Medications    Prior to Admission medications   Medication  Sig  Start Date  End Date  Taking?  Authorizing Provider   glipiZIDE-metformin (METAGLIP) 5-500 MG per tablet  Take 1 tablet by mouth at bedtime.    Yes  Historical Provider, MD   ibuprofen (ADVIL,MOTRIN) 800 MG tablet  Take 800 mg by mouth daily as needed for moderate pain.    Yes  Historical Provider, MD   sertraline (ZOLOFT) 50 MG tablet  Take 50 mg by mouth daily.    Yes  Historical Provider, MD   albuterol (PROVENTIL HFA;VENTOLIN HFA) 108 (90 BASE) MCG/ACT inhaler  Inhale 2 puffs into the lungs every 6 (six) hours as needed for wheezing or shortness of breath.     Historical Provider, MD   ipratropium-albuterol (DUONEB) 0.5-2.5 (3) MG/3ML SOLN  Inhale 3 mLs into the lungs every 6 (six) hours as needed.  09/10/12    Historical Provider, MD   BP 141/56  Pulse 79  Temp(Src) 98.6 F (37 C) (Oral)  Resp 22  Ht 5\' 7"  (1.702 m)  Wt 241 lb 11.2 oz (109.634 kg)  BMI 37.85  kg/m2  SpO2 99%  LMP 07/30/2010  Physical Exam  Constitutional: She is oriented to person, place, and time. She appears well-developed and well-nourished.  HENT:  Head: Normocephalic.  Neck: Normal range of motion. Neck supple.  Cardiovascular: Normal rate and regular rhythm.  Pulmonary/Chest: Effort normal and breath sounds normal. No stridor.  Abdominal: Soft. Bowel sounds are normal. There is no tenderness. There is no rebound and no guarding.  Musculoskeletal: Normal range of motion.  Neurological: She is alert and oriented to person, place, and time.  Skin: Skin is warm and dry.  Psychiatric: She has a normal mood and affect.   ED Course   Procedures (including critical care time)  Labs Review  Labs Reviewed - No data to display  Imaging Review  No results found.  EKG Interpretation  None   MDM    Final diagnoses:   None    1. Gastric food impaction related to lap band surgery, recurrent. Unable to tolerate liquids. Glucagon without success in relieving impaction. Urgent EGD. The risks, benefits, and alternatives to endoscopy with possible biopsy and possible dilation were discussed with the patient and they consent to proceed.

## 2013-10-22 NOTE — ED Notes (Signed)
Pt reports lap band surgery 2009, yesterday ate chicken and states it's stuck in her GI tract. Reports this happens usually once a year and needs GI to come use scope to push food down. Pt is a x 4. Airway intact. In NAD. Has been vomiting to try and dislodge with no results.

## 2013-10-23 ENCOUNTER — Encounter (HOSPITAL_COMMUNITY): Payer: Self-pay | Admitting: Gastroenterology

## 2013-10-24 NOTE — ED Provider Notes (Signed)
Medical screening examination/treatment/procedure(s) were performed by non-physician practitioner and as supervising physician I was immediately available for consultation/collaboration.   EKG Interpretation None        Fredia Sorrow, MD 10/24/13 1821

## 2013-12-22 ENCOUNTER — Other Ambulatory Visit (HOSPITAL_COMMUNITY): Payer: Self-pay | Admitting: Pulmonary Disease

## 2013-12-22 ENCOUNTER — Ambulatory Visit (HOSPITAL_COMMUNITY)
Admission: RE | Admit: 2013-12-22 | Discharge: 2013-12-22 | Disposition: A | Discharge status: Home / Self Care | Payer: PRIVATE HEALTH INSURANCE | Source: Ambulatory Visit | Attending: Pulmonary Disease | Admitting: Pulmonary Disease

## 2013-12-22 DIAGNOSIS — M546 Pain in thoracic spine: Secondary | ICD-10-CM

## 2013-12-22 DIAGNOSIS — M545 Low back pain, unspecified: Secondary | ICD-10-CM | POA: Insufficient documentation

## 2013-12-23 ENCOUNTER — Ambulatory Visit: Payer: PRIVATE HEALTH INSURANCE | Admitting: Obstetrics & Gynecology

## 2014-02-03 ENCOUNTER — Ambulatory Visit: Payer: PRIVATE HEALTH INSURANCE | Admitting: Obstetrics & Gynecology

## 2014-03-17 ENCOUNTER — Encounter: Payer: Self-pay | Admitting: Obstetrics & Gynecology

## 2014-03-17 ENCOUNTER — Ambulatory Visit (INDEPENDENT_AMBULATORY_CARE_PROVIDER_SITE_OTHER): Payer: PRIVATE HEALTH INSURANCE | Admitting: Obstetrics & Gynecology

## 2014-03-17 VITALS — BP 102/60 | HR 92 | Temp 98.5°F | Wt 232.0 lb

## 2014-03-17 DIAGNOSIS — Z01419 Encounter for gynecological examination (general) (routine) without abnormal findings: Secondary | ICD-10-CM

## 2014-03-17 NOTE — Progress Notes (Signed)
Subjective:     Terri [name] is a 48 y.o. female here for a routine exam.    Personal health questionnaire:  Is patient Ashkenazi Jewish, have a family history of breast and/or ovarian cancer: yes Is there a family history of uterine cancer diagnosed at age < 74, gastrointestinal cancer, urinary tract cancer, family member who is a Field seismologist syndrome-associated carrier: no Is the patient overweight and hypertensive, family history of diabetes, personal history of gestational diabetes or PCOS: yes Is patient over 69, have PCOS,  family history of premature CHD under age 19, diabetes, smoke, have hypertension or peripheral artery disease:  yes At any time, has a partner hit, kicked or otherwise hurt or frightened you?: no Over the past 2 weeks, have you felt down, depressed or hopeless?: no Over the past 2 weeks, have you felt little interest or pleasure in doing things?:no   Gynecologic History Patient's last menstrual period was 07/30/2010. Contraception: tubal ligation Last Pap results were: normal Last mammogram: 2/14. Results were: normal  Obstetric History OB History  No data available    Past Medical History  Diagnosis Date  . COPD (chronic obstructive pulmonary disease)   . Diabetes mellitus   . GERD (gastroesophageal reflux disease)   . Arthritis   . Obesity   . Asthma   . Osteoporosis   . Vertigo     Past Surgical History  Procedure Laterality Date  . Laparoscopic gastric banding    . Hernia repair    . Tubal ligation    . Bladder surgery    . Esophagogastroduodenoscopy  11/17/2011    Procedure: ESOPHAGOGASTRODUODENOSCOPY (EGD);  Surgeon: Milus Banister, MD;  Location: [place];  Service: Endoscopy;  Laterality: N/A;  . Esophagogastroduodenoscopy  01/02/2012    Procedure: ESOPHAGOGASTRODUODENOSCOPY (EGD);  Surgeon: Milus Banister, MD;  Location: [place];  Service: Endoscopy;  Laterality: N/A;  . Esophagogastroduodenoscopy N/A 01/22/2013    Procedure:  ESOPHAGOGASTRODUODENOSCOPY (EGD);  Surgeon: Jerene Bears, MD;  Location: [place];  Service: Gastroenterology;  Laterality: N/A;  . Esophagogastroduodenoscopy N/A 10/22/2013    Procedure: ESOPHAGOGASTRODUODENOSCOPY (EGD);  Surgeon: Ladene Artist, MD;  Location: [facility] ENDOSCOPY;  Service: Endoscopy;  Laterality: N/A;    Current outpatient prescriptions:albuterol (PROVENTIL HFA;VENTOLIN HFA) 108 (90 BASE) MCG/ACT inhaler, Inhale 2 puffs into the lungs every 6 (six) hours as needed for wheezing or shortness of breath., Disp: , Rfl: ;  ibuprofen (ADVIL,MOTRIN) 800 MG tablet, Take 800 mg by mouth daily as needed for moderate pain., Disp: , Rfl:  ipratropium-albuterol (DUONEB) 0.5-2.5 (3) MG/3ML SOLN, Inhale 3 mLs into the lungs every 6 (six) hours as needed. , Disp: , Rfl: ;  Liraglutide (VICTOZA) 18 MG/3ML SOPN, Inject into the skin., Disp: , Rfl: ;  glipiZIDE-metformin (METAGLIP) 5-500 MG per tablet, Take 1 tablet by mouth at bedtime. , Disp: , Rfl: ;  sertraline (ZOLOFT) 50 MG tablet, Take 50 mg by mouth daily., Disp: , Rfl:  Allergies  Allergen Reactions  . Sulfa Antibiotics Other (See Comments)    "paralyzes me"  . Cephalexin Itching and Rash  . Ciprofloxacin Hcl Itching and Rash  . Penicillins Itching and Rash    History  Substance Use Topics  . Smoking status: Former Smoker    Quit date: 11/16/2005  . Smokeless tobacco: Former Systems developer  . Alcohol Use: Yes     Comment: occasion - once or twice a year    Family History  Problem Relation Age of Onset  . Colon cancer  Neg Hx   . Breast cancer Mother   . Diabetes Mother     Sister and Father  . Cancer Mother     breast  . COPD Mother   . Heart disease Father     and Sister  . Cirrhosis Father     Non-alcholic   . Diabetes Father   . Hypertension Father   . Kidney disease Father     chronic kidney stones  . Diabetes Sister   . Diabetes Brother       Review of Systems  Constitutional: negative for fatigue and weight  loss Respiratory: negative for cough and wheezing Cardiovascular: negative for chest pain, fatigue and palpitations Gastrointestinal: negative for abdominal pain and change in bowel habits Musculoskeletal:negative for myalgias Neurological: negative for gait problems and tremors Behavioral/Psych: negative for abusive relationship, depression Endocrine: negative for temperature intolerance   Genitourinary:negative for abnormal menstrual periods, genital lesions, hot flashes, sexual problems and vaginal discharge Integument/breast: negative for breast lump, breast tenderness, nipple discharge and skin lesion(s)    Objective:       BP 102/60  Pulse 92  Temp(Src) 98.5 F (36.9 C)  Wt 105.235 kg (232 lb)  LMP 07/30/2010 General:   alert  Skin:   no rash or abnormalities  Lungs:   clear to auscultation bilaterally  Heart:   regular rate and rhythm, S1, S2 normal, no murmur, click, rub or gallop  Breasts:   normal without suspicious masses, skin or nipple changes or axillary nodes  Abdomen:  normal findings: no organomegaly, soft, non-tender and no hernia  Pelvis:  External genitalia: normal general appearance Urinary system: urethral meatus normal and bladder without fullness, nontender Vaginal: normal without tenderness, induration or masses Cervix: normal appearance Adnexa: normal bimanual exam Uterus: anteverted and non-tender, normal size   Lab Review  Labs reviewed no Radiologic studies reviewed no  Baker Janus model with increased 5 yr risk   Assessment:    Healthy female exam.   Baker Janus model suggestive of increased 5 yr risk Plan:    Education reviewed: calcium supplements, low fat, low cholesterol diet and weight bearing exercise.  [facility]  Follow up as needed.

## 2014-03-19 ENCOUNTER — Other Ambulatory Visit: Payer: Self-pay | Admitting: *Deleted

## 2014-03-19 DIAGNOSIS — Z7689 Persons encountering health services in other specified circumstances: Secondary | ICD-10-CM

## 2014-03-19 NOTE — Progress Notes (Signed)
Order for oncology referral

## 2014-03-26 ENCOUNTER — Ambulatory Visit: Payer: PRIVATE HEALTH INSURANCE | Admitting: Gynecologic Oncology

## 2014-05-31 ENCOUNTER — Encounter: Payer: Self-pay | Admitting: *Deleted

## 2014-06-01 ENCOUNTER — Encounter: Payer: Self-pay | Admitting: Obstetrics & Gynecology

## 2014-06-04 HISTORY — PX: LAPAROSCOPIC GASTRIC BAND REMOVAL WITH LAPAROSCOPIC GASTRIC SLEEVE RESECTION: SHX6498

## 2014-07-16 ENCOUNTER — Other Ambulatory Visit (HOSPITAL_COMMUNITY): Payer: Self-pay | Admitting: Pulmonary Disease

## 2014-07-16 DIAGNOSIS — Z1231 Encounter for screening mammogram for malignant neoplasm of breast: Secondary | ICD-10-CM

## 2014-08-18 ENCOUNTER — Emergency Department (HOSPITAL_COMMUNITY): Payer: Medicare Other

## 2014-08-18 ENCOUNTER — Emergency Department (HOSPITAL_COMMUNITY)
Admission: EM | Admit: 2014-08-18 | Discharge: 2014-08-18 | Disposition: A | Discharge status: Home / Self Care | Payer: Medicare Other | Attending: Emergency Medicine | Admitting: Emergency Medicine

## 2014-08-18 ENCOUNTER — Encounter (HOSPITAL_COMMUNITY): Payer: Self-pay | Admitting: *Deleted

## 2014-08-18 DIAGNOSIS — Z8739 Personal history of other diseases of the musculoskeletal system and connective tissue: Secondary | ICD-10-CM | POA: Insufficient documentation

## 2014-08-18 DIAGNOSIS — Z8719 Personal history of other diseases of the digestive system: Secondary | ICD-10-CM | POA: Insufficient documentation

## 2014-08-18 DIAGNOSIS — Z79899 Other long term (current) drug therapy: Secondary | ICD-10-CM | POA: Insufficient documentation

## 2014-08-18 DIAGNOSIS — Z880 Allergy status to penicillin: Secondary | ICD-10-CM | POA: Diagnosis not present

## 2014-08-18 DIAGNOSIS — E119 Type 2 diabetes mellitus without complications: Secondary | ICD-10-CM | POA: Insufficient documentation

## 2014-08-18 DIAGNOSIS — J441 Chronic obstructive pulmonary disease with (acute) exacerbation: Secondary | ICD-10-CM | POA: Diagnosis not present

## 2014-08-18 DIAGNOSIS — Z87891 Personal history of nicotine dependence: Secondary | ICD-10-CM | POA: Diagnosis not present

## 2014-08-18 DIAGNOSIS — E669 Obesity, unspecified: Secondary | ICD-10-CM | POA: Insufficient documentation

## 2014-08-18 DIAGNOSIS — R05 Cough: Secondary | ICD-10-CM | POA: Diagnosis present

## 2014-08-18 MED ORDER — GUAIFENESIN-CODEINE 100-10 MG/5ML PO SYRP: 5.0000 mL | ORAL_SOLUTION | Freq: Three times a day (TID) | ORAL | Status: DC | PRN

## 2014-08-18 MED ORDER — IPRATROPIUM-ALBUTEROL 0.5-2.5 (3) MG/3ML IN SOLN
3.0000 mL | Freq: Once | RESPIRATORY_TRACT | Status: AC
  Administered 2014-08-18: 3 mL via RESPIRATORY_TRACT
  Filled 2014-08-18: qty 3

## 2014-08-18 MED ORDER — PREDNISONE 10 MG PO TABS: 20.0000 mg | ORAL_TABLET | Freq: Two times a day (BID) | ORAL | Status: DC

## 2014-08-18 MED ORDER — IPRATROPIUM-ALBUTEROL 0.5-2.5 (3) MG/3ML IN SOLN
RESPIRATORY_TRACT | Status: AC
  Administered 2014-08-18: 3 mL via RESPIRATORY_TRACT
  Filled 2014-08-18: qty 3

## 2014-08-18 MED ORDER — PREDNISONE 20 MG PO TABS
40.0000 mg | ORAL_TABLET | Freq: Once | ORAL | Status: AC
  Administered 2014-08-18: 40 mg via ORAL
  Filled 2014-08-18: qty 2

## 2014-08-18 MED ORDER — HYDROCOD POLST-CHLORPHEN POLST 10-8 MG/5ML PO LQCR
5.0000 mL | Freq: Once | ORAL | Status: AC
  Administered 2014-08-18: 5 mL via ORAL
  Filled 2014-08-18: qty 5

## 2014-08-18 NOTE — ED Notes (Signed)
Patient transported to X-ray 

## 2014-08-18 NOTE — ED Provider Notes (Signed)
CSN: [id number]     Arrival date & time 08/18/14  1037 History   First MD Initiated Contact with Patient 08/18/14 1045     Chief Complaint  Patient presents with  . Cough     (Consider location/radiation/quality/duration/timing/severity/associated sxs/prior Treatment) Patient is a 49 y.o. female presenting with cough. The history is provided by the patient.  Cough Cough characteristics:  Productive Sputum characteristics:  Yellow Severity:  Moderate Onset quality:  Gradual Duration:  2 weeks Timing:  Constant Progression:  Worsening Chronicity:  New Smoker: former.   Worsened by:  Activity (smoke) Associated symptoms: ear fullness, myalgias, shortness of breath, sinus congestion and wheezing   Associated symptoms: no chills, no ear pain, no fever and no sore throat    Terri [name] is a 49 y.o. female who presents to the ED with cough and congestion. The symptoms started 2 weeks ago. She had a refill from Dr. Luan Pulling on her Z-Pak and took that and seemed to be getting better. She then went to a smoked filled area to play games and started feeling short of breath. She then found a Levaquin and took it yesterday. Today she reports feeling short of breath and every time she takes a deep breath she coughs. She has coughed until her ribs hurt. Former smoker and quit 9 years ago. Hx of COPD. She denies fever or chills.   Past Medical History  Diagnosis Date  . COPD (chronic obstructive pulmonary disease)   . Diabetes mellitus   . GERD (gastroesophageal reflux disease)   . Arthritis   . Obesity   . Asthma   . Osteoporosis   . Vertigo    Past Surgical History  Procedure Laterality Date  . Laparoscopic gastric banding    . Hernia repair    . Tubal ligation    . Bladder surgery    . Esophagogastroduodenoscopy  11/17/2011    Procedure: ESOPHAGOGASTRODUODENOSCOPY (EGD);  Surgeon: Milus Banister, MD;  Location: [place];  Service: Endoscopy;  Laterality: N/A;  .  Esophagogastroduodenoscopy  01/02/2012    Procedure: ESOPHAGOGASTRODUODENOSCOPY (EGD);  Surgeon: Milus Banister, MD;  Location: [place];  Service: Endoscopy;  Laterality: N/A;  . Esophagogastroduodenoscopy N/A 01/22/2013    Procedure: ESOPHAGOGASTRODUODENOSCOPY (EGD);  Surgeon: Jerene Bears, MD;  Location: [place];  Service: Gastroenterology;  Laterality: N/A;  . Esophagogastroduodenoscopy N/A 10/22/2013    Procedure: ESOPHAGOGASTRODUODENOSCOPY (EGD);  Surgeon: Ladene Artist, MD;  Location: [facility] ENDOSCOPY;  Service: Endoscopy;  Laterality: N/A;   Family History  Problem Relation Age of Onset  . Colon cancer Neg Hx   . Breast cancer Mother   . Diabetes Mother     Sister and Father  . Cancer Mother     breast  . COPD Mother   . Heart disease Father     and Sister  . Cirrhosis Father     Non-alcholic   . Diabetes Father   . Hypertension Father   . Kidney disease Father     chronic kidney stones  . Diabetes Sister   . Diabetes Brother    History  Substance Use Topics  . Smoking status: Former Smoker    Quit date: 11/16/2005  . Smokeless tobacco: Former Systems developer  . Alcohol Use: Yes     Comment: occasion - once or twice a year   OB History    No data available     Review of Systems  Constitutional: Negative for fever and chills.  HENT: Negative for ear  pain and sore throat.   Respiratory: Positive for cough, shortness of breath and wheezing.   Musculoskeletal: Positive for myalgias.      Allergies  Sulfa antibiotics; Cephalexin; Ciprofloxacin hcl; and Penicillins  Home Medications   Prior to Admission medications   Medication Sig Start Date End Date Taking? Authorizing Provider  albuterol (PROVENTIL HFA;VENTOLIN HFA) 108 (90 BASE) MCG/ACT inhaler Inhale 2 puffs into the lungs every 6 (six) hours as needed for wheezing or shortness of breath.   Yes Historical Provider, MD  glipiZIDE-metformin (METAGLIP) 5-500 MG per tablet Take 1 tablet by mouth at bedtime.    Yes  Historical Provider, MD  ibuprofen (ADVIL,MOTRIN) 800 MG tablet Take 800 mg by mouth daily as needed for moderate pain.   Yes Historical Provider, MD  ipratropium-albuterol (DUONEB) 0.5-2.5 (3) MG/3ML SOLN Inhale 3 mLs into the lungs every 6 (six) hours as needed.  09/10/12  Yes Historical Provider, MD  Liraglutide (VICTOZA) 18 MG/3ML SOPN Inject 1.8 mg into the skin daily.    Yes Historical Provider, MD  tiZANidine (ZANAFLEX) 2 MG tablet Take 2 mg by mouth every 6 (six) hours as needed for muscle spasms.   Yes Historical Provider, MD  guaiFENesin-codeine (ROBITUSSIN AC) 100-10 MG/5ML syrup Take 5 mLs by mouth 3 (three) times daily as needed for cough. 08/18/14   Hope Bunnie Pion, NP  predniSONE (DELTASONE) 10 MG tablet Take 2 tablets (20 mg total) by mouth 2 (two) times daily with a meal. 08/18/14   Hope Bunnie Pion, NP   BP 132/53 mmHg  Pulse 85  Temp(Src) 98.1 F (36.7 C) (Oral)  Resp 16  Ht 5\' 6"  (1.676 m)  Wt 238 lb (107.956 kg)  BMI 38.43 kg/m2  SpO2 96%  LMP 07/30/2010 Physical Exam  Constitutional: She is oriented to person, place, and time. She appears well-developed and well-nourished. No distress.  HENT:  Head: Normocephalic.  Mouth/Throat: Uvula is midline, oropharynx is clear and moist and mucous membranes are normal.  Eyes: EOM are normal.  Neck: Neck supple.  Cardiovascular: Normal rate and regular rhythm.   Pulmonary/Chest: Effort normal. She has wheezes. She has rhonchi in the right middle field.  Abdominal: Soft. There is no tenderness.  Musculoskeletal: Normal range of motion.  Neurological: She is alert and oriented to person, place, and time. No cranial nerve deficit.  Skin: Skin is warm and dry.  Psychiatric: She has a normal mood and affect. Her behavior is normal.  Nursing note and vitals reviewed.   ED Course  Procedures (including critical care time) Cxr, albuterol/atorvent neb treatment, Prednisone 40 mg PO, tussionex 5 ml PO  Labs Review Labs Reviewed - No data  to display  Imaging Review Dg Chest 2 View  08/18/2014   CLINICAL DATA:  49 year old female with cough wheezing and fever. Former smoker. Initial encounter.  EXAM: CHEST  2 VIEW  COMPARISON:  08/08/2012.  FINDINGS: Gastric banding partially visible. Lung volumes within normal limits. Normal cardiac size and mediastinal contours. Visualized tracheal air column is within normal limits. No pneumothorax, pulmonary edema, pleural effusion or confluent pulmonary opacity. No acute osseous abnormality identified.  IMPRESSION: No acute cardiopulmonary abnormality.   Electronically Signed   By: Genevie Ann M.D.   On: 08/18/2014 11:43   Discussed with Dr. Alvino Chapel.  MDM  49 y.o. female with cough, wheezing, and congestion x 2 weeks that improved with Zpak but returned after she was in a smoke filled room a few nights ago. Stable for d/c without respiratory  distress, O2 SAT 96% on R/A. Will start prednisone, Robitussin AC and have patient follow up with her PCP this week. She will return here as needed for problems.  Final diagnoses:  COPD with exacerbation      Ashley Murrain, NP 08/18/14 [place], MD 08/19/14 [phone]

## 2014-08-18 NOTE — ED Notes (Signed)
Pt states she has had a cough for 2 weeks that has produced green sputum. Pt was given a Z-pac and finished her dose on Friday. Since she has the same cough with yellow/white sputum production. NAD noted.

## 2014-08-18 NOTE — Discharge Instructions (Signed)
Take the prednisone and cough medication as directed. Call Dr. Luan Pulling' office and make your appointment for the next couple days for follow up instead of when it is currently scheduled. Return here as needed for worsening symptoms.

## 2014-08-30 ENCOUNTER — Ambulatory Visit (HOSPITAL_COMMUNITY): Payer: Medicaid Other

## 2014-10-13 ENCOUNTER — Ambulatory Visit (HOSPITAL_COMMUNITY)
Admission: RE | Admit: 2014-10-13 | Discharge: 2014-10-13 | Disposition: A | Discharge status: Home / Self Care | Payer: Medicare Other | Source: Ambulatory Visit | Attending: Pulmonary Disease | Admitting: Pulmonary Disease

## 2014-10-13 DIAGNOSIS — Z1231 Encounter for screening mammogram for malignant neoplasm of breast: Secondary | ICD-10-CM | POA: Diagnosis not present

## 2014-10-14 ENCOUNTER — Other Ambulatory Visit (HOSPITAL_COMMUNITY): Payer: Self-pay | Admitting: Pulmonary Disease

## 2014-10-14 DIAGNOSIS — N6320 Unspecified lump in the left breast, unspecified quadrant: Secondary | ICD-10-CM

## 2014-11-02 ENCOUNTER — Other Ambulatory Visit (HOSPITAL_COMMUNITY): Payer: Self-pay | Admitting: Pulmonary Disease

## 2014-11-02 ENCOUNTER — Ambulatory Visit (HOSPITAL_COMMUNITY)
Admission: RE | Admit: 2014-11-02 | Discharge: 2014-11-02 | Disposition: A | Discharge status: Home / Self Care | Payer: Medicare Other | Source: Ambulatory Visit | Attending: Pulmonary Disease | Admitting: Pulmonary Disease

## 2014-11-02 DIAGNOSIS — N63 Unspecified lump in breast: Secondary | ICD-10-CM | POA: Diagnosis not present

## 2014-11-02 DIAGNOSIS — N6320 Unspecified lump in the left breast, unspecified quadrant: Secondary | ICD-10-CM

## 2014-11-02 DIAGNOSIS — R928 Other abnormal and inconclusive findings on diagnostic imaging of breast: Secondary | ICD-10-CM | POA: Diagnosis not present

## 2014-11-17 DIAGNOSIS — Z9884 Bariatric surgery status: Secondary | ICD-10-CM | POA: Insufficient documentation

## 2014-11-17 DIAGNOSIS — J449 Chronic obstructive pulmonary disease, unspecified: Secondary | ICD-10-CM | POA: Diagnosis not present

## 2014-11-17 DIAGNOSIS — Z87891 Personal history of nicotine dependence: Secondary | ICD-10-CM | POA: Insufficient documentation

## 2014-11-17 DIAGNOSIS — R1314 Dysphagia, pharyngoesophageal phase: Secondary | ICD-10-CM | POA: Diagnosis not present

## 2014-11-17 DIAGNOSIS — E119 Type 2 diabetes mellitus without complications: Secondary | ICD-10-CM | POA: Diagnosis not present

## 2014-12-10 DIAGNOSIS — G4730 Sleep apnea, unspecified: Secondary | ICD-10-CM | POA: Diagnosis not present

## 2014-12-10 DIAGNOSIS — Z6838 Body mass index (BMI) 38.0-38.9, adult: Secondary | ICD-10-CM | POA: Diagnosis not present

## 2014-12-10 DIAGNOSIS — R1310 Dysphagia, unspecified: Secondary | ICD-10-CM | POA: Diagnosis not present

## 2014-12-10 DIAGNOSIS — Z01818 Encounter for other preprocedural examination: Secondary | ICD-10-CM | POA: Diagnosis not present

## 2014-12-28 DIAGNOSIS — K635 Polyp of colon: Secondary | ICD-10-CM | POA: Diagnosis not present

## 2014-12-28 DIAGNOSIS — R1310 Dysphagia, unspecified: Secondary | ICD-10-CM | POA: Diagnosis not present

## 2014-12-28 DIAGNOSIS — K2950 Unspecified chronic gastritis without bleeding: Secondary | ICD-10-CM | POA: Diagnosis not present

## 2014-12-28 DIAGNOSIS — D125 Benign neoplasm of sigmoid colon: Secondary | ICD-10-CM | POA: Diagnosis not present

## 2014-12-28 DIAGNOSIS — K222 Esophageal obstruction: Secondary | ICD-10-CM | POA: Diagnosis not present

## 2014-12-28 DIAGNOSIS — Z1211 Encounter for screening for malignant neoplasm of colon: Secondary | ICD-10-CM | POA: Diagnosis not present

## 2014-12-31 DIAGNOSIS — G4733 Obstructive sleep apnea (adult) (pediatric): Secondary | ICD-10-CM | POA: Diagnosis not present

## 2015-01-14 ENCOUNTER — Emergency Department (HOSPITAL_COMMUNITY)
Admission: EM | Admit: 2015-01-14 | Discharge: 2015-01-14 | Disposition: A | Discharge status: Home / Self Care | Payer: Medicare Other | Attending: Emergency Medicine | Admitting: Emergency Medicine

## 2015-01-14 ENCOUNTER — Encounter (HOSPITAL_COMMUNITY): Payer: Self-pay | Admitting: Emergency Medicine

## 2015-01-14 DIAGNOSIS — I889 Nonspecific lymphadenitis, unspecified: Secondary | ICD-10-CM | POA: Insufficient documentation

## 2015-01-14 DIAGNOSIS — M542 Cervicalgia: Secondary | ICD-10-CM | POA: Diagnosis present

## 2015-01-14 DIAGNOSIS — Z87891 Personal history of nicotine dependence: Secondary | ICD-10-CM | POA: Diagnosis not present

## 2015-01-14 DIAGNOSIS — H9202 Otalgia, left ear: Secondary | ICD-10-CM | POA: Insufficient documentation

## 2015-01-14 DIAGNOSIS — E119 Type 2 diabetes mellitus without complications: Secondary | ICD-10-CM | POA: Diagnosis not present

## 2015-01-14 DIAGNOSIS — Z880 Allergy status to penicillin: Secondary | ICD-10-CM | POA: Diagnosis not present

## 2015-01-14 DIAGNOSIS — J449 Chronic obstructive pulmonary disease, unspecified: Secondary | ICD-10-CM | POA: Diagnosis not present

## 2015-01-14 DIAGNOSIS — Z7952 Long term (current) use of systemic steroids: Secondary | ICD-10-CM | POA: Diagnosis not present

## 2015-01-14 DIAGNOSIS — E669 Obesity, unspecified: Secondary | ICD-10-CM | POA: Diagnosis not present

## 2015-01-14 DIAGNOSIS — Z79899 Other long term (current) drug therapy: Secondary | ICD-10-CM | POA: Diagnosis not present

## 2015-01-14 DIAGNOSIS — M1990 Unspecified osteoarthritis, unspecified site: Secondary | ICD-10-CM | POA: Diagnosis not present

## 2015-01-14 DIAGNOSIS — Z8719 Personal history of other diseases of the digestive system: Secondary | ICD-10-CM | POA: Insufficient documentation

## 2015-01-14 LAB — CBG MONITORING, ED: Glucose-Capillary: 221 mg/dL — ABNORMAL HIGH (ref 65–99)

## 2015-01-14 MED ORDER — CLINDAMYCIN HCL 150 MG PO CAPS: 150.0000 mg | ORAL_CAPSULE | Freq: Four times a day (QID) | ORAL | Status: DC

## 2015-01-14 NOTE — ED Notes (Signed)
Pt states that yesterday am she started with lt ear pain that radiated down into neck - Now has pain up into her head, So now entire Lt side of her head hurts

## 2015-01-14 NOTE — ED Provider Notes (Signed)
CSN: [id number]     Arrival date & time 01/14/15  1242 History   First MD Initiated Contact with Patient 01/14/15 1255     Chief Complaint  Patient presents with  . Otalgia     (Consider location/radiation/quality/duration/timing/severity/associated sxs/prior Treatment) HPI.... Patient complains of tender node on the left superior anterior lateral neck area with associated left ear pain. No fever, chills, stiff neck. Patient is diabetic. Severity is mild to moderate. She started Zithromax yesterday on her own  Past Medical History  Diagnosis Date  . COPD (chronic obstructive pulmonary disease)   . Diabetes mellitus   . GERD (gastroesophageal reflux disease)   . Arthritis   . Obesity   . Asthma   . Osteoporosis   . Vertigo    Past Surgical History  Procedure Laterality Date  . Laparoscopic gastric banding    . Hernia repair    . Tubal ligation    . Bladder surgery    . Esophagogastroduodenoscopy  11/17/2011    Procedure: ESOPHAGOGASTRODUODENOSCOPY (EGD);  Surgeon: Milus Banister, MD;  Location: [place];  Service: Endoscopy;  Laterality: N/A;  . Esophagogastroduodenoscopy  01/02/2012    Procedure: ESOPHAGOGASTRODUODENOSCOPY (EGD);  Surgeon: Milus Banister, MD;  Location: [place];  Service: Endoscopy;  Laterality: N/A;  . Esophagogastroduodenoscopy N/A 01/22/2013    Procedure: ESOPHAGOGASTRODUODENOSCOPY (EGD);  Surgeon: Jerene Bears, MD;  Location: [place];  Service: Gastroenterology;  Laterality: N/A;  . Esophagogastroduodenoscopy N/A 10/22/2013    Procedure: ESOPHAGOGASTRODUODENOSCOPY (EGD);  Surgeon: Ladene Artist, MD;  Location: [facility] ENDOSCOPY;  Service: Endoscopy;  Laterality: N/A;   Family History  Problem Relation Age of Onset  . Colon cancer Neg Hx   . Breast cancer Mother   . Diabetes Mother     Sister and Father  . Cancer Mother     breast  . COPD Mother   . Heart disease Father     and Sister  . Cirrhosis Father     Non-alcholic   . Diabetes  Father   . Hypertension Father   . Kidney disease Father     chronic kidney stones  . Diabetes Sister   . Diabetes Brother    Social History  Substance Use Topics  . Smoking status: Former Smoker    Quit date: 11/16/2005  . Smokeless tobacco: Never Used  . Alcohol Use: Yes     Comment: occasion - once or twice a year   OB History    No data available     Review of Systems    Allergies  Sulfa antibiotics; Cephalexin; Ciprofloxacin hcl; and Penicillins  Home Medications   Prior to Admission medications   Medication Sig Start Date End Date Taking? Authorizing Provider  albuterol (PROVENTIL HFA;VENTOLIN HFA) 108 (90 BASE) MCG/ACT inhaler Inhale 2 puffs into the lungs every 6 (six) hours as needed for wheezing or shortness of breath.    Historical Provider, MD  clindamycin (CLEOCIN) 150 MG capsule Take 1 capsule (150 mg total) by mouth every 6 (six) hours. 01/14/15   Nat Christen, MD  glipiZIDE-metformin (METAGLIP) 5-500 MG per tablet Take 1 tablet by mouth at bedtime.     Historical Provider, MD  guaiFENesin-codeine (ROBITUSSIN AC) 100-10 MG/5ML syrup Take 5 mLs by mouth 3 (three) times daily as needed for cough. 08/18/14   Hope Bunnie Pion, NP  ibuprofen (ADVIL,MOTRIN) 800 MG tablet Take 800 mg by mouth daily as needed for moderate pain.    Historical Provider, MD  ipratropium-albuterol (DUONEB) 0.5-2.5 (  3) MG/3ML SOLN Inhale 3 mLs into the lungs every 6 (six) hours as needed.  09/10/12   Historical Provider, MD  Liraglutide (VICTOZA) 18 MG/3ML SOPN Inject 1.8 mg into the skin daily.     Historical Provider, MD  predniSONE (DELTASONE) 10 MG tablet Take 2 tablets (20 mg total) by mouth 2 (two) times daily with a meal. 08/18/14   Hope Bunnie Pion, NP  tiZANidine (ZANAFLEX) 2 MG tablet Take 2 mg by mouth every 6 (six) hours as needed for muscle spasms.    Historical Provider, MD   BP 132/63 mmHg  Pulse 72  Temp(Src) 97.8 F (36.6 C) (Oral)  Resp 20  Ht 5\' 6"  (1.676 m)  Wt 240 lb (108.863 kg)   BMI 38.76 kg/m2  SpO2 98%  LMP 07/30/2010 Physical Exam  Constitutional: She is oriented to person, place, and time. She appears well-developed and well-nourished.  HENT:  Head: Normocephalic and atraumatic.  Bilateral tympanic membranes are clear  Eyes: Conjunctivae and EOM are normal. Pupils are equal, round, and reactive to light.  Neck:  Tender node in the superior anterior lateral aspect of the neck approximately 1.5 cm in diameter  Cardiovascular: Normal rate and regular rhythm.   Pulmonary/Chest: Effort normal and breath sounds normal.  Abdominal: Soft. Bowel sounds are normal.  Musculoskeletal: Normal range of motion.  Neurological: She is alert and oriented to person, place, and time.  Skin: Skin is warm and dry.  Psychiatric: She has a normal mood and affect. Her behavior is normal.  Nursing note and vitals reviewed.   ED Course  Procedures (including critical care time) Labs Review Labs Reviewed  CBG MONITORING, ED - Abnormal; Notable for the following:    Glucose-Capillary 221 (*)    All other components within normal limits    Imaging Review No results found. I, Jazmynn Pho, personally reviewed and evaluated these images and lab results as part of my medical decision-making.   EKG Interpretation None      MDM   Final diagnoses:  Adenitis    Patient is diabetic. Will start clindamycin for potential infection. Advised to return if worse    Nat Christen, MD 01/14/15 1418

## 2015-01-14 NOTE — Discharge Instructions (Signed)
Cervical Adenitis You have a swollen lymph gland in your neck. This commonly happens with Strep and virus infections, dental problems, insect bites, and injuries about the face, scalp, or neck. The lymph glands swell as the body fights the infection or heals the injury. Swelling and firmness typically lasts for several weeks after the infection or injury is healed. Rarely lymph glands can become swollen because of cancer or TB. Antibiotics are prescribed if there is evidence of an infection. Sometimes an infected lymph gland becomes filled with pus. This condition may require opening up the abscessed gland by draining it surgically. Most of the time infected glands return to normal within two weeks. Do not poke or squeeze the swollen lymph nodes. That may keep them from shrinking back to their normal size. If the lymph gland is still swollen after 2 weeks, further medical evaluation is needed.  SEEK IMMEDIATE MEDICAL CARE IF:  You have difficulty swallowing or breathing, increased swelling, severe pain, or a high fever.  Document Released: 05/21/2005 Document Revised: 08/13/2011 Document Reviewed: 11/10/2006 [facility] Patient Information 2015 [place], [place]. This information is not intended to replace advice given to you by your health care provider. Make sure you discuss any questions you have with your health care provider.  Start new antibiotic. Prescription given. Follow-up your primary care doctor.

## 2015-01-21 DIAGNOSIS — E119 Type 2 diabetes mellitus without complications: Secondary | ICD-10-CM | POA: Diagnosis not present

## 2015-02-03 DIAGNOSIS — M4712 Other spondylosis with myelopathy, cervical region: Secondary | ICD-10-CM | POA: Diagnosis not present

## 2015-02-03 DIAGNOSIS — R0789 Other chest pain: Secondary | ICD-10-CM | POA: Diagnosis not present

## 2015-02-03 DIAGNOSIS — E119 Type 2 diabetes mellitus without complications: Secondary | ICD-10-CM | POA: Diagnosis not present

## 2015-02-03 DIAGNOSIS — J449 Chronic obstructive pulmonary disease, unspecified: Secondary | ICD-10-CM | POA: Diagnosis not present

## 2015-02-09 DIAGNOSIS — G4733 Obstructive sleep apnea (adult) (pediatric): Secondary | ICD-10-CM | POA: Diagnosis not present

## 2015-02-11 DIAGNOSIS — E118 Type 2 diabetes mellitus with unspecified complications: Secondary | ICD-10-CM | POA: Diagnosis not present

## 2015-02-11 DIAGNOSIS — Z9884 Bariatric surgery status: Secondary | ICD-10-CM | POA: Diagnosis not present

## 2015-02-11 DIAGNOSIS — Z87891 Personal history of nicotine dependence: Secondary | ICD-10-CM | POA: Diagnosis not present

## 2015-02-16 DIAGNOSIS — G4733 Obstructive sleep apnea (adult) (pediatric): Secondary | ICD-10-CM | POA: Diagnosis not present

## 2015-02-16 DIAGNOSIS — Z6838 Body mass index (BMI) 38.0-38.9, adult: Secondary | ICD-10-CM | POA: Diagnosis not present

## 2015-02-18 ENCOUNTER — Other Ambulatory Visit (HOSPITAL_COMMUNITY): Payer: Self-pay | Admitting: Pulmonary Disease

## 2015-02-18 DIAGNOSIS — M542 Cervicalgia: Secondary | ICD-10-CM

## 2015-03-02 ENCOUNTER — Other Ambulatory Visit (HOSPITAL_COMMUNITY): Payer: Medicare Other

## 2015-03-14 DIAGNOSIS — G4733 Obstructive sleep apnea (adult) (pediatric): Secondary | ICD-10-CM | POA: Diagnosis not present

## 2015-03-15 DIAGNOSIS — E119 Type 2 diabetes mellitus without complications: Secondary | ICD-10-CM | POA: Diagnosis not present

## 2015-03-15 DIAGNOSIS — F17200 Nicotine dependence, unspecified, uncomplicated: Secondary | ICD-10-CM | POA: Diagnosis not present

## 2015-03-15 DIAGNOSIS — Z720 Tobacco use: Secondary | ICD-10-CM | POA: Diagnosis not present

## 2015-03-15 DIAGNOSIS — Z01818 Encounter for other preprocedural examination: Secondary | ICD-10-CM | POA: Diagnosis not present

## 2015-03-17 ENCOUNTER — Ambulatory Visit (HOSPITAL_COMMUNITY)
Admission: RE | Admit: 2015-03-17 | Discharge: 2015-03-17 | Disposition: A | Discharge status: Home / Self Care | Payer: Medicare Other | Source: Ambulatory Visit | Attending: Pulmonary Disease | Admitting: Pulmonary Disease

## 2015-03-17 DIAGNOSIS — M542 Cervicalgia: Secondary | ICD-10-CM | POA: Insufficient documentation

## 2015-03-17 DIAGNOSIS — M50222 Other cervical disc displacement at C5-C6 level: Secondary | ICD-10-CM | POA: Insufficient documentation

## 2015-03-23 DIAGNOSIS — Z23 Encounter for immunization: Secondary | ICD-10-CM | POA: Diagnosis not present

## 2015-03-23 DIAGNOSIS — Z0000 Encounter for general adult medical examination without abnormal findings: Secondary | ICD-10-CM | POA: Diagnosis not present

## 2015-03-28 ENCOUNTER — Other Ambulatory Visit (HOSPITAL_COMMUNITY): Payer: Self-pay | Admitting: Pulmonary Disease

## 2015-03-28 DIAGNOSIS — N6320 Unspecified lump in the left breast, unspecified quadrant: Secondary | ICD-10-CM

## 2015-03-28 DIAGNOSIS — R160 Hepatomegaly, not elsewhere classified: Secondary | ICD-10-CM | POA: Diagnosis not present

## 2015-03-28 DIAGNOSIS — K760 Fatty (change of) liver, not elsewhere classified: Secondary | ICD-10-CM | POA: Diagnosis not present

## 2015-03-28 DIAGNOSIS — R7989 Other specified abnormal findings of blood chemistry: Secondary | ICD-10-CM | POA: Diagnosis not present

## 2015-04-05 DIAGNOSIS — E118 Type 2 diabetes mellitus with unspecified complications: Secondary | ICD-10-CM | POA: Diagnosis not present

## 2015-04-05 DIAGNOSIS — Z9884 Bariatric surgery status: Secondary | ICD-10-CM | POA: Diagnosis not present

## 2015-04-12 ENCOUNTER — Encounter (HOSPITAL_COMMUNITY): Payer: Medicare Other

## 2015-04-14 DIAGNOSIS — G4733 Obstructive sleep apnea (adult) (pediatric): Secondary | ICD-10-CM | POA: Diagnosis not present

## 2015-04-19 ENCOUNTER — Ambulatory Visit (HOSPITAL_COMMUNITY)
Admission: RE | Admit: 2015-04-19 | Discharge: 2015-04-19 | Disposition: A | Discharge status: Home / Self Care | Payer: Medicare Other | Source: Ambulatory Visit | Attending: Pulmonary Disease | Admitting: Pulmonary Disease

## 2015-04-19 DIAGNOSIS — N6320 Unspecified lump in the left breast, unspecified quadrant: Secondary | ICD-10-CM

## 2015-04-19 DIAGNOSIS — Z6837 Body mass index (BMI) 37.0-37.9, adult: Secondary | ICD-10-CM | POA: Diagnosis not present

## 2015-04-19 DIAGNOSIS — Z9884 Bariatric surgery status: Secondary | ICD-10-CM | POA: Diagnosis not present

## 2015-04-19 DIAGNOSIS — R1314 Dysphagia, pharyngoesophageal phase: Secondary | ICD-10-CM | POA: Diagnosis not present

## 2015-04-19 DIAGNOSIS — N63 Unspecified lump in breast: Secondary | ICD-10-CM | POA: Diagnosis not present

## 2015-04-19 DIAGNOSIS — Z4651 Encounter for fitting and adjustment of gastric lap band: Secondary | ICD-10-CM | POA: Diagnosis not present

## 2015-04-21 DIAGNOSIS — Z87891 Personal history of nicotine dependence: Secondary | ICD-10-CM | POA: Diagnosis not present

## 2015-04-21 DIAGNOSIS — E559 Vitamin D deficiency, unspecified: Secondary | ICD-10-CM | POA: Insufficient documentation

## 2015-04-21 DIAGNOSIS — E119 Type 2 diabetes mellitus without complications: Secondary | ICD-10-CM | POA: Diagnosis not present

## 2015-04-21 DIAGNOSIS — Z9884 Bariatric surgery status: Secondary | ICD-10-CM | POA: Diagnosis not present

## 2015-04-21 DIAGNOSIS — E782 Mixed hyperlipidemia: Secondary | ICD-10-CM | POA: Insufficient documentation

## 2015-05-03 DIAGNOSIS — G4733 Obstructive sleep apnea (adult) (pediatric): Secondary | ICD-10-CM | POA: Diagnosis not present

## 2015-05-03 DIAGNOSIS — Z01818 Encounter for other preprocedural examination: Secondary | ICD-10-CM | POA: Diagnosis not present

## 2015-05-04 DIAGNOSIS — Z6837 Body mass index (BMI) 37.0-37.9, adult: Secondary | ICD-10-CM | POA: Diagnosis not present

## 2015-05-04 DIAGNOSIS — G4733 Obstructive sleep apnea (adult) (pediatric): Secondary | ICD-10-CM | POA: Diagnosis not present

## 2015-05-05 ENCOUNTER — Other Ambulatory Visit (HOSPITAL_COMMUNITY): Payer: Self-pay | Admitting: Pulmonary Disease

## 2015-05-05 ENCOUNTER — Ambulatory Visit (HOSPITAL_COMMUNITY)
Admission: RE | Admit: 2015-05-05 | Discharge: 2015-05-05 | Disposition: A | Discharge status: Home / Self Care | Payer: Medicare Other | Source: Ambulatory Visit | Attending: Pulmonary Disease | Admitting: Pulmonary Disease

## 2015-05-05 DIAGNOSIS — M25559 Pain in unspecified hip: Secondary | ICD-10-CM | POA: Diagnosis not present

## 2015-05-05 DIAGNOSIS — M545 Low back pain: Secondary | ICD-10-CM | POA: Diagnosis not present

## 2015-05-05 DIAGNOSIS — R102 Pelvic and perineal pain: Secondary | ICD-10-CM | POA: Diagnosis not present

## 2015-05-05 DIAGNOSIS — M47816 Spondylosis without myelopathy or radiculopathy, lumbar region: Secondary | ICD-10-CM | POA: Diagnosis not present

## 2015-05-05 DIAGNOSIS — R52 Pain, unspecified: Secondary | ICD-10-CM

## 2015-05-05 DIAGNOSIS — M47896 Other spondylosis, lumbar region: Secondary | ICD-10-CM | POA: Insufficient documentation

## 2015-05-05 DIAGNOSIS — Z6837 Body mass index (BMI) 37.0-37.9, adult: Secondary | ICD-10-CM | POA: Diagnosis not present

## 2015-05-09 DIAGNOSIS — E119 Type 2 diabetes mellitus without complications: Secondary | ICD-10-CM | POA: Diagnosis not present

## 2015-05-09 DIAGNOSIS — Z87891 Personal history of nicotine dependence: Secondary | ICD-10-CM | POA: Diagnosis not present

## 2015-05-09 DIAGNOSIS — E782 Mixed hyperlipidemia: Secondary | ICD-10-CM | POA: Diagnosis not present

## 2015-05-09 DIAGNOSIS — Z882 Allergy status to sulfonamides status: Secondary | ICD-10-CM | POA: Diagnosis not present

## 2015-05-09 DIAGNOSIS — J449 Chronic obstructive pulmonary disease, unspecified: Secondary | ICD-10-CM | POA: Diagnosis not present

## 2015-05-09 DIAGNOSIS — R1310 Dysphagia, unspecified: Secondary | ICD-10-CM | POA: Diagnosis not present

## 2015-05-09 DIAGNOSIS — Z880 Allergy status to penicillin: Secondary | ICD-10-CM | POA: Diagnosis not present

## 2015-05-09 DIAGNOSIS — E559 Vitamin D deficiency, unspecified: Secondary | ICD-10-CM | POA: Diagnosis not present

## 2015-05-09 DIAGNOSIS — E8881 Metabolic syndrome: Secondary | ICD-10-CM | POA: Diagnosis not present

## 2015-05-09 DIAGNOSIS — Z79899 Other long term (current) drug therapy: Secondary | ICD-10-CM | POA: Diagnosis not present

## 2015-05-09 DIAGNOSIS — F329 Major depressive disorder, single episode, unspecified: Secondary | ICD-10-CM | POA: Diagnosis not present

## 2015-05-09 DIAGNOSIS — K219 Gastro-esophageal reflux disease without esophagitis: Secondary | ICD-10-CM | POA: Diagnosis not present

## 2015-05-09 DIAGNOSIS — Z6837 Body mass index (BMI) 37.0-37.9, adult: Secondary | ICD-10-CM | POA: Diagnosis not present

## 2015-05-09 DIAGNOSIS — Z888 Allergy status to other drugs, medicaments and biological substances status: Secondary | ICD-10-CM | POA: Diagnosis not present

## 2015-05-09 DIAGNOSIS — Z883 Allergy status to other anti-infective agents status: Secondary | ICD-10-CM | POA: Diagnosis not present

## 2015-05-09 DIAGNOSIS — Z4651 Encounter for fitting and adjustment of gastric lap band: Secondary | ICD-10-CM | POA: Diagnosis not present

## 2015-05-09 DIAGNOSIS — F419 Anxiety disorder, unspecified: Secondary | ICD-10-CM | POA: Diagnosis not present

## 2015-05-14 DIAGNOSIS — G4733 Obstructive sleep apnea (adult) (pediatric): Secondary | ICD-10-CM | POA: Diagnosis not present

## 2015-05-18 DIAGNOSIS — M79662 Pain in left lower leg: Secondary | ICD-10-CM | POA: Insufficient documentation

## 2015-05-18 DIAGNOSIS — R0602 Shortness of breath: Secondary | ICD-10-CM | POA: Diagnosis not present

## 2015-05-18 DIAGNOSIS — J9811 Atelectasis: Secondary | ICD-10-CM | POA: Diagnosis not present

## 2015-06-23 DIAGNOSIS — K5900 Constipation, unspecified: Secondary | ICD-10-CM | POA: Insufficient documentation

## 2015-07-01 DIAGNOSIS — E119 Type 2 diabetes mellitus without complications: Secondary | ICD-10-CM | POA: Diagnosis not present

## 2015-09-13 DIAGNOSIS — J449 Chronic obstructive pulmonary disease, unspecified: Secondary | ICD-10-CM | POA: Diagnosis not present

## 2015-09-13 DIAGNOSIS — K219 Gastro-esophageal reflux disease without esophagitis: Secondary | ICD-10-CM | POA: Diagnosis not present

## 2015-09-13 DIAGNOSIS — E119 Type 2 diabetes mellitus without complications: Secondary | ICD-10-CM | POA: Diagnosis not present

## 2015-10-27 DIAGNOSIS — B372 Candidiasis of skin and nail: Secondary | ICD-10-CM | POA: Diagnosis not present

## 2015-10-27 DIAGNOSIS — K912 Postsurgical malabsorption, not elsewhere classified: Secondary | ICD-10-CM | POA: Diagnosis not present

## 2015-11-02 ENCOUNTER — Other Ambulatory Visit: Payer: Self-pay

## 2015-11-02 DIAGNOSIS — Z1231 Encounter for screening mammogram for malignant neoplasm of breast: Secondary | ICD-10-CM

## 2015-11-09 DIAGNOSIS — K5900 Constipation, unspecified: Secondary | ICD-10-CM | POA: Diagnosis not present

## 2015-11-09 DIAGNOSIS — Z9884 Bariatric surgery status: Secondary | ICD-10-CM | POA: Diagnosis not present

## 2015-11-09 DIAGNOSIS — J449 Chronic obstructive pulmonary disease, unspecified: Secondary | ICD-10-CM | POA: Diagnosis not present

## 2015-11-09 DIAGNOSIS — R350 Frequency of micturition: Secondary | ICD-10-CM | POA: Diagnosis not present

## 2015-11-09 DIAGNOSIS — E559 Vitamin D deficiency, unspecified: Secondary | ICD-10-CM | POA: Diagnosis not present

## 2015-11-09 DIAGNOSIS — Z87891 Personal history of nicotine dependence: Secondary | ICD-10-CM | POA: Diagnosis not present

## 2015-11-09 DIAGNOSIS — Z01411 Encounter for gynecological examination (general) (routine) with abnormal findings: Secondary | ICD-10-CM | POA: Diagnosis not present

## 2015-11-09 DIAGNOSIS — Z01419 Encounter for gynecological examination (general) (routine) without abnormal findings: Secondary | ICD-10-CM | POA: Diagnosis not present

## 2015-11-11 ENCOUNTER — Ambulatory Visit: Payer: Medicare Other

## 2015-11-11 ENCOUNTER — Ambulatory Visit
Admission: RE | Admit: 2015-11-11 | Discharge: 2015-11-11 | Disposition: A | Discharge status: Home / Self Care | Payer: Medicare Other | Source: Ambulatory Visit

## 2015-11-11 ENCOUNTER — Other Ambulatory Visit: Payer: Self-pay | Admitting: Pulmonary Disease

## 2015-11-11 DIAGNOSIS — Z1231 Encounter for screening mammogram for malignant neoplasm of breast: Secondary | ICD-10-CM

## 2015-12-14 DIAGNOSIS — K219 Gastro-esophageal reflux disease without esophagitis: Secondary | ICD-10-CM | POA: Diagnosis not present

## 2015-12-14 DIAGNOSIS — Z9884 Bariatric surgery status: Secondary | ICD-10-CM | POA: Diagnosis not present

## 2015-12-14 DIAGNOSIS — J449 Chronic obstructive pulmonary disease, unspecified: Secondary | ICD-10-CM | POA: Diagnosis not present

## 2015-12-14 DIAGNOSIS — M158 Other polyosteoarthritis: Secondary | ICD-10-CM | POA: Diagnosis not present

## 2016-01-02 DIAGNOSIS — M25511 Pain in right shoulder: Secondary | ICD-10-CM | POA: Insufficient documentation

## 2016-06-08 ENCOUNTER — Emergency Department (HOSPITAL_COMMUNITY)
Admission: EM | Admit: 2016-06-08 | Discharge: 2016-06-08 | Disposition: A | Discharge status: Home / Self Care | Payer: Medicare Other | Attending: Emergency Medicine | Admitting: Emergency Medicine

## 2016-06-08 ENCOUNTER — Encounter (HOSPITAL_COMMUNITY): Payer: Self-pay | Admitting: Emergency Medicine

## 2016-06-08 ENCOUNTER — Emergency Department (HOSPITAL_COMMUNITY): Payer: Medicare Other

## 2016-06-08 DIAGNOSIS — R0602 Shortness of breath: Secondary | ICD-10-CM | POA: Diagnosis not present

## 2016-06-08 DIAGNOSIS — Z7984 Long term (current) use of oral hypoglycemic drugs: Secondary | ICD-10-CM | POA: Diagnosis not present

## 2016-06-08 DIAGNOSIS — E119 Type 2 diabetes mellitus without complications: Secondary | ICD-10-CM | POA: Insufficient documentation

## 2016-06-08 DIAGNOSIS — J45909 Unspecified asthma, uncomplicated: Secondary | ICD-10-CM | POA: Diagnosis not present

## 2016-06-08 DIAGNOSIS — Z87891 Personal history of nicotine dependence: Secondary | ICD-10-CM | POA: Diagnosis not present

## 2016-06-08 DIAGNOSIS — J441 Chronic obstructive pulmonary disease with (acute) exacerbation: Secondary | ICD-10-CM | POA: Insufficient documentation

## 2016-06-08 DIAGNOSIS — R05 Cough: Secondary | ICD-10-CM | POA: Diagnosis not present

## 2016-06-08 DIAGNOSIS — J069 Acute upper respiratory infection, unspecified: Secondary | ICD-10-CM | POA: Diagnosis not present

## 2016-06-08 DIAGNOSIS — R079 Chest pain, unspecified: Secondary | ICD-10-CM | POA: Diagnosis not present

## 2016-06-08 DIAGNOSIS — Z79899 Other long term (current) drug therapy: Secondary | ICD-10-CM | POA: Diagnosis not present

## 2016-06-08 MED ORDER — BENZONATATE 100 MG PO CAPS: 100.0000 mg | ORAL_CAPSULE | Freq: Three times a day (TID) | ORAL | 0 refills | Status: DC

## 2016-06-08 MED ORDER — PREDNISONE 20 MG PO TABS
40.0000 mg | ORAL_TABLET | Freq: Once | ORAL | Status: AC
  Administered 2016-06-08: 40 mg via ORAL
  Filled 2016-06-08: qty 2

## 2016-06-08 MED ORDER — ALBUTEROL SULFATE HFA 108 (90 BASE) MCG/ACT IN AERS
2.0000 | INHALATION_SPRAY | Freq: Once | RESPIRATORY_TRACT | Status: AC
  Administered 2016-06-08: 2 via RESPIRATORY_TRACT
  Filled 2016-06-08: qty 6.7

## 2016-06-08 MED ORDER — PREDNISONE 20 MG PO TABS: ORAL_TABLET | ORAL | 0 refills | Status: DC

## 2016-06-08 MED ORDER — BENZONATATE 100 MG PO CAPS
100.0000 mg | ORAL_CAPSULE | Freq: Once | ORAL | Status: AC
  Administered 2016-06-08: 100 mg via ORAL
  Filled 2016-06-08: qty 1

## 2016-06-08 NOTE — ED Provider Notes (Signed)
[facility] DEPT Provider Note   CSN: [id number] Arrival date & time: 06/08/16  [id number]     History   Chief Complaint Chief Complaint  Patient presents with  . Cough  . Nausea    HPI Terri [name] is a 51 y.o. female.  51 yo F w/ 6 days of coughing. Has tried duonebs and azithromycin without relief. Vomited after coughing a couple times. Had a fever at night. No other GI symptoms. No body aches. Does have right ear ache. Some drainage in throat that 'tickles' increasing cough.  No other modifying or associated factors.  H/O same.       Past Medical History:  Diagnosis Date  . Arthritis   . Asthma   . COPD (chronic obstructive pulmonary disease) ([place])   . Diabetes mellitus   . GERD (gastroesophageal reflux disease)   . Obesity   . Osteoporosis   . Vertigo     Patient Active Problem List   Diagnosis Date Noted  . Chest pain 08/19/2012  . COPD exacerbation ([place]) 08/19/2012  . Diabetes ([place]) 08/19/2012  . Lapband in [facility] 2009 02/13/2012  . Nonspecific (abnormal) findings on radiological and other examination of gastrointestinal tract 01/02/2012  . Foreign body in stomach 01/02/2012  . Dysphagia 11/17/2011    Past Surgical History:  Procedure Laterality Date  . BLADDER SURGERY    . ESOPHAGOGASTRODUODENOSCOPY  11/17/2011   Procedure: ESOPHAGOGASTRODUODENOSCOPY (EGD);  Surgeon: Milus Banister, MD;  Location: [place];  Service: Endoscopy;  Laterality: N/A;  . ESOPHAGOGASTRODUODENOSCOPY  01/02/2012   Procedure: ESOPHAGOGASTRODUODENOSCOPY (EGD);  Surgeon: Milus Banister, MD;  Location: [place];  Service: Endoscopy;  Laterality: N/A;  . ESOPHAGOGASTRODUODENOSCOPY N/A 01/22/2013   Procedure: ESOPHAGOGASTRODUODENOSCOPY (EGD);  Surgeon: Jerene Bears, MD;  Location: [place];  Service: Gastroenterology;  Laterality: N/A;  . ESOPHAGOGASTRODUODENOSCOPY N/A 10/22/2013   Procedure: ESOPHAGOGASTRODUODENOSCOPY (EGD);  Surgeon: Ladene Artist, MD;  Location: [facility]  ENDOSCOPY;  Service: Endoscopy;  Laterality: N/A;  . HERNIA REPAIR    . LAPAROSCOPIC GASTRIC BANDING    . TUBAL LIGATION      OB History    No data available       Home Medications    Prior to Admission medications   Medication Sig Start Date End Date Taking? Authorizing Provider  albuterol (PROVENTIL HFA;VENTOLIN HFA) 108 (90 BASE) MCG/ACT inhaler Inhale 2 puffs into the lungs every 6 (six) hours as needed for wheezing or shortness of breath.    Historical Provider, MD  benzonatate (TESSALON) 100 MG capsule Take 1 capsule (100 mg total) by mouth every 8 (eight) hours. 06/08/16   Merrily Pew, MD  clindamycin (CLEOCIN) 150 MG capsule Take 1 capsule (150 mg total) by mouth every 6 (six) hours. 01/14/15   Nat Christen, MD  glipiZIDE-metformin (METAGLIP) 5-500 MG per tablet Take 1 tablet by mouth at bedtime.     Historical Provider, MD  guaiFENesin-codeine (ROBITUSSIN AC) 100-10 MG/5ML syrup Take 5 mLs by mouth 3 (three) times daily as needed for cough. 08/18/14   Hope Bunnie Pion, NP  ibuprofen (ADVIL,MOTRIN) 800 MG tablet Take 800 mg by mouth daily as needed for moderate pain.    Historical Provider, MD  ipratropium-albuterol (DUONEB) 0.5-2.5 (3) MG/3ML SOLN Inhale 3 mLs into the lungs every 6 (six) hours as needed.  09/10/12   Historical Provider, MD  Liraglutide (VICTOZA) 18 MG/3ML SOPN Inject 1.8 mg into the skin daily.     Historical Provider, MD  predniSONE (DELTASONE) 20 MG tablet  2 tabs po daily x 4 days 06/09/16   Merrily Pew, MD  tiZANidine (ZANAFLEX) 2 MG tablet Take 2 mg by mouth every 6 (six) hours as needed for muscle spasms.    Historical Provider, MD    Family History Family History  Problem Relation Age of Onset  . Breast cancer Mother   . Diabetes Mother     Sister and Father  . Cancer Mother     breast  . COPD Mother   . Heart disease Father     and Sister  . Cirrhosis Father     Non-alcholic   . Diabetes Father   . Hypertension Father   . Kidney disease Father      chronic kidney stones  . Diabetes Sister   . Diabetes Brother   . Colon cancer Neg Hx     Social History Social History  Substance Use Topics  . Smoking status: Former Smoker    Quit date: 11/16/2005  . Smokeless tobacco: Never Used  . Alcohol use Yes     Comment: occasion - once or twice a year     Allergies   Sulfa antibiotics; Cephalexin; Ciprofloxacin hcl; and Penicillins   Review of Systems Review of Systems  All other systems reviewed and are negative.    Physical Exam Updated Vital Signs BP 136/84   Pulse 91   Temp 98.5 F (36.9 C) (Oral)   Resp 18   Ht 5\' 6"  (1.676 m)   Wt 220 lb (99.8 kg)   LMP 07/30/2010   SpO2 94%   BMI 35.51 kg/m   Physical Exam  Constitutional: She is oriented to person, place, and time. She appears well-developed and well-nourished.  HENT:  Head: Normocephalic and atraumatic.  Eyes: Conjunctivae and EOM are normal.  Neck: Normal range of motion.  Cardiovascular: Normal rate and regular rhythm.   Pulmonary/Chest: No stridor. No respiratory distress. She has wheezes (lUL).  Abdominal: She exhibits no distension.  Musculoskeletal: Normal range of motion. She exhibits no edema or deformity.  Neurological: She is alert and oriented to person, place, and time. No cranial nerve deficit.  Nursing note and vitals reviewed.    ED Treatments / Results  Labs (all labs ordered are listed, but only abnormal results are displayed) Labs Reviewed - No data to display  EKG  EKG Interpretation None       Radiology Dg Chest 2 View  Result Date: 06/08/2016 CLINICAL DATA:  Productive cough and shortness of breath. Wheezing. Chest pain. EXAM: CHEST  2 VIEW COMPARISON:  Two-view chest x-ray 08/18/2014 FINDINGS: The heart size is normal. Aortic atherosclerotic cysts is present. Mild pulmonary vascular congestion is present without frank edema or effusions. No focal airspace consolidation is present. IMPRESSION: 1. Mild pulmonary vascular  congestion without frank edema. 2. Aortic atherosclerosis. Electronically Signed   By: San Morelle M.D.   On: 06/08/2016 08:35    Procedures Procedures (including critical care time)  Medications Ordered in ED Medications  predniSONE (DELTASONE) tablet 40 mg (40 mg Oral Given 06/08/16 0752)  albuterol (PROVENTIL HFA;VENTOLIN HFA) 108 (90 Base) MCG/ACT inhaler 2 puff (2 puffs Inhalation Given 06/08/16 0841)  benzonatate (TESSALON) capsule 100 mg (100 mg Oral Given 06/08/16 0752)     Initial Impression / Assessment and Plan / ED Course  I have reviewed the triage vital signs and the nursing notes.  Pertinent labs & imaging results that were available during my care of the patient were reviewed by me and considered  in my medical decision making (see chart for details).  Clinical Course    Likely viral URI. Will check cxr with asymmetric wheezing. Symptomatic treatment. COPD v URI. cxr with possible edema but no CP, h/o HF to suggest CHF exacerbation.  Already taken azithromycin, I don't see a need for more antibiotics at this time. Will give rx for prednisone/tessalon with [place] FU in 3 days if not improving.   Final Clinical Impressions(s) / ED Diagnoses   Final diagnoses:  Upper respiratory tract infection, unspecified type  Chronic obstructive pulmonary disease with acute exacerbation (HCC)    New Prescriptions New Prescriptions   BENZONATATE (TESSALON) 100 MG CAPSULE    Take 1 capsule (100 mg total) by mouth every 8 (eight) hours.   PREDNISONE (DELTASONE) 20 MG TABLET    2 tabs po daily x 4 days     Merrily Pew, MD 06/08/16 [phone]

## 2016-06-08 NOTE — ED Triage Notes (Signed)
Patient states cough and congestion that started 5 days ago. Sates fever at night. Denies vomiting/diarrhea. States some nausea last night after taking Nyquil.

## 2016-06-12 DIAGNOSIS — Z9884 Bariatric surgery status: Secondary | ICD-10-CM | POA: Diagnosis not present

## 2016-06-12 DIAGNOSIS — K219 Gastro-esophageal reflux disease without esophagitis: Secondary | ICD-10-CM | POA: Diagnosis not present

## 2016-06-12 DIAGNOSIS — J441 Chronic obstructive pulmonary disease with (acute) exacerbation: Secondary | ICD-10-CM | POA: Diagnosis not present

## 2016-07-25 ENCOUNTER — Encounter: Payer: Self-pay | Admitting: Orthopaedic Surgery

## 2016-07-25 ENCOUNTER — Ambulatory Visit (HOSPITAL_COMMUNITY)
Admission: RE | Admit: 2016-07-25 | Discharge: 2016-07-25 | Disposition: A | Discharge status: Home / Self Care | Payer: Medicare Other | Source: Ambulatory Visit | Attending: Orthopaedic Surgery | Admitting: Orthopaedic Surgery

## 2016-07-25 ENCOUNTER — Ambulatory Visit (INDEPENDENT_AMBULATORY_CARE_PROVIDER_SITE_OTHER): Payer: Medicare Other | Admitting: Orthopaedic Surgery

## 2016-07-25 VITALS — BP 105/67 | Temp 97.3°F | Resp 18 | Ht 68.0 in | Wt 222.0 lb

## 2016-07-25 DIAGNOSIS — M25512 Pain in left shoulder: Secondary | ICD-10-CM | POA: Insufficient documentation

## 2016-07-25 DIAGNOSIS — G5622 Lesion of ulnar nerve, left upper limb: Secondary | ICD-10-CM | POA: Diagnosis not present

## 2016-07-25 NOTE — Progress Notes (Signed)
Subjective:    Patient ID: Terri [name], female    DOB: [birth year], 51 y.o.   MRN: [id number]  HPI She has about a three month history of pain in the left shoulder.  She has pain at night rolling over on it sleeping, has pain with overhead use.  It is getting worse.  She has some numbness in the ulnar nerve distribution on the left.  She has seen neurologist for this in the past and was told this could get worse.  She has no trauma.  She cannot take NSAIDs secondary to prior stomach surgery and removal of most of the stomach.  She has no redness.    She has seen Dr. Luan Pulling and I have copies of his notes.   Review of Systems  HENT: Negative for congestion.   Respiratory: Positive for shortness of breath. Negative for cough.   Cardiovascular: Negative for chest pain and leg swelling.  Endocrine: Positive for cold intolerance.  Musculoskeletal: Positive for arthralgias.  Allergic/Immunologic: Positive for environmental allergies.  Neurological: Positive for dizziness.   Past Medical History:  Diagnosis Date  . Arthritis   . Asthma   . COPD (chronic obstructive pulmonary disease) ([place])   . Diabetes mellitus   . GERD (gastroesophageal reflux disease)   . Obesity   . Osteoporosis   . Vertigo     Past Surgical History:  Procedure Laterality Date  . BLADDER SURGERY    . ESOPHAGOGASTRODUODENOSCOPY  11/17/2011   Procedure: ESOPHAGOGASTRODUODENOSCOPY (EGD);  Surgeon: Milus Banister, MD;  Location: [facility];  Service: Endoscopy;  Laterality: N/A;  . ESOPHAGOGASTRODUODENOSCOPY  01/02/2012   Procedure: ESOPHAGOGASTRODUODENOSCOPY (EGD);  Surgeon: Milus Banister, MD;  Location: [facility];  Service: Endoscopy;  Laterality: N/A;  . ESOPHAGOGASTRODUODENOSCOPY N/A 01/22/2013   Procedure: ESOPHAGOGASTRODUODENOSCOPY (EGD);  Surgeon: Jerene Bears, MD;  Location: [place];  Service: Gastroenterology;  Laterality: N/A;  . ESOPHAGOGASTRODUODENOSCOPY N/A 10/22/2013   Procedure:  ESOPHAGOGASTRODUODENOSCOPY (EGD);  Surgeon: Ladene Artist, MD;  Location: [facility] ENDOSCOPY;  Service: Endoscopy;  Laterality: N/A;  . HERNIA REPAIR    . LAPAROSCOPIC GASTRIC BANDING    . TUBAL LIGATION      Current Outpatient Prescriptions on File Prior to Visit  Medication Sig Dispense Refill  . albuterol (PROVENTIL HFA;VENTOLIN HFA) 108 (90 BASE) MCG/ACT inhaler Inhale 2 puffs into the lungs every 6 (six) hours as needed for wheezing or shortness of breath.    . benzonatate (TESSALON) 100 MG capsule Take 1 capsule (100 mg total) by mouth every 8 (eight) hours. 21 capsule 0  . clindamycin (CLEOCIN) 150 MG capsule Take 1 capsule (150 mg total) by mouth every 6 (six) hours. 40 capsule 0  . glipiZIDE-metformin (METAGLIP) 5-500 MG per tablet Take 1 tablet by mouth at bedtime.     Marland Kitchen guaiFENesin-codeine (ROBITUSSIN AC) 100-10 MG/5ML syrup Take 5 mLs by mouth 3 (three) times daily as needed for cough. 120 mL 0  . ibuprofen (ADVIL,MOTRIN) 800 MG tablet Take 800 mg by mouth daily as needed for moderate pain.    Marland Kitchen ipratropium-albuterol (DUONEB) 0.5-2.5 (3) MG/3ML SOLN Inhale 3 mLs into the lungs every 6 (six) hours as needed.     . Liraglutide (VICTOZA) 18 MG/3ML SOPN Inject 1.8 mg into the skin daily.     . predniSONE (DELTASONE) 20 MG tablet 2 tabs po daily x 4 days 8 tablet 0  . tiZANidine (ZANAFLEX) 2 MG tablet Take 2 mg by mouth every 6 (six) hours as needed  for muscle spasms.     No current facility-administered medications on file prior to visit.     Social History   Social History  . Marital status: Married    Spouse name: N/A  . Number of children: 4  . Years of education: N/A   Occupational History  . Disabled  Unemployed   Social History Main Topics  . Smoking status: Former Smoker    Quit date: 11/16/2005  . Smokeless tobacco: Never Used  . Alcohol use Yes     Comment: occasion - once or twice a year  . Drug use: No  . Sexual activity: Yes    Birth control/ protection:  Surgical   Other Topics Concern  . Not on file   Social History Narrative   2 caffeine drinks daily     Family History  Problem Relation Age of Onset  . Breast cancer Mother   . Diabetes Mother     Sister and Father  . Cancer Mother     breast  . COPD Mother   . Heart disease Father     and Sister  . Cirrhosis Father     Non-alcholic   . Diabetes Father   . Hypertension Father   . Kidney disease Father     chronic kidney stones  . Diabetes Sister   . Diabetes Brother   . Colon cancer Neg Hx     BP 105/67   Temp 97.3 F (36.3 C)   Resp 18   Ht 5\' 8"  (1.727 m)   Wt 222 lb (100.7 kg)   LMP 07/30/2010   BMI 33.75 kg/m      Objective:   Physical Exam  Constitutional: She is oriented to person, place, and time. She appears well-developed and well-nourished.  HENT:  Head: Normocephalic and atraumatic.  Eyes: Conjunctivae and EOM are normal. Pupils are equal, round, and reactive to light.  Neck: Normal range of motion. Neck supple.  Cardiovascular: Normal rate, regular rhythm and intact distal pulses.   Pulmonary/Chest: Effort normal.  Abdominal: Soft.  Musculoskeletal: She exhibits tenderness (Pain left shoulder, ROM forward 145, abduction 90, extension 10, internal 30, external 20, adduction 30.  She has no crepitus or effusion.  There is slight ulnar nerve decreased sensation and weakness of hand intrinsics.  Right negative.).  Neurological: She is alert and oriented to person, place, and time. She displays normal reflexes. No cranial nerve deficit. She exhibits normal muscle tone. Coordination normal.  Skin: Skin is warm and dry.  Psychiatric: She has a normal mood and affect. Her behavior is normal. Judgment and thought content normal.  Vitals reviewed.  X-rays had been done and reported separately.       Assessment & Plan:   Encounter Diagnoses  Name Primary?  . Pain in joint of left shoulder Yes  . Tardy ulnar nerve palsy, left    PROCEDURE  NOTE:  The patient request injection, verbal consent was obtained.  The left shoulder was prepped appropriately after time out was performed.   Sterile technique was observed and injection of 1 cc of Depo-Medrol 40 mg with several cc's of plain xylocaine. Anesthesia was provided by ethyl chloride and a 20-gauge needle was used to inject the shoulder area. A posterior approach was used.  The injection was tolerated well.  A band aid dressing was applied.  The patient was advised to apply ice later today and tomorrow to the injection sight as needed.  I will begin OT for her  in physical therapy.  Return in three weeks.  She cannot take any NSAIDs.  Call if any problem.  Precautions discussed.  Electronically Signed Sanjuana Kava, MD 2/21/20189:06 AM

## 2016-07-30 ENCOUNTER — Ambulatory Visit (HOSPITAL_COMMUNITY): Payer: Medicare Other

## 2016-08-02 ENCOUNTER — Ambulatory Visit (HOSPITAL_COMMUNITY): Payer: Medicare Other | Attending: Orthopaedic Surgery

## 2016-08-02 ENCOUNTER — Encounter (HOSPITAL_COMMUNITY): Payer: Self-pay

## 2016-08-02 DIAGNOSIS — G8929 Other chronic pain: Secondary | ICD-10-CM | POA: Diagnosis not present

## 2016-08-02 DIAGNOSIS — M25512 Pain in left shoulder: Secondary | ICD-10-CM | POA: Insufficient documentation

## 2016-08-02 DIAGNOSIS — M25612 Stiffness of left shoulder, not elsewhere classified: Secondary | ICD-10-CM | POA: Diagnosis not present

## 2016-08-02 DIAGNOSIS — R29898 Other symptoms and signs involving the musculoskeletal system: Secondary | ICD-10-CM | POA: Insufficient documentation

## 2016-08-02 NOTE — Patient Instructions (Signed)
Complete the following exercises 2-3 times a day.   Each exercise should be completed for 2-3 minutes each.  SHOULDER: Flexion On Table   Place hands on table, elbows straight. Move hips away from body. Press hands down into table. Hold ___ seconds. ___ reps per set, ___ sets per day, ___ days per week  Abduction (Passive)   With arm out to side, resting on table, lower head toward arm, keeping trunk away from table. Hold ____ seconds. Repeat ____ times. Do ____ sessions per day.  Copyright  VHI. All rights reserved.     Internal Rotation (Assistive)   Seated with elbow bent at right angle and held against side, slide arm on table surface in an inward arc. Repeat ____ times. Do ____ sessions per day. Activity: Use this motion to brush crumbs off the table.  Copyright  VHI. All rights reserved.

## 2016-08-02 NOTE — Therapy (Signed)
[place], [place], [address] Phone: [phone]   Fax:  [phone]  Occupational Therapy Evaluation  Patient Details  Name: Terri [name] MRN: [id number] Date of Birth: Nov 28, 1965 Referring Provider: Sanjuana Kava, MD  Encounter Date: 08/02/2016      OT End of Session - 08/02/16 1509    Visit Number 1   Number of Visits 8   Date for OT Re-Evaluation 09/01/16   Authorization Type 1) Medicare 2) Medicaid   Authorization Time Period before 10th visit   Authorization - Visit Number 1   Authorization - Number of Visits 10   OT Start Time 1350   OT Stop Time 1430   OT Time Calculation (min) 40 min   Activity Tolerance Patient tolerated treatment well   Behavior During Therapy [facility] for tasks assessed/performed      Past Medical History:  Diagnosis Date  . Arthritis   . Asthma   . COPD (chronic obstructive pulmonary disease) ([place])   . Diabetes mellitus   . GERD (gastroesophageal reflux disease)   . Obesity   . Osteoporosis   . Vertigo     Past Surgical History:  Procedure Laterality Date  . BLADDER SURGERY    . ESOPHAGOGASTRODUODENOSCOPY  11/17/2011   Procedure: ESOPHAGOGASTRODUODENOSCOPY (EGD);  Surgeon: Milus Banister, MD;  Location: [place];  Service: Endoscopy;  Laterality: N/A;  . ESOPHAGOGASTRODUODENOSCOPY  01/02/2012   Procedure: ESOPHAGOGASTRODUODENOSCOPY (EGD);  Surgeon: Milus Banister, MD;  Location: [place];  Service: Endoscopy;  Laterality: N/A;  . ESOPHAGOGASTRODUODENOSCOPY N/A 01/22/2013   Procedure: ESOPHAGOGASTRODUODENOSCOPY (EGD);  Surgeon: Jerene Bears, MD;  Location: [place];  Service: Gastroenterology;  Laterality: N/A;  . ESOPHAGOGASTRODUODENOSCOPY N/A 10/22/2013   Procedure: ESOPHAGOGASTRODUODENOSCOPY (EGD);  Surgeon: Ladene Artist, MD;  Location[facility] ENDOSCOPY;  Service: Endoscopy;  Laterality: N/A;  . HERNIA REPAIR    . LAPAROSCOPIC GASTRIC BANDING    . TUBAL  LIGATION      There were no vitals filed for this visit.      Subjective Assessment - 08/02/16 1359    Subjective  S: This may be related to a nerve issue I have in my neck. I saw a Neurologist 3 years ago that said he could fix it but to let him know what I couldn't take it anymore.   Pertinent History Patient is a 51 y/o female S/P left shoulder pain that began approximately 4-6 months ago. No known injury. Patient did report a cervical nerve issue that she is aware of. Has been to a Nuerologist 3 years ago that said he would do surgery to fix it. Patient has numbness in her left arm/hand that is related to this issue. Dr. Luna Glasgow has referred patient to occupational therapy for evaluation and treatment.    Patient Stated Goals To be able to put on deodorant under the right arm easier.    Currently in Pain? Yes   Pain Score 3    Pain Location Arm   Pain Orientation Left   Pain Descriptors / Indicators Heaviness   Pain Type Acute pain   Pain Onset More than a month ago   Pain Frequency Constant   Aggravating Factors  movement, use, and physical touch   Pain Relieving Factors Unknown   Effect of Pain on Daily Activities Severe effect   Multiple Pain Sites No           [facility] OT Assessment - 08/02/16 1400  Assessment   Diagnosis Left shoulder pain   Referring Provider Sanjuana Kava, MD   Onset Date --  4 months ago   Assessment 08/16/16 - follow up appointment   Prior Therapy None for this condition     Precautions   Precautions None     Restrictions   Weight Bearing Restrictions No     Balance Screen   Has the patient fallen in the past 6 months No     Home  Environment   Family/patient expects to be discharged to: Private residence   Living Arrangements --  Granddaughter     Prior Function   Level of Independence Independent   Vocation On disability   Leisure Pt enjoys going on cruises.     ADL   ADL comments Difficulty with reaching arm overhead, putting  on deodorant, fixhing hair, and any lifting. Patient prefers to sleep on left side and is unable to at this time.     Mobility   Mobility Status Independent     Written Expression   Dominant Hand Right     Vision - History   Baseline Vision Wears glasses all the time     Cognition   Overall Cognitive Status Within Functional Limits for tasks assessed     ROM / Strength   AROM / PROM / Strength AROM;PROM;Strength     Palpation   Palpation comment Max fascial restrictions in left upper arm, trapezius, and scapularis region.      AROM   Overall AROM Comments Assessed seated. IR/er adducted.   AROM Assessment Site Shoulder   Right/Left Shoulder Left   Left Shoulder Flexion 95 Degrees   Left Shoulder ABduction 50 Degrees   Left Shoulder Internal Rotation 90 Degrees   Left Shoulder External Rotation 53 Degrees     PROM   Overall PROM Comments Assessed supine. IR/er adducted.    PROM Assessment Site Shoulder   Right/Left Shoulder Left   Left Shoulder Flexion 121 Degrees   Left Shoulder ABduction 60 Degrees   Left Shoulder Internal Rotation 90 Degrees   Left Shoulder External Rotation 50 Degrees     Strength   Overall Strength Comments Assessed seated. IR/er adducted.   Strength Assessment Site Shoulder   Right/Left Shoulder Left   Left Shoulder Flexion 3-/5   Left Shoulder ABduction 3-/5   Left Shoulder Internal Rotation 3/5   Left Shoulder External Rotation 3-/5                         OT Education - 08/02/16 1506    Education provided Yes   Education Details Table slides   Person(s) Educated Patient   Methods Explanation;Demonstration;Verbal cues   Comprehension Returned demonstration;Verbalized understanding          OT Short Term Goals - 08/02/16 1701      OT SHORT TERM GOAL #1   Title patient will be educated and independent with HEP to increase functional performance with daily tasks when using LUE.   Time 4   Period Weeks   Status New      OT SHORT TERM GOAL #2   Title Patient will increase A/ROM in LUE to [facility] to increase ability to place deodorant on right armpit with less difficulty.   Time 4   Period Weeks   Status New     OT SHORT TERM GOAL #3   Title Patient will decrease pain level of LUE during use to 5/10 or less  to increase ability to complete normal functional tasks at home.    Time 4   Period Weeks   Status New     OT SHORT TERM GOAL #4   Title Patient will increase LUE strength to 3+/5 to increase ability to complete light household chores.   Time 4   Period Weeks   Status New     OT SHORT TERM GOAL #5   Title Patient will decrease fascial restrictions in LUE to min amount or less to increase functional reaching daily tasks.   Time 4   Period Weeks   Status New                  Plan - 08/02/16 1510    Clinical Impression Statement A: Patient is a 51 y/o female S/P left shoulder pain causing increased fascial restrictions, pain, and decreased strength and ROM resulting in difficulty completing daily tasks using LUE.    Rehab Potential Good   OT Frequency 2x / week   OT Duration 4 weeks   OT Treatment/Interventions Self-care/ADL training;Therapeutic exercise;Patient/family education;Dry needling;Manual Therapy;Ultrasound;Cryotherapy;Electrical Stimulation;Moist Heat;Passive range of motion;DME and/or AE instruction   Plan P: Patient will benefit from skilled OT services to increase functional performance during daily tasks using LUE. Treatment Plan: Myofascial release, manual stretching, P/ROM, AA/ROM, A/ROM, and shoulder and scapular strengthening.    OT Home Exercise Plan 3/1: Table slides   Recommended Other Services Recommend that patient follow up with Nuerologist regarding cervical issue/numbness.   Consulted and Agree with Plan of Care Patient      Patient will benefit from skilled therapeutic intervention in order to improve the following deficits and impairments:  Pain, Impaired  UE functional use, Increased fascial restricitons, Decreased range of motion, Impaired sensation, Decreased strength  Visit Diagnosis: Stiffness of left shoulder, not elsewhere classified - Plan: Ot plan of care cert/re-cert  Other symptoms and signs involving the musculoskeletal system - Plan: Ot plan of care cert/re-cert  Chronic left shoulder pain - Plan: Ot plan of care cert/re-cert      G-Codes - [id number]    Functional Assessment Tool Used (Outpatient only) Clinical judgment   Functional Limitation Carrying, moving and handling objects   Carrying, Moving and Handling Objects Current Status [id number]) At least 80 percent but less than 100 percent impaired, limited or restricted   Carrying, Moving and Handling Objects Goal Status [id number]) At least 40 percent but less than 60 percent impaired, limited or restricted      Problem List Patient Active Problem List   Diagnosis Date Noted  . Chest pain 08/19/2012  . COPD exacerbation ([place]) 08/19/2012  . Diabetes ([place]) 08/19/2012  . Lapband in [facility] 2009 02/13/2012  . Nonspecific (abnormal) findings on radiological and other examination of gastrointestinal tract 01/02/2012  . Foreign body in stomach 01/02/2012  . Dysphagia 11/17/2011    Ailene Ravel, OTR/L,CBIS  [phone]   08/02/2016, 5:10 PM  [place] [address] [place], [place], [address] Phone: [phone]   Fax:  [phone]  Name: Terri [name] MRN: [id number] Date of Birth: [birth year]

## 2016-08-07 ENCOUNTER — Ambulatory Visit (HOSPITAL_COMMUNITY): Payer: Medicare Other

## 2016-08-07 ENCOUNTER — Telehealth (HOSPITAL_COMMUNITY): Payer: Self-pay | Admitting: Pulmonary Disease

## 2016-08-07 NOTE — Telephone Encounter (Signed)
08/07/16 pt left [place] a message to say that she has been sick all morning... She isn't sure if its a stomach bug or what it is

## 2016-08-09 ENCOUNTER — Ambulatory Visit (HOSPITAL_COMMUNITY): Payer: Medicare Other

## 2016-08-09 ENCOUNTER — Telehealth (HOSPITAL_COMMUNITY): Payer: Self-pay

## 2016-08-09 NOTE — Telephone Encounter (Signed)
She is sick today and rescheduled for next Thrusday

## 2016-08-15 ENCOUNTER — Ambulatory Visit (INDEPENDENT_AMBULATORY_CARE_PROVIDER_SITE_OTHER): Payer: Medicare Other | Admitting: Orthopaedic Surgery

## 2016-08-15 ENCOUNTER — Ambulatory Visit (INDEPENDENT_AMBULATORY_CARE_PROVIDER_SITE_OTHER): Payer: Medicare Other

## 2016-08-15 VITALS — BP 115/74 | HR 64 | Temp 97.3°F | Ht 66.0 in | Wt 225.0 lb

## 2016-08-15 DIAGNOSIS — M542 Cervicalgia: Secondary | ICD-10-CM

## 2016-08-15 DIAGNOSIS — M25512 Pain in left shoulder: Secondary | ICD-10-CM | POA: Diagnosis not present

## 2016-08-15 NOTE — Progress Notes (Signed)
Patient Terri [name], female DOB:[birth year], 51 y.o. [id number]  Chief Complaint  Patient presents with  . Follow-up    Left shoulder    HPI  Terri [name] is a 51 y.o. female who has had left shoulder pain.  She has been to OT only once since I last saw her.  She still has shoulder pain and now complains of her neck.  She says about five years ago she saw a neurosurgeon for her neck and he offered surgery but she declined.  She got better.  But now she feels the pain in the left shoulder is similar to the pain she had then.  I will have her continue her OT.  X-rays were done of the neck today reported separately.  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  She may need MRI of the neck if not improved. HPI  Body mass index is 36.32 kg/m.  ROS  Review of Systems  HENT: Negative for congestion.   Respiratory: Positive for shortness of breath. Negative for cough.   Cardiovascular: Negative for chest pain and leg swelling.  Endocrine: Positive for cold intolerance.  Musculoskeletal: Positive for arthralgias.  Allergic/Immunologic: Positive for environmental allergies.  Neurological: Positive for dizziness.    Past Medical History:  Diagnosis Date  . Arthritis   . Asthma   . COPD (chronic obstructive pulmonary disease) ([place])   . Diabetes mellitus   . GERD (gastroesophageal reflux disease)   . Obesity   . Osteoporosis   . Vertigo     Past Surgical History:  Procedure Laterality Date  . BLADDER SURGERY    . ESOPHAGOGASTRODUODENOSCOPY  11/17/2011   Procedure: ESOPHAGOGASTRODUODENOSCOPY (EGD);  Surgeon: Milus Banister, MD;  Location: [place];  Service: Endoscopy;  Laterality: N/A;  . ESOPHAGOGASTRODUODENOSCOPY  01/02/2012   Procedure: ESOPHAGOGASTRODUODENOSCOPY (EGD);  Surgeon: Milus Banister, MD;  Location: [place];  Service: Endoscopy;  Laterality: N/A;  . ESOPHAGOGASTRODUODENOSCOPY N/A 01/22/2013   Procedure: ESOPHAGOGASTRODUODENOSCOPY (EGD);   Surgeon: Jerene Bears, MD;  Location: [place];  Service: Gastroenterology;  Laterality: N/A;  . ESOPHAGOGASTRODUODENOSCOPY N/A 10/22/2013   Procedure: ESOPHAGOGASTRODUODENOSCOPY (EGD);  Surgeon: Ladene Artist, MD;  Location[facility] ENDOSCOPY;  Service: Endoscopy;  Laterality: N/A;  . HERNIA REPAIR    . LAPAROSCOPIC GASTRIC BANDING    . TUBAL LIGATION      Family History  Problem Relation Age of Onset  . Breast cancer Mother   . Diabetes Mother     Sister and Father  . Cancer Mother     breast  . COPD Mother   . Heart disease Father     and Sister  . Cirrhosis Father     Non-alcholic   . Diabetes Father   . Hypertension Father   . Kidney disease Father     chronic kidney stones  . Diabetes Sister   . Diabetes Brother   . Colon cancer Neg Hx     Social History Social History  Substance Use Topics  . Smoking status: Former Smoker    Quit date: 11/16/2005  . Smokeless tobacco: Never Used  . Alcohol use Yes     Comment: occasion - once or twice a year    Allergies  Allergen Reactions  . Sulfa Antibiotics Other (See Comments)    "paralyzes me"  . Cephalexin Itching and Rash  . Ciprofloxacin Hcl Itching and Rash  . Penicillins Itching and Rash    Current Outpatient Prescriptions  Medication Sig  Dispense Refill  . albuterol (PROVENTIL HFA;VENTOLIN HFA) 108 (90 BASE) MCG/ACT inhaler Inhale 2 puffs into the lungs every 6 (six) hours as needed for wheezing or shortness of breath.    . benzonatate (TESSALON) 100 MG capsule Take 1 capsule (100 mg total) by mouth every 8 (eight) hours. (Patient not taking: Reported on 08/02/2016) 21 capsule 0  . clindamycin (CLEOCIN) 150 MG capsule Take 1 capsule (150 mg total) by mouth every 6 (six) hours. (Patient not taking: Reported on 08/02/2016) 40 capsule 0  . glipiZIDE-metformin (METAGLIP) 5-500 MG per tablet Take 1 tablet by mouth at bedtime.     Marland Kitchen guaiFENesin-codeine (ROBITUSSIN AC) 100-10 MG/5ML syrup Take 5 mLs by mouth 3 (three)  times daily as needed for cough. (Patient not taking: Reported on 08/02/2016) 120 mL 0  . ibuprofen (ADVIL,MOTRIN) 800 MG tablet Take 800 mg by mouth daily as needed for moderate pain.    Marland Kitchen ipratropium-albuterol (DUONEB) 0.5-2.5 (3) MG/3ML SOLN Inhale 3 mLs into the lungs every 6 (six) hours as needed.     . Liraglutide (VICTOZA) 18 MG/3ML SOPN Inject 1.8 mg into the skin daily.     . predniSONE (DELTASONE) 20 MG tablet 2 tabs po daily x 4 days (Patient not taking: Reported on 08/02/2016) 8 tablet 0  . tiZANidine (ZANAFLEX) 2 MG tablet Take 2 mg by mouth every 6 (six) hours as needed for muscle spasms.     No current facility-administered medications for this visit.      Physical Exam  Blood pressure 115/74, pulse 64, temperature 97.3 F (36.3 C), height 5\' 6"  (1.676 m), weight 225 lb (102.1 kg), last menstrual period 07/30/2010.  Constitutional: overall normal hygiene, normal nutrition, well developed, normal grooming, normal body habitus. Assistive device:none  Musculoskeletal: gait and station Limp none, muscle tone and strength are normal, no tremors or atrophy is present.  .  Neurological: coordination overall normal.  Deep tendon reflex/nerve stretch intact.  Sensation normal.  Cranial nerves II-XII intact.   Skin:   Normal overall no scars, lesions, ulcers or rashes. No psoriasis.  Psychiatric: Alert and oriented x 3.  Recent memory intact, remote memory unclear.  Normal mood and affect. Well groomed.  Good eye contact.  Cardiovascular: overall no swelling, no varicosities, no edema bilaterally, normal temperatures of the legs and arms, no clubbing, cyanosis and good capillary refill.  Lymphatic: palpation is normal.  Examination of left Upper Extremity is done.  Inspection:   Overall:  Elbow non-tender without crepitus or defects, forearm non-tender without crepitus or defects, wrist non-tender without crepitus or defects, hand non-tender.    Shoulder: with glenohumeral joint  tenderness, without effusion.   Upper arm: without swelling and tenderness   Range of motion:   Overall:  Full range of motion of the elbow, full range of motion of wrist and full range of motion in fingers.   Shoulder:  left  165 degrees forward flexion; 145 degrees abduction; 35 degrees internal rotation, 35 degrees external rotation, 15 degrees extension, 40 degrees adduction.   Stability:   Overall:  Shoulder, elbow and wrist stable   Strength and Tone:   Overall full shoulder muscles strength, full upper arm strength and normal upper arm bulk and tone.  Her neck has full motion but tender more moving to the left.  She has no spasm.  NV intact.  The patient has been educated about the nature of the problem(s) and counseled on treatment options.  The patient appeared to understand what I  have discussed and is in agreement with it.  Encounter Diagnoses  Name Primary?  . Neck pain Yes  . Pain in joint of left shoulder     PLAN Call if any problems.  Precautions discussed.  Continue current medications.   Return to clinic 3 weeks   Continue OT.  Electronically Signed Sanjuana Kava, MD 3/14/20189:38 AM

## 2016-08-16 ENCOUNTER — Encounter (HOSPITAL_COMMUNITY): Payer: Self-pay

## 2016-08-16 ENCOUNTER — Ambulatory Visit (HOSPITAL_COMMUNITY): Payer: Medicare Other

## 2016-08-16 DIAGNOSIS — M25612 Stiffness of left shoulder, not elsewhere classified: Secondary | ICD-10-CM

## 2016-08-16 DIAGNOSIS — M25512 Pain in left shoulder: Secondary | ICD-10-CM

## 2016-08-16 DIAGNOSIS — R29898 Other symptoms and signs involving the musculoskeletal system: Secondary | ICD-10-CM | POA: Diagnosis not present

## 2016-08-16 DIAGNOSIS — G8929 Other chronic pain: Secondary | ICD-10-CM

## 2016-08-16 NOTE — Therapy (Signed)
[place], [place], [address] Phone: [phone]   Fax:  [phone]  Occupational Therapy Treatment  Patient Details  Name: Terri [name] MRN: [id number] Date of Birth: [birth year] Referring Provider: Sanjuana Kava, MD  Encounter Date: 08/16/2016      OT End of Session - 08/16/16 1552    Visit Number 2   Number of Visits 8   Date for OT Re-Evaluation 09/01/16   Authorization Type 1) Medicare 2) Medicaid   Authorization Time Period before 10th visit   Authorization - Visit Number 2   Authorization - Number of Visits 10   OT Start Time 1347   OT Stop Time 1430   OT Time Calculation (min) 43 min   Activity Tolerance Patient tolerated treatment well   Behavior During Therapy [facility] for tasks assessed/performed      Past Medical History:  Diagnosis Date  . Arthritis   . Asthma   . COPD (chronic obstructive pulmonary disease) ([place])   . Diabetes mellitus   . GERD (gastroesophageal reflux disease)   . Obesity   . Osteoporosis   . Vertigo     Past Surgical History:  Procedure Laterality Date  . BLADDER SURGERY    . ESOPHAGOGASTRODUODENOSCOPY  11/17/2011   Procedure: ESOPHAGOGASTRODUODENOSCOPY (EGD);  Surgeon: Milus Banister, MD;  Location: [place];  Service: Endoscopy;  Laterality: N/A;  . ESOPHAGOGASTRODUODENOSCOPY  01/02/2012   Procedure: ESOPHAGOGASTRODUODENOSCOPY (EGD);  Surgeon: Milus Banister, MD;  Location: [place];  Service: Endoscopy;  Laterality: N/A;  . ESOPHAGOGASTRODUODENOSCOPY N/A 01/22/2013   Procedure: ESOPHAGOGASTRODUODENOSCOPY (EGD);  Surgeon: Jerene Bears, MD;  Location: [place];  Service: Gastroenterology;  Laterality: N/A;  . ESOPHAGOGASTRODUODENOSCOPY N/A 10/22/2013   Procedure: ESOPHAGOGASTRODUODENOSCOPY (EGD);  Surgeon: Ladene Artist, MD;  Location: [facility] ENDOSCOPY;  Service: Endoscopy;  Laterality: N/A;  . HERNIA REPAIR    . LAPAROSCOPIC GASTRIC BANDING    . TUBAL  LIGATION      There were no vitals filed for this visit.      Subjective Assessment - 08/16/16 1416    Subjective  S: I had x-rays taken at the [place] office and he said if my arm doesn't get better he'll do a MRI.   Currently in Pain? Yes   Pain Score 1    Pain Location Arm   Pain Orientation Left   Pain Descriptors / Indicators Heaviness   Pain Type Acute pain   Pain Onset More than a month ago   Pain Frequency Constant   Aggravating Factors  movement, use and physical touch   Pain Relieving Factors not moving it.   Effect of Pain on Daily Activities severe effect   Multiple Pain Sites No            [facility] OT Assessment - 08/16/16 1548      Assessment   Diagnosis Left shoulder pain     Precautions   Precautions None                  OT Treatments/Exercises (OP) - 08/16/16 1548      Exercises   Exercises Shoulder     Shoulder Exercises: Supine   Protraction PROM;10 reps   Horizontal ABduction PROM;10 reps   External Rotation PROM;10 reps   Internal Rotation PROM;10 reps   Flexion PROM;10 reps   ABduction PROM;10 reps     Shoulder Exercises: Seated   Elevation AROM;10 reps   Extension AROM;10 reps  Row AROM;10 reps     Shoulder Exercises: Therapy Ball   Flexion 10 reps   ABduction 10 reps     Shoulder Exercises: ROM/Strengthening   Thumb Tacks 1'   Prot/Ret//Elev/Dep 1'     Manual Therapy   Manual Therapy Myofascial release   Manual therapy comments Manual therapy completed prior to exercises.   Myofascial Release Myofascial release and manual stretching completed to left upper arm, trapezius, and scapularis region to decrease fascial restrictions and increase joint mobility in a pain free zone.                 OT Education - 08/16/16 1550    Education provided Yes   Education Details Patient was given OT evaluation print out to reference goals and plan of care.    Person(s) Educated Patient   Methods Explanation;Handout    Comprehension Verbalized understanding          OT Short Term Goals - 08/16/16 1554      OT SHORT TERM GOAL #1   Title patient will be educated and independent with HEP to increase functional performance with daily tasks when using LUE.   Time 4   Period Weeks   Status On-going     OT SHORT TERM GOAL #2   Title Patient will increase A/ROM in LUE to [facility] to increase ability to place deodorant on right armpit with less difficulty.   Time 4   Period Weeks   Status On-going     OT SHORT TERM GOAL #3   Title Patient will decrease pain level of LUE during use to 5/10 or less to increase ability to complete normal functional tasks at home.    Time 4   Period Weeks   Status On-going     OT SHORT TERM GOAL #4   Title Patient will increase LUE strength to 3+/5 to increase ability to complete light household chores.   Time 4   Period Weeks   Status On-going     OT SHORT TERM GOAL #5   Title Patient will decrease fascial restrictions in LUE to min amount or less to increase functional reaching daily tasks.   Time 4   Period Weeks   Status On-going                  Plan - 08/16/16 1552    Clinical Impression Statement A: Initiated myofascial release, manual stretching, and gentle stretching with therapy ball. Patient continues to have limitations with passive ROM although was able to tolerate a little bit past pain level. Patient did have a good response to myofascial release this session.    Plan P: Continue to work on increase passive ROM within patient's pain tolerance to increase ability to put deodorant on. Attempt PVC pipe slide.       Patient will benefit from skilled therapeutic intervention in order to improve the following deficits and impairments:  Pain, Impaired UE functional use, Increased fascial restricitons, Decreased range of motion, Impaired sensation, Decreased strength  Visit Diagnosis: Stiffness of left shoulder, not elsewhere classified  Other  symptoms and signs involving the musculoskeletal system  Chronic left shoulder pain    Problem List Patient Active Problem List   Diagnosis Date Noted  . Chest pain 08/19/2012  . COPD exacerbation ([place]) 08/19/2012  . Diabetes ([place]) 08/19/2012  . Lapband in [facility] 2009 02/13/2012  . Nonspecific (abnormal) findings on radiological and other examination of gastrointestinal tract 01/02/2012  . Foreign body in  stomach 01/02/2012  . Dysphagia 11/17/2011   Ailene Ravel, OTR/L,CBIS  [phone]  08/16/2016, 3:56 PM  [place] [address] [place], [place], [address] Phone: [phone]   Fax:  [phone]  Name: Terri [name] MRN: [id number] Date of Birth: 1965-08-11

## 2016-08-23 ENCOUNTER — Ambulatory Visit (HOSPITAL_COMMUNITY): Payer: Medicare Other | Admitting: Specialist

## 2016-08-23 DIAGNOSIS — M25612 Stiffness of left shoulder, not elsewhere classified: Secondary | ICD-10-CM | POA: Diagnosis not present

## 2016-08-23 DIAGNOSIS — R29898 Other symptoms and signs involving the musculoskeletal system: Secondary | ICD-10-CM | POA: Diagnosis not present

## 2016-08-23 DIAGNOSIS — M25512 Pain in left shoulder: Secondary | ICD-10-CM | POA: Diagnosis not present

## 2016-08-23 DIAGNOSIS — G8929 Other chronic pain: Secondary | ICD-10-CM | POA: Diagnosis not present

## 2016-08-23 NOTE — Therapy (Signed)
[place], [place], [address] Phone: [phone]   Fax:  [phone]  Occupational Therapy Treatment  Patient Details  Name: Terri [name] MRN: [id number] Date of Birth: Jul 07, 1965 Referring Provider: Sanjuana Kava, MD  Encounter Date: 08/23/2016      OT End of Session - 08/23/16 2135    Visit Number 3   Number of Visits 8   Date for OT Re-Evaluation 09/01/16   Authorization Type 1) Medicare 2) Medicaid   Authorization Time Period before 10th visit   Authorization - Visit Number 3   Authorization - Number of Visits 10   OT Start Time 1350   OT Stop Time 1430   OT Time Calculation (min) 40 min   Activity Tolerance Patient tolerated treatment well   Behavior During Therapy [facility] for tasks assessed/performed      Past Medical History:  Diagnosis Date  . Arthritis   . Asthma   . COPD (chronic obstructive pulmonary disease) ([place])   . Diabetes mellitus   . GERD (gastroesophageal reflux disease)   . Obesity   . Osteoporosis   . Vertigo     Past Surgical History:  Procedure Laterality Date  . BLADDER SURGERY    . ESOPHAGOGASTRODUODENOSCOPY  11/17/2011   Procedure: ESOPHAGOGASTRODUODENOSCOPY (EGD);  Surgeon: Milus Banister, MD;  Location: [place];  Service: Endoscopy;  Laterality: N/A;  . ESOPHAGOGASTRODUODENOSCOPY  01/02/2012   Procedure: ESOPHAGOGASTRODUODENOSCOPY (EGD);  Surgeon: Milus Banister, MD;  Location: [place];  Service: Endoscopy;  Laterality: N/A;  . ESOPHAGOGASTRODUODENOSCOPY N/A 01/22/2013   Procedure: ESOPHAGOGASTRODUODENOSCOPY (EGD);  Surgeon: Jerene Bears, MD;  Location: [place];  Service: Gastroenterology;  Laterality: N/A;  . ESOPHAGOGASTRODUODENOSCOPY N/A 10/22/2013   Procedure: ESOPHAGOGASTRODUODENOSCOPY (EGD);  Surgeon: Ladene Artist, MD;  Location: [facility] ENDOSCOPY;  Service: Endoscopy;  Laterality: N/A;  . HERNIA REPAIR    . LAPAROSCOPIC GASTRIC BANDING    . TUBAL  LIGATION      There were no vitals filed for this visit.      Subjective Assessment - 08/23/16 2134    Subjective  S:  I dont know if anyhing is going to help my shoulder.  I think I have a pinched nerve   Special Tests FOTO 41/100   Currently in Pain? Yes   Pain Score 5    Pain Location Shoulder   Pain Orientation Left   Pain Descriptors / Indicators Aching   Pain Type Acute pain            [facility] OT Assessment - 08/23/16 0001      Assessment   Diagnosis Left shoulder pain     Precautions   Precautions None     Observation/Other Assessments   Focus on Therapeutic Outcomes (FOTO)  41/100                  OT Treatments/Exercises (OP) - 08/23/16 0001      Exercises   Exercises Shoulder     Shoulder Exercises: Supine   Protraction PROM;AAROM;10 reps   Horizontal ABduction PROM;AAROM;10 reps   External Rotation PROM;AAROM;10 reps   Internal Rotation PROM;AAROM;10 reps   Flexion PROM;AAROM;10 reps   ABduction PROM;AAROM;10 reps     Shoulder Exercises: Seated   Elevation AROM;15 reps   Extension AROM;15 reps   Row AROM;15 reps     Shoulder Exercises: ROM/Strengthening   Other ROM/Strengthening Exercises pvc pipe slide into flexion and into abduction 10 times each with moderate  difficulty     Manual Therapy   Manual Therapy Myofascial release   Manual therapy comments Manual therapy completed prior to exercises.   Myofascial Release Myofascial release and manual stretching completed to left upper arm, trapezius, and scapularis region to decrease fascial restrictions and increase joint mobility in a pain free zone.                   OT Short Term Goals - 08/16/16 1554      OT SHORT TERM GOAL #1   Title patient will be educated and independent with HEP to increase functional performance with daily tasks when using LUE.   Time 4   Period Weeks   Status On-going     OT SHORT TERM GOAL #2   Title Patient will increase A/ROM in LUE to [facility]  to increase ability to place deodorant on right armpit with less difficulty.   Time 4   Period Weeks   Status On-going     OT SHORT TERM GOAL #3   Title Patient will decrease pain level of LUE during use to 5/10 or less to increase ability to complete normal functional tasks at home.    Time 4   Period Weeks   Status On-going     OT SHORT TERM GOAL #4   Title Patient will increase LUE strength to 3+/5 to increase ability to complete light household chores.   Time 4   Period Weeks   Status On-going     OT SHORT TERM GOAL #5   Title Patient will decrease fascial restrictions in LUE to min amount or less to increase functional reaching daily tasks.   Time 4   Period Weeks   Status On-going                  Plan - 08/23/16 2136    Clinical Impression Statement A:  Patient has hard end feel with P/ROM flexion at 100 degrees.  Patient began AA/ROM this date however quite limited due to pain.   Plan P:  Increase repetitions with pVC pipe slide for flexion and abduction.  Add modalities such as IFES for pain control.        Patient will benefit from skilled therapeutic intervention in order to improve the following deficits and impairments:  Pain, Impaired UE functional use, Increased fascial restricitons, Decreased range of motion, Impaired sensation, Decreased strength  Visit Diagnosis: Stiffness of left shoulder, not elsewhere classified  Other symptoms and signs involving the musculoskeletal system  Chronic left shoulder pain    Problem List Patient Active Problem List   Diagnosis Date Noted  . Chest pain 08/19/2012  . COPD exacerbation ([place]) 08/19/2012  . Diabetes ([place]) 08/19/2012  . Lapband in [facility] 2009 02/13/2012  . Nonspecific (abnormal) findings on radiological and other examination of gastrointestinal tract 01/02/2012  . Foreign body in stomach 01/02/2012  . Dysphagia 11/17/2011   Vangie Bicker, [place], OTR/L [phone]   08/23/2016, 9:41  PM  [place], [place], [address] Phone: [phone]   Fax:  [phone]  Name: Terri [name] MRN: [id number] Date of Birth: 17-Feb-1966

## 2016-08-28 ENCOUNTER — Telehealth (HOSPITAL_COMMUNITY): Payer: Self-pay | Admitting: Pulmonary Disease

## 2016-08-28 NOTE — Telephone Encounter (Signed)
08/28/16 pt cx the last appt scheudled.  She said since her last therapy session her arm has been really sore.  This was the last on her schedule and I asked her to call us if she needed more.

## 2016-08-30 ENCOUNTER — Ambulatory Visit (HOSPITAL_COMMUNITY): Payer: Medicare Other | Admitting: Specialist

## 2016-09-05 ENCOUNTER — Encounter: Payer: Self-pay | Admitting: Orthopaedic Surgery

## 2016-09-05 ENCOUNTER — Ambulatory Visit (INDEPENDENT_AMBULATORY_CARE_PROVIDER_SITE_OTHER): Payer: Medicare Other | Admitting: Orthopaedic Surgery

## 2016-09-05 VITALS — BP 116/72 | HR 66 | Temp 97.2°F | Ht 66.0 in | Wt 225.0 lb

## 2016-09-05 DIAGNOSIS — M542 Cervicalgia: Secondary | ICD-10-CM

## 2016-09-05 DIAGNOSIS — M25512 Pain in left shoulder: Secondary | ICD-10-CM | POA: Diagnosis not present

## 2016-09-05 NOTE — Progress Notes (Signed)
Patient Terri [name], female DOB:1965-07-29, 51 y.o. LKG:[id number]  Chief Complaint  Patient presents with  . Follow-up    neck and left shoulder pain    HPI  Terri [name] is a 51 y.o. female who has chronic pain of the neck and left sided paresthesias and left shoulder pain.  She is worse.  She went to OT and had to stop at it was making her worse.  She feels bad today.  I will get MRI as I am concerned about possible HNP of the neck.  She has some left sided weakness.  HPI  Body mass index is 36.32 kg/m.  ROS  Review of Systems  HENT: Negative for congestion.   Respiratory: Positive for shortness of breath. Negative for cough.   Cardiovascular: Negative for chest pain and leg swelling.  Endocrine: Positive for cold intolerance.  Musculoskeletal: Positive for arthralgias.  Allergic/Immunologic: Positive for environmental allergies.  Neurological: Positive for dizziness.    Past Medical History:  Diagnosis Date  . Arthritis   . Asthma   . COPD (chronic obstructive pulmonary disease) ([place])   . Diabetes mellitus   . GERD (gastroesophageal reflux disease)   . Obesity   . Osteoporosis   . Vertigo     Past Surgical History:  Procedure Laterality Date  . BLADDER SURGERY    . ESOPHAGOGASTRODUODENOSCOPY  11/17/2011   Procedure: ESOPHAGOGASTRODUODENOSCOPY (EGD);  Surgeon: Milus Banister, MD;  Location: [place];  Service: Endoscopy;  Laterality: N/A;  . ESOPHAGOGASTRODUODENOSCOPY  01/02/2012   Procedure: ESOPHAGOGASTRODUODENOSCOPY (EGD);  Surgeon: Milus Banister, MD;  Location: [place];  Service: Endoscopy;  Laterality: N/A;  . ESOPHAGOGASTRODUODENOSCOPY N/A 01/22/2013   Procedure: ESOPHAGOGASTRODUODENOSCOPY (EGD);  Surgeon: Jerene Bears, MD;  Location: [place];  Service: Gastroenterology;  Laterality: N/A;  . ESOPHAGOGASTRODUODENOSCOPY N/A 10/22/2013   Procedure: ESOPHAGOGASTRODUODENOSCOPY (EGD);  Surgeon: Ladene Artist, MD;  Location: [facility] ENDOSCOPY;   Service: Endoscopy;  Laterality: N/A;  . HERNIA REPAIR    . LAPAROSCOPIC GASTRIC BANDING    . TUBAL LIGATION      Family History  Problem Relation Age of Onset  . Breast cancer Mother   . Diabetes Mother     Sister and Father  . Cancer Mother     breast  . COPD Mother   . Heart disease Father     and Sister  . Cirrhosis Father     Non-alcholic   . Diabetes Father   . Hypertension Father   . Kidney disease Father     chronic kidney stones  . Diabetes Sister   . Diabetes Brother   . Colon cancer Neg Hx     Social History Social History  Substance Use Topics  . Smoking status: Former Smoker    Quit date: 11/16/2005  . Smokeless tobacco: Never Used  . Alcohol use Yes     Comment: occasion - once or twice a year    Allergies  Allergen Reactions  . Sulfa Antibiotics Other (See Comments)    "paralyzes me"  . Cephalexin Itching and Rash  . Ciprofloxacin Hcl Itching and Rash  . Penicillins Itching and Rash    Current Outpatient Prescriptions  Medication Sig Dispense Refill  . albuterol (PROVENTIL HFA;VENTOLIN HFA) 108 (90 BASE) MCG/ACT inhaler Inhale 2 puffs into the lungs every 6 (six) hours as needed for wheezing or shortness of breath.    . benzonatate (TESSALON) 100 MG capsule Take 1 capsule (100 mg total) by mouth every 8 (eight)  hours. (Patient not taking: Reported on 08/02/2016) 21 capsule 0  . clindamycin (CLEOCIN) 150 MG capsule Take 1 capsule (150 mg total) by mouth every 6 (six) hours. (Patient not taking: Reported on 08/02/2016) 40 capsule 0  . glipiZIDE-metformin (METAGLIP) 5-500 MG per tablet Take 1 tablet by mouth at bedtime.     Marland Kitchen guaiFENesin-codeine (ROBITUSSIN AC) 100-10 MG/5ML syrup Take 5 mLs by mouth 3 (three) times daily as needed for cough. (Patient not taking: Reported on 08/02/2016) 120 mL 0  . ibuprofen (ADVIL,MOTRIN) 800 MG tablet Take 800 mg by mouth daily as needed for moderate pain.    Marland Kitchen ipratropium-albuterol (DUONEB) 0.5-2.5 (3) MG/3ML SOLN Inhale  3 mLs into the lungs every 6 (six) hours as needed.     . Liraglutide (VICTOZA) 18 MG/3ML SOPN Inject 1.8 mg into the skin daily.     . predniSONE (DELTASONE) 20 MG tablet 2 tabs po daily x 4 days (Patient not taking: Reported on 08/02/2016) 8 tablet 0  . tiZANidine (ZANAFLEX) 2 MG tablet Take 2 mg by mouth every 6 (six) hours as needed for muscle spasms.     No current facility-administered medications for this visit.      Physical Exam  Blood pressure 116/72, pulse 66, temperature 97.2 F (36.2 C), height 5\' 6"  (1.676 m), weight 225 lb (102.1 kg), last menstrual period 07/30/2010.  Constitutional: overall normal hygiene, normal nutrition, well developed, normal grooming, normal body habitus. Assistive device:none  Musculoskeletal: gait and station Limp none, muscle tone and strength are normal, no tremors or atrophy is present.  .  Neurological: coordination overall normal.  Deep tendon reflex/nerve stretch intact.  Sensation normal.  Cranial nerves II-XII intact.   Skin:   Normal overall no scars, lesions, ulcers or rashes. No psoriasis.  Psychiatric: Alert and oriented x 3.  Recent memory intact, remote memory unclear.  Normal mood and affect. Well groomed.  Good eye contact.  Cardiovascular: overall no swelling, no varicosities, no edema bilaterally, normal temperatures of the legs and arms, no clubbing, cyanosis and good capillary refill.  Lymphatic: palpation is normal.  She has neck pain on the left side and tightness of the left upper trapezius.  She has slight weakness of the triceps on the left.  Reflexes are normal.  Grip is normal.  ROM of the left shoulder is painful but nearly full.  The patient has been educated about the nature of the problem(s) and counseled on treatment options.  The patient appeared to understand what I have discussed and is in agreement with it.  Encounter Diagnoses  Name Primary?  . Neck pain Yes  . Pain in joint of left shoulder       PLAN Call if any problems.  Precautions discussed.  Continue current medications.   Return to clinic Get MRi of the neck   Electronically Signed Sanjuana Kava, MD 4/4/201811:10 AM

## 2016-09-14 ENCOUNTER — Ambulatory Visit
Admission: RE | Admit: 2016-09-14 | Discharge: 2016-09-14 | Disposition: A | Discharge status: Home / Self Care | Payer: Medicare Other | Source: Ambulatory Visit | Attending: Orthopaedic Surgery | Admitting: Orthopaedic Surgery

## 2016-09-14 DIAGNOSIS — M4802 Spinal stenosis, cervical region: Secondary | ICD-10-CM | POA: Diagnosis not present

## 2016-09-14 DIAGNOSIS — M542 Cervicalgia: Secondary | ICD-10-CM

## 2016-09-18 ENCOUNTER — Ambulatory Visit (INDEPENDENT_AMBULATORY_CARE_PROVIDER_SITE_OTHER): Payer: Medicare Other | Admitting: Orthopaedic Surgery

## 2016-09-18 ENCOUNTER — Encounter: Payer: Self-pay | Admitting: Orthopaedic Surgery

## 2016-09-18 VITALS — BP 111/68 | HR 67 | Temp 97.5°F | Resp 18 | Ht 68.0 in | Wt 224.0 lb

## 2016-09-18 DIAGNOSIS — M4802 Spinal stenosis, cervical region: Secondary | ICD-10-CM

## 2016-09-18 NOTE — Progress Notes (Signed)
Patient Terri [name]:OZYYQM KEELEIGH TERRIS, female DOB:[birth year], 51 y.o. GNO:[id number]  Chief Complaint  Patient presents with  . Follow-up    MRI results of neck.    HPI  Terri [name] is a 51 y.o. female who has had neck pain and left sided paresthesias that are not improving.  She has a long history of neck pain.  She has been seen by neurosurgeon in [place] years ago and was told about possible surgery but she did not want any then.  She cannot remember the name of the doctor.  She had a MRI of the neck recently.  It shows: IMPRESSION: Right-sided osteophyte at C5-6 with cord flattening and mild spinal stenosis unchanged.  Small central disc protrusions at C3-4 and C4-5 unchanged.  Overall no change from the prior MRI 2016  I will have neurosurgery see her again.  Findings here are on the right side, she has symptoms on the left. HPI  Body mass index is 34.06 kg/m.  ROS  Review of Systems  HENT: Negative for congestion.   Respiratory: Positive for shortness of breath. Negative for cough.   Cardiovascular: Negative for chest pain and leg swelling.  Endocrine: Positive for cold intolerance.  Musculoskeletal: Positive for arthralgias.  Allergic/Immunologic: Positive for environmental allergies.  Neurological: Positive for dizziness.    Past Medical History:  Diagnosis Date  . Arthritis   . Asthma   . COPD (chronic obstructive pulmonary disease) ([place])   . Diabetes mellitus   . GERD (gastroesophageal reflux disease)   . Obesity   . Osteoporosis   . Vertigo     Past Surgical History:  Procedure Laterality Date  . BLADDER SURGERY    . ESOPHAGOGASTRODUODENOSCOPY  11/17/2011   Procedure: ESOPHAGOGASTRODUODENOSCOPY (EGD);  Surgeon: Milus Banister, MD;  Location: [place];  Service: Endoscopy;  Laterality: N/A;  . ESOPHAGOGASTRODUODENOSCOPY  01/02/2012   Procedure: ESOPHAGOGASTRODUODENOSCOPY (EGD);  Surgeon: Milus Banister, MD;  Location: [place];  Service:  Endoscopy;  Laterality: N/A;  . ESOPHAGOGASTRODUODENOSCOPY N/A 01/22/2013   Procedure: ESOPHAGOGASTRODUODENOSCOPY (EGD);  Surgeon: Jerene Bears, MD;  Location: [place];  Service: Gastroenterology;  Laterality: N/A;  . ESOPHAGOGASTRODUODENOSCOPY N/A 10/22/2013   Procedure: ESOPHAGOGASTRODUODENOSCOPY (EGD);  Surgeon: Ladene Artist, MD;  Location: [facility] ENDOSCOPY;  Service: Endoscopy;  Laterality: N/A;  . HERNIA REPAIR    . LAPAROSCOPIC GASTRIC BANDING    . TUBAL LIGATION      Family History  Problem Relation Age of Onset  . Breast cancer Mother   . Diabetes Mother     Sister and Father  . Cancer Mother     breast  . COPD Mother   . Heart disease Father     and Sister  . Cirrhosis Father     Non-alcholic   . Diabetes Father   . Hypertension Father   . Kidney disease Father     chronic kidney stones  . Diabetes Sister   . Diabetes Brother   . Colon cancer Neg Hx     Social History Social History  Substance Use Topics  . Smoking status: Former Smoker    Quit date: 11/16/2005  . Smokeless tobacco: Never Used  . Alcohol use Yes     Comment: occasion - once or twice a year    Allergies  Allergen Reactions  . Sulfa Antibiotics Other (See Comments)    "paralyzes me"  . Cephalexin Itching and Rash  . Ciprofloxacin Hcl Itching and Rash  . Penicillins Itching and Rash  Current Outpatient Prescriptions  Medication Sig Dispense Refill  . albuterol (PROVENTIL HFA;VENTOLIN HFA) 108 (90 BASE) MCG/ACT inhaler Inhale 2 puffs into the lungs every 6 (six) hours as needed for wheezing or shortness of breath.    . benzonatate (TESSALON) 100 MG capsule Take 1 capsule (100 mg total) by mouth every 8 (eight) hours. (Patient not taking: Reported on 08/02/2016) 21 capsule 0  . clindamycin (CLEOCIN) 150 MG capsule Take 1 capsule (150 mg total) by mouth every 6 (six) hours. (Patient not taking: Reported on 08/02/2016) 40 capsule 0  . glipiZIDE-metformin (METAGLIP) 5-500 MG per tablet Take 1  tablet by mouth at bedtime.     Marland Kitchen guaiFENesin-codeine (ROBITUSSIN AC) 100-10 MG/5ML syrup Take 5 mLs by mouth 3 (three) times daily as needed for cough. (Patient not taking: Reported on 08/02/2016) 120 mL 0  . ibuprofen (ADVIL,MOTRIN) 800 MG tablet Take 800 mg by mouth daily as needed for moderate pain.    Marland Kitchen ipratropium-albuterol (DUONEB) 0.5-2.5 (3) MG/3ML SOLN Inhale 3 mLs into the lungs every 6 (six) hours as needed.     . Liraglutide (VICTOZA) 18 MG/3ML SOPN Inject 1.8 mg into the skin daily.     . predniSONE (DELTASONE) 20 MG tablet 2 tabs po daily x 4 days (Patient not taking: Reported on 08/02/2016) 8 tablet 0  . tiZANidine (ZANAFLEX) 2 MG tablet Take 2 mg by mouth every 6 (six) hours as needed for muscle spasms.     No current facility-administered medications for this visit.      Physical Exam  Blood pressure 111/68, pulse 67, temperature 97.5 F (36.4 C), resp. rate 18, height 5\' 8"  (1.727 m), weight 224 lb (101.6 kg), last menstrual period 07/30/2010.  Constitutional: overall normal hygiene, normal nutrition, well developed, normal grooming, normal body habitus. Assistive device:none  Musculoskeletal: gait and station Limp none, muscle tone and strength are normal, no tremors or atrophy is present.  .  Neurological: coordination overall normal.  Deep tendon reflex/nerve stretch intact.  Sensation normal.  Cranial nerves II-XII intact.   Skin:   Normal overall no scars, lesions, ulcers or rashes. No psoriasis.  Psychiatric: Alert and oriented x 3.  Recent memory intact, remote memory unclear.  Normal mood and affect. Well groomed.  Good eye contact.  Cardiovascular: overall no swelling, no varicosities, no edema bilaterally, normal temperatures of the legs and arms, no clubbing, cyanosis and good capillary refill.  Lymphatic: palpation is normal.  Neck is tender with more pain on the left side of the neck and upper trapezius.  NV intact.    The patient has been educated about  the nature of the problem(s) and counseled on treatment options.  The patient appeared to understand what I have discussed and is in agreement with it.  Encounter Diagnosis  Name Primary?  . Spinal stenosis in cervical region Yes    PLAN Call if any problems.  Precautions discussed.  Continue current medications.   Return to clinic to see neurosurgeon   Electronically Signed Sanjuana Kava, MD 4/17/20184:35 PM

## 2016-10-09 DIAGNOSIS — K210 Gastro-esophageal reflux disease with esophagitis: Secondary | ICD-10-CM | POA: Diagnosis not present

## 2016-10-09 DIAGNOSIS — E119 Type 2 diabetes mellitus without complications: Secondary | ICD-10-CM | POA: Diagnosis not present

## 2016-10-09 DIAGNOSIS — M4722 Other spondylosis with radiculopathy, cervical region: Secondary | ICD-10-CM | POA: Diagnosis not present

## 2016-10-09 DIAGNOSIS — R030 Elevated blood-pressure reading, without diagnosis of hypertension: Secondary | ICD-10-CM | POA: Diagnosis not present

## 2016-10-09 DIAGNOSIS — M25512 Pain in left shoulder: Secondary | ICD-10-CM | POA: Diagnosis not present

## 2016-10-17 ENCOUNTER — Other Ambulatory Visit: Payer: Self-pay | Admitting: Pulmonary Disease

## 2016-10-17 DIAGNOSIS — M25512 Pain in left shoulder: Secondary | ICD-10-CM

## 2016-10-27 ENCOUNTER — Ambulatory Visit
Admission: RE | Admit: 2016-10-27 | Discharge: 2016-10-27 | Disposition: A | Discharge status: Home / Self Care | Payer: Medicare Other | Source: Ambulatory Visit | Attending: Pulmonary Disease | Admitting: Pulmonary Disease

## 2016-10-27 DIAGNOSIS — M25512 Pain in left shoulder: Secondary | ICD-10-CM

## 2016-10-27 DIAGNOSIS — M7582 Other shoulder lesions, left shoulder: Secondary | ICD-10-CM | POA: Diagnosis not present

## 2016-11-08 ENCOUNTER — Encounter: Payer: Self-pay | Admitting: Orthopaedic Surgery

## 2016-11-08 ENCOUNTER — Ambulatory Visit (INDEPENDENT_AMBULATORY_CARE_PROVIDER_SITE_OTHER): Payer: Medicare Other | Admitting: Orthopaedic Surgery

## 2016-11-08 VITALS — BP 112/62 | HR 65 | Temp 97.5°F | Ht 68.0 in | Wt 224.0 lb

## 2016-11-08 DIAGNOSIS — M25512 Pain in left shoulder: Secondary | ICD-10-CM

## 2016-11-08 MED ORDER — PREDNISONE 5 MG (21) PO TBPK: ORAL_TABLET | ORAL | 0 refills | Status: DC

## 2016-11-08 NOTE — Progress Notes (Signed)
Patient Terri [name], female DOB:04/30/1966, 51 y.o. DZH:[id number]  Chief Complaint  Patient presents with  . Follow-up    left shoulder mri review    HPI  Terri [name] is a 51 y.o. female who has had pain of the left shoulder. She had a MRI which shows: IMPRESSION: 1. Mild tendinosis of the supraspinatus and infraspinatus tendons without a tear. 2. Moderate arthropathy of the acromioclavicular joint.  I have explained the findings to her.  I have recommended water therapy.  She will begin this.  HPI  Body mass index is 34.06 kg/m.  ROS  Review of Systems  HENT: Negative for congestion.   Respiratory: Positive for shortness of breath. Negative for cough.   Cardiovascular: Negative for chest pain and leg swelling.  Endocrine: Positive for cold intolerance.  Musculoskeletal: Positive for arthralgias.  Allergic/Immunologic: Positive for environmental allergies.  Neurological: Positive for dizziness.    Past Medical History:  Diagnosis Date  . Arthritis   . Asthma   . COPD (chronic obstructive pulmonary disease) ([place])   . Diabetes mellitus   . GERD (gastroesophageal reflux disease)   . Obesity   . Osteoporosis   . Vertigo     Past Surgical History:  Procedure Laterality Date  . BLADDER SURGERY    . ESOPHAGOGASTRODUODENOSCOPY  11/17/2011   Procedure: ESOPHAGOGASTRODUODENOSCOPY (EGD);  Surgeon: Milus Banister, MD;  Location: [place];  Service: Endoscopy;  Laterality: N/A;  . ESOPHAGOGASTRODUODENOSCOPY  01/02/2012   Procedure: ESOPHAGOGASTRODUODENOSCOPY (EGD);  Surgeon: Milus Banister, MD;  Location: [place];  Service: Endoscopy;  Laterality: N/A;  . ESOPHAGOGASTRODUODENOSCOPY N/A 01/22/2013   Procedure: ESOPHAGOGASTRODUODENOSCOPY (EGD);  Surgeon: Jerene Bears, MD;  Location: [place];  Service: Gastroenterology;  Laterality: N/A;  . ESOPHAGOGASTRODUODENOSCOPY N/A 10/22/2013   Procedure: ESOPHAGOGASTRODUODENOSCOPY (EGD);  Surgeon: Ladene Artist, MD;  Location: [facility] ENDOSCOPY;  Service: Endoscopy;  Laterality: N/A;  . HERNIA REPAIR    . LAPAROSCOPIC GASTRIC BANDING    . TUBAL LIGATION      Family History  Problem Relation Age of Onset  . Breast cancer Mother   . Diabetes Mother        Sister and Father  . Cancer Mother        breast  . COPD Mother   . Heart disease Father        and Sister  . Cirrhosis Father        Non-alcholic   . Diabetes Father   . Hypertension Father   . Kidney disease Father        chronic kidney stones  . Diabetes Sister   . Diabetes Brother   . Colon cancer Neg Hx     Social History Social History  Substance Use Topics  . Smoking status: Former Smoker    Quit date: 11/16/2005  . Smokeless tobacco: Never Used  . Alcohol use Yes     Comment: occasion - once or twice a year    Allergies  Allergen Reactions  . Sulfa Antibiotics Other (See Comments)    "paralyzes me"  . Cephalexin Itching and Rash  . Ciprofloxacin Hcl Itching and Rash  . Penicillins Itching and Rash    Current Outpatient Prescriptions  Medication Sig Dispense Refill  . albuterol (PROVENTIL HFA;VENTOLIN HFA) 108 (90 BASE) MCG/ACT inhaler Inhale 2 puffs into the lungs every 6 (six) hours as needed for wheezing or shortness of breath.    . benzonatate (TESSALON) 100 MG capsule Take 1 capsule (100 mg  total) by mouth every 8 (eight) hours. (Patient not taking: Reported on 08/02/2016) 21 capsule 0  . clindamycin (CLEOCIN) 150 MG capsule Take 1 capsule (150 mg total) by mouth every 6 (six) hours. (Patient not taking: Reported on 08/02/2016) 40 capsule 0  . glipiZIDE-metformin (METAGLIP) 5-500 MG per tablet Take 1 tablet by mouth at bedtime.     Marland Kitchen guaiFENesin-codeine (ROBITUSSIN AC) 100-10 MG/5ML syrup Take 5 mLs by mouth 3 (three) times daily as needed for cough. (Patient not taking: Reported on 08/02/2016) 120 mL 0  . ibuprofen (ADVIL,MOTRIN) 800 MG tablet Take 800 mg by mouth daily as needed for moderate pain.    Marland Kitchen  ipratropium-albuterol (DUONEB) 0.5-2.5 (3) MG/3ML SOLN Inhale 3 mLs into the lungs every 6 (six) hours as needed.     . Liraglutide (VICTOZA) 18 MG/3ML SOPN Inject 1.8 mg into the skin daily.     . predniSONE (STERAPRED UNI-PAK 21 TAB) 5 MG (21) TBPK tablet Take 6 pills first day; 5 pills second day; 4 pills third day; 3 pills fourth day; 2 pills next day and 1 pill last day. 21 tablet 0  . tiZANidine (ZANAFLEX) 2 MG tablet Take 2 mg by mouth every 6 (six) hours as needed for muscle spasms.     No current facility-administered medications for this visit.      Physical Exam  Blood pressure 112/62, pulse 65, temperature 97.5 F (36.4 C), height 5\' 8"  (1.727 m), weight 224 lb (101.6 kg), last menstrual period 07/30/2010.  Constitutional: overall normal hygiene, normal nutrition, well developed, normal grooming, normal body habitus. Assistive device:none  Musculoskeletal: gait and station Limp none, muscle tone and strength are normal, no tremors or atrophy is present.  .  Neurological: coordination overall normal.  Deep tendon reflex/nerve stretch intact.  Sensation normal.  Cranial nerves II-XII intact.   Skin:   Normal overall no scars, lesions, ulcers or rashes. No psoriasis.  Psychiatric: Alert and oriented x 3.  Recent memory intact, remote memory unclear.  Normal mood and affect. Well groomed.  Good eye contact.  Cardiovascular: overall no swelling, no varicosities, no edema bilaterally, normal temperatures of the legs and arms, no clubbing, cyanosis and good capillary refill.  Lymphatic: palpation is normal.  Left shoulder with better ROM today, nearly full but painful in the extremes.  NV intact.  The patient has been educated about the nature of the problem(s) and counseled on treatment options.  The patient appeared to understand what I have discussed and is in agreement with it.  Encounter Diagnosis  Name Primary?  . Pain in joint of left shoulder Yes    PLAN Call if  any problems.  Precautions discussed.  Continue current medications.   Return to clinic 1 month   Electronically Signed Sanjuana Kava, MD 6/7/201810:18 AM

## 2016-11-13 DIAGNOSIS — E119 Type 2 diabetes mellitus without complications: Secondary | ICD-10-CM | POA: Diagnosis not present

## 2016-12-06 ENCOUNTER — Encounter (HOSPITAL_COMMUNITY): Payer: Self-pay | Admitting: Occupational Therapy

## 2016-12-06 NOTE — Therapy (Signed)
[place], [place], [address] Phone: [phone]   Fax:  [phone]  Patient Details  Name: Terri [name] MRN: [id number] Date of Birth: 1965/12/02 Referring Provider:  No ref. provider found  Encounter Date: 12/06/2016  OCCUPATIONAL THERAPY DISCHARGE SUMMARY  Visits from Start of Care: 3  Current functional level related to goals / functional outcomes: Unknown. Pt canceled appointments after last OT session reporting therapy was making her arm sore.    Remaining deficits: Unknown.    Education / Equipment: N/A  Plan: Patient agrees to discharge.  Patient goals were not met. Patient is being discharged due to the patient's request.  ?????      Guadelupe Sabin, OTR/L  [phone] 12/06/2016, 2:17 PM     [facility] [address] [place], [place], [address] Phone: [phone]   Fax:  [phone]

## 2016-12-11 ENCOUNTER — Ambulatory Visit: Payer: Medicare Other | Admitting: Orthopaedic Surgery

## 2016-12-31 DIAGNOSIS — N390 Urinary tract infection, site not specified: Secondary | ICD-10-CM | POA: Diagnosis not present

## 2017-03-19 DIAGNOSIS — M79675 Pain in left toe(s): Secondary | ICD-10-CM | POA: Diagnosis not present

## 2017-03-19 DIAGNOSIS — B351 Tinea unguium: Secondary | ICD-10-CM | POA: Diagnosis not present

## 2017-03-25 DIAGNOSIS — Z9884 Bariatric surgery status: Secondary | ICD-10-CM | POA: Diagnosis not present

## 2017-03-25 DIAGNOSIS — J449 Chronic obstructive pulmonary disease, unspecified: Secondary | ICD-10-CM | POA: Diagnosis not present

## 2017-03-25 DIAGNOSIS — E785 Hyperlipidemia, unspecified: Secondary | ICD-10-CM | POA: Diagnosis not present

## 2017-03-25 DIAGNOSIS — Z23 Encounter for immunization: Secondary | ICD-10-CM | POA: Diagnosis not present

## 2017-03-25 DIAGNOSIS — E119 Type 2 diabetes mellitus without complications: Secondary | ICD-10-CM | POA: Diagnosis not present

## 2017-04-01 DIAGNOSIS — E559 Vitamin D deficiency, unspecified: Secondary | ICD-10-CM | POA: Diagnosis not present

## 2017-04-01 DIAGNOSIS — Z903 Acquired absence of stomach [part of]: Secondary | ICD-10-CM | POA: Diagnosis not present

## 2017-04-01 DIAGNOSIS — K912 Postsurgical malabsorption, not elsewhere classified: Secondary | ICD-10-CM | POA: Diagnosis not present

## 2017-04-29 DIAGNOSIS — Z903 Acquired absence of stomach [part of]: Secondary | ICD-10-CM | POA: Diagnosis not present

## 2017-04-29 DIAGNOSIS — K912 Postsurgical malabsorption, not elsewhere classified: Secondary | ICD-10-CM | POA: Diagnosis not present

## 2017-04-29 DIAGNOSIS — Z9884 Bariatric surgery status: Secondary | ICD-10-CM | POA: Diagnosis not present

## 2017-05-08 DIAGNOSIS — E782 Mixed hyperlipidemia: Secondary | ICD-10-CM | POA: Diagnosis not present

## 2017-05-08 DIAGNOSIS — Z9884 Bariatric surgery status: Secondary | ICD-10-CM | POA: Diagnosis not present

## 2017-05-08 DIAGNOSIS — J449 Chronic obstructive pulmonary disease, unspecified: Secondary | ICD-10-CM | POA: Diagnosis not present

## 2017-05-08 DIAGNOSIS — E559 Vitamin D deficiency, unspecified: Secondary | ICD-10-CM | POA: Diagnosis not present

## 2017-05-08 DIAGNOSIS — E118 Type 2 diabetes mellitus with unspecified complications: Secondary | ICD-10-CM | POA: Diagnosis not present

## 2017-06-11 DIAGNOSIS — Z9884 Bariatric surgery status: Secondary | ICD-10-CM | POA: Diagnosis not present

## 2017-06-11 DIAGNOSIS — Z713 Dietary counseling and surveillance: Secondary | ICD-10-CM | POA: Diagnosis not present

## 2017-06-25 DIAGNOSIS — E1165 Type 2 diabetes mellitus with hyperglycemia: Secondary | ICD-10-CM | POA: Diagnosis not present

## 2017-06-25 DIAGNOSIS — J449 Chronic obstructive pulmonary disease, unspecified: Secondary | ICD-10-CM | POA: Diagnosis not present

## 2017-06-25 DIAGNOSIS — M158 Other polyosteoarthritis: Secondary | ICD-10-CM | POA: Diagnosis not present

## 2017-06-25 DIAGNOSIS — Z9884 Bariatric surgery status: Secondary | ICD-10-CM | POA: Diagnosis not present

## 2017-06-25 DIAGNOSIS — E785 Hyperlipidemia, unspecified: Secondary | ICD-10-CM | POA: Diagnosis not present

## 2017-06-25 DIAGNOSIS — E119 Type 2 diabetes mellitus without complications: Secondary | ICD-10-CM | POA: Diagnosis not present

## 2018-06-05 DIAGNOSIS — K912 Postsurgical malabsorption, not elsewhere classified: Secondary | ICD-10-CM | POA: Insufficient documentation

## 2018-08-11 ENCOUNTER — Emergency Department (HOSPITAL_COMMUNITY)
Admission: EM | Admit: 2018-08-11 | Discharge: 2018-08-12 | Disposition: A | Discharge status: Home / Self Care | Payer: 59 | Attending: Emergency Medicine | Admitting: Emergency Medicine

## 2018-08-11 ENCOUNTER — Other Ambulatory Visit: Payer: Self-pay

## 2018-08-11 ENCOUNTER — Encounter (HOSPITAL_COMMUNITY): Payer: Self-pay | Admitting: Emergency Medicine

## 2018-08-11 DIAGNOSIS — Z7984 Long term (current) use of oral hypoglycemic drugs: Secondary | ICD-10-CM | POA: Insufficient documentation

## 2018-08-11 DIAGNOSIS — Z79899 Other long term (current) drug therapy: Secondary | ICD-10-CM | POA: Diagnosis not present

## 2018-08-11 DIAGNOSIS — E119 Type 2 diabetes mellitus without complications: Secondary | ICD-10-CM | POA: Diagnosis not present

## 2018-08-11 DIAGNOSIS — K529 Noninfective gastroenteritis and colitis, unspecified: Secondary | ICD-10-CM | POA: Diagnosis not present

## 2018-08-11 DIAGNOSIS — R1013 Epigastric pain: Secondary | ICD-10-CM | POA: Diagnosis present

## 2018-08-11 DIAGNOSIS — Z87891 Personal history of nicotine dependence: Secondary | ICD-10-CM | POA: Insufficient documentation

## 2018-08-11 DIAGNOSIS — J449 Chronic obstructive pulmonary disease, unspecified: Secondary | ICD-10-CM | POA: Insufficient documentation

## 2018-08-11 NOTE — ED Triage Notes (Signed)
Pt here with abdominal pain versus "gas" since 2pm today. Pt has had weight loss surgery and is on Contrave to aide in weightloss and is concerned that it could be her gallbladder "acting up" and that if she needed sx for it; "would need to stop Contrave at least 72hrs prior to sx because it blocks effects of pain meds and I want to be checked out for it". In addition, pt tried OTC gasx and took a suppository to try and have BM and get rid of pain but it did not help pain.

## 2018-08-12 ENCOUNTER — Emergency Department (HOSPITAL_COMMUNITY): Payer: 59

## 2018-08-12 ENCOUNTER — Telehealth: Payer: Self-pay | Admitting: Gastroenterology

## 2018-08-12 DIAGNOSIS — K529 Noninfective gastroenteritis and colitis, unspecified: Secondary | ICD-10-CM | POA: Diagnosis not present

## 2018-08-12 LAB — URINALYSIS, ROUTINE W REFLEX MICROSCOPIC
BILIRUBIN URINE: NEGATIVE
Glucose, UA: NEGATIVE mg/dL
Hgb urine dipstick: NEGATIVE
Ketones, ur: NEGATIVE mg/dL
Leukocytes,Ua: NEGATIVE
NITRITE: NEGATIVE
Protein, ur: NEGATIVE mg/dL
pH: 5.5 (ref 5.0–8.0)

## 2018-08-12 LAB — COMPREHENSIVE METABOLIC PANEL
ALT: 14 U/L (ref 0–44)
ANION GAP: 7 (ref 5–15)
AST: 18 U/L (ref 15–41)
Albumin: 4.1 g/dL (ref 3.5–5.0)
Alkaline Phosphatase: 97 U/L (ref 38–126)
BUN: 14 mg/dL (ref 6–20)
CHLORIDE: 104 mmol/L (ref 98–111)
CO2: 26 mmol/L (ref 22–32)
Calcium: 9.4 mg/dL (ref 8.9–10.3)
Creatinine, Ser: 0.86 mg/dL (ref 0.44–1.00)
GFR calc non Af Amer: >60 mL/min (ref >60)
Glucose, Bld: 137 mg/dL — ABNORMAL HIGH (ref 70–99)
Potassium: 3.8 mmol/L (ref 3.5–5.1)
SODIUM: 137 mmol/L (ref 135–145)
Total Bilirubin: 0.4 mg/dL (ref 0.3–1.2)
Total Protein: 7.7 g/dL (ref 6.5–8.1)

## 2018-08-12 LAB — CBC WITH DIFFERENTIAL/PLATELET
ABS IMMATURE GRANULOCYTES: 0.04 10*3/uL (ref 0.00–0.07)
BASOS ABS: 0 10*3/uL (ref 0.0–0.1)
Basophils Relative: 0 %
Eosinophils Absolute: 0.1 10*3/uL (ref 0.0–0.5)
Eosinophils Relative: 1 %
HCT: 45.2 % (ref 36.0–46.0)
HEMOGLOBIN: 14.5 g/dL (ref 12.0–15.0)
Immature Granulocytes: 0 %
LYMPHS PCT: 20 %
Lymphs Abs: 2.5 10*3/uL (ref 0.7–4.0)
MCH: 28.3 pg (ref 26.0–34.0)
MCHC: 32.1 g/dL (ref 30.0–36.0)
MCV: 88.1 fL (ref 80.0–100.0)
Monocytes Absolute: 0.4 10*3/uL (ref 0.1–1.0)
Monocytes Relative: 4 %
NEUTROS ABS: 9.3 10*3/uL — AB (ref 1.7–7.7)
NRBC: 0 % (ref 0.0–0.2)
Neutrophils Relative %: 75 %
Platelets: 251 10*3/uL (ref 150–400)
RBC: 5.13 MIL/uL — AB (ref 3.87–5.11)
RDW: 12.7 % (ref 11.5–15.5)
WBC: 12.4 10*3/uL — AB (ref 4.0–10.5)

## 2018-08-12 LAB — I-STAT BETA HCG BLOOD, ED (MC, WL, AP ONLY)

## 2018-08-12 LAB — LIPASE, BLOOD: Lipase: 33 U/L (ref 11–51)

## 2018-08-12 MED ORDER — IOHEXOL 300 MG/ML  SOLN
100.0000 mL | Freq: Once | INTRAMUSCULAR | Status: AC | PRN
  Administered 2018-08-12: 100 mL via INTRAVENOUS

## 2018-08-12 MED ORDER — ONDANSETRON HCL 4 MG PO TABS: 4.0000 mg | ORAL_TABLET | Freq: Three times a day (TID) | ORAL | 0 refills | Status: DC | PRN

## 2018-08-12 MED ORDER — METRONIDAZOLE 500 MG PO TABS: 500.0000 mg | ORAL_TABLET | Freq: Three times a day (TID) | ORAL | 0 refills | Status: DC

## 2018-08-12 MED ORDER — METRONIDAZOLE 500 MG PO TABS
500.0000 mg | ORAL_TABLET | Freq: Three times a day (TID) | ORAL | Status: DC
  Administered 2018-08-12: 500 mg via ORAL
  Filled 2018-08-12: qty 1

## 2018-08-12 MED ORDER — FENTANYL CITRATE (PF) 100 MCG/2ML IJ SOLN
50.0000 ug | Freq: Once | INTRAMUSCULAR | Status: AC
  Administered 2018-08-12: 50 ug via INTRAVENOUS
  Filled 2018-08-12: qty 2

## 2018-08-12 MED ORDER — SODIUM CHLORIDE 0.9 % IV BOLUS
1000.0000 mL | Freq: Once | INTRAVENOUS | Status: AC
  Administered 2018-08-12: 1000 mL via INTRAVENOUS

## 2018-08-12 MED ORDER — HYDROCODONE-ACETAMINOPHEN 5-325 MG PO TABS: 1.0000 | ORAL_TABLET | Freq: Once | ORAL | Status: DC

## 2018-08-12 MED ORDER — FAMOTIDINE IN NACL 20-0.9 MG/50ML-% IV SOLN
20.0000 mg | Freq: Once | INTRAVENOUS | Status: AC
  Administered 2018-08-12: 20 mg via INTRAVENOUS
  Filled 2018-08-12: qty 50

## 2018-08-12 MED ORDER — ONDANSETRON HCL 4 MG/2ML IJ SOLN
4.0000 mg | Freq: Once | INTRAMUSCULAR | Status: AC
  Administered 2018-08-12: 4 mg via INTRAVENOUS
  Filled 2018-08-12: qty 2

## 2018-08-12 MED ORDER — OMEPRAZOLE 20 MG PO CPDR: DELAYED_RELEASE_CAPSULE | ORAL | 0 refills | Status: DC

## 2018-08-12 MED ORDER — SODIUM CHLORIDE 0.9 % IV BOLUS
500.0000 mL | Freq: Once | INTRAVENOUS | Status: AC
  Administered 2018-08-12: 500 mL via INTRAVENOUS

## 2018-08-12 NOTE — ED Provider Notes (Signed)
[facility] EMERGENCY DEPARTMENT Provider Note   CSN: [id number] Arrival date & time: 08/11/18  2151  Time seen 1:05 AM (not in room at 12:55 AM).  History   Chief Complaint Chief Complaint  Patient presents with  . Abdominal Pain    HPI Terri [name] is a 53 y.o. female.     HPI patient states about 1:30 PM after eating some chili Fritos she started having epigastric abdominal pain that was not relieved by Gas-X or Tums.  She states she walked around the house but that seemed to make it hurt more.  She tried laying on her abdomen and rolling on the floor in case it was gas.  She states the pain does not radiate.  She denies having burning fluid in her throat.  She denies nausea, vomiting, or diarrhea.  She states she chronically has constipation.  She states she took a suppository today and had a BM once without relief.  She denies feeling her abdomen is bloated.  She states she is never had this pain before.  She is concerned that it is her gallbladder.  She also is concerned because she is on Contrave and that she could overdose if she was given pain medicine.  Patient had gastric sleeve done about 2 years ago in [place] by Dr. Volanda Napoleon.  PCP Sinda Du, MD   Past Medical History:  Diagnosis Date  . Arthritis   . Asthma   . COPD (chronic obstructive pulmonary disease) ([place])   . Diabetes mellitus   . GERD (gastroesophageal reflux disease)   . Obesity   . Osteoporosis   . Vertigo     Patient Active Problem List   Diagnosis Date Noted  . Chest pain 08/19/2012  . COPD exacerbation ([place]) 08/19/2012  . Diabetes ([place]) 08/19/2012  . Lapband in [facility] 2009 02/13/2012  . Nonspecific (abnormal) findings on radiological and other examination of gastrointestinal tract 01/02/2012  . Foreign body in stomach 01/02/2012  . Dysphagia 11/17/2011    Past Surgical History:  Procedure Laterality Date  . BLADDER SURGERY    . ESOPHAGOGASTRODUODENOSCOPY  11/17/2011    Procedure: ESOPHAGOGASTRODUODENOSCOPY (EGD);  Surgeon: Milus Banister, MD;  Location: [place];  Service: Endoscopy;  Laterality: N/A;  . ESOPHAGOGASTRODUODENOSCOPY  01/02/2012   Procedure: ESOPHAGOGASTRODUODENOSCOPY (EGD);  Surgeon: Milus Banister, MD;  Location: [place];  Service: Endoscopy;  Laterality: N/A;  . ESOPHAGOGASTRODUODENOSCOPY N/A 01/22/2013   Procedure: ESOPHAGOGASTRODUODENOSCOPY (EGD);  Surgeon: Jerene Bears, MD;  Location: [place];  Service: Gastroenterology;  Laterality: N/A;  . ESOPHAGOGASTRODUODENOSCOPY N/A 10/22/2013   Procedure: ESOPHAGOGASTRODUODENOSCOPY (EGD);  Surgeon: Ladene Artist, MD;  Location: [facility] ENDOSCOPY;  Service: Endoscopy;  Laterality: N/A;  . HERNIA REPAIR    . LAPAROSCOPIC GASTRIC BANDING    . TUBAL LIGATION       OB History   No obstetric history on file.      Home Medications    Prior to Admission medications   Medication Sig Start Date End Date Taking? Authorizing Provider  albuterol (PROVENTIL HFA;VENTOLIN HFA) 108 (90 BASE) MCG/ACT inhaler Inhale 2 puffs into the lungs every 6 (six) hours as needed for wheezing or shortness of breath.    [provider]  benzonatate (TESSALON) 100 MG capsule Take 1 capsule (100 mg total) by mouth every 8 (eight) hours. Patient not taking: Reported on 08/02/2016 06/08/16   Mesner, Corene Cornea, MD  clindamycin (CLEOCIN) 150 MG capsule Take 1 capsule (150 mg total) by mouth every 6 (six)  hours. Patient not taking: Reported on 08/02/2016 01/14/15   Nat Christen, MD  glipiZIDE-metformin (METAGLIP) 5-500 MG per tablet Take 1 tablet by mouth at bedtime.     [provider]  guaiFENesin-codeine (ROBITUSSIN AC) 100-10 MG/5ML syrup Take 5 mLs by mouth 3 (three) times daily as needed for cough. Patient not taking: Reported on 08/02/2016 08/18/14   Ashley Murrain, NP  ibuprofen (ADVIL,MOTRIN) 800 MG tablet Take 800 mg by mouth daily as needed for moderate pain.    [provider]   ipratropium-albuterol (DUONEB) 0.5-2.5 (3) MG/3ML SOLN Inhale 3 mLs into the lungs every 6 (six) hours as needed.  09/10/12   [provider]  Liraglutide (VICTOZA) 18 MG/3ML SOPN Inject 1.8 mg into the skin daily.     [provider]  metroNIDAZOLE (FLAGYL) 500 MG tablet Take 1 tablet (500 mg total) by mouth 3 (three) times daily. 08/12/18   Rolland Porter, MD  omeprazole (PRILOSEC) 20 MG capsule Take 1 po BID x 2 weeks then once a day 08/12/18   Rolland Porter, MD  ondansetron (ZOFRAN) 4 MG tablet Take 1 tablet (4 mg total) by mouth every 8 (eight) hours as needed for nausea or vomiting. 08/12/18   Rolland Porter, MD  predniSONE (STERAPRED UNI-PAK 21 TAB) 5 MG (21) TBPK tablet Take 6 pills first day; 5 pills second day; 4 pills third day; 3 pills fourth day; 2 pills next day and 1 pill last day. 11/08/16   Sanjuana Kava, MD  tiZANidine (ZANAFLEX) 2 MG tablet Take 2 mg by mouth every 6 (six) hours as needed for muscle spasms.    [provider]    Family History Family History  Problem Relation Age of Onset  . Breast cancer Mother   . Diabetes Mother        Sister and Father  . Cancer Mother        breast  . COPD Mother   . Heart disease Father        and Sister  . Cirrhosis Father        Non-alcholic   . Diabetes Father   . Hypertension Father   . Kidney disease Father        chronic kidney stones  . Diabetes Sister   . Diabetes Brother   . Colon cancer Neg Hx     Social History Social History   Tobacco Use  . Smoking status: Former Smoker    Last attempt to quit: 11/16/2005    Years since quitting: 12.7  . Smokeless tobacco: Never Used  Substance Use Topics  . Alcohol use: Yes    Comment: occasion - once or twice a year  . Drug use: No     Allergies   Sulfa antibiotics; Cephalexin; Ciprofloxacin hcl; and Penicillins   Review of Systems Review of Systems  All other systems reviewed and are negative.    Physical Exam Updated Vital Signs BP (!)  108/56   Pulse (!) 55   Temp 98.2 F (36.8 C) (Oral)   Resp 18   Ht 5\' 6"  (1.676 m)   Wt 89.8 kg   LMP 07/30/2010   SpO2 94%   BMI 31.96 kg/m   Physical Exam Vitals signs and nursing note reviewed.  Constitutional:      General: She is not in acute distress.    Appearance: She is well-developed.  HENT:     Head: Normocephalic and atraumatic.     Nose: Nose normal.  Mouth/Throat:     Mouth: Mucous membranes are dry.     Pharynx: No oropharyngeal exudate or posterior oropharyngeal erythema.  Eyes:     Extraocular Movements: Extraocular movements intact.     Conjunctiva/sclera: Conjunctivae normal.     Pupils: Pupils are equal, round, and reactive to light.  Neck:     Musculoskeletal: Normal range of motion.  Cardiovascular:     Rate and Rhythm: Normal rate and regular rhythm.     Heart sounds: No murmur.  Pulmonary:     Effort: Pulmonary effort is normal. No respiratory distress.     Breath sounds: Normal breath sounds. No wheezing.  Abdominal:     General: Abdomen is flat. Bowel sounds are normal. There is no distension.     Palpations: Abdomen is soft.     Tenderness: There is abdominal tenderness in the epigastric area. There is rebound. There is no guarding.    Musculoskeletal: Normal range of motion.        General: No deformity.  Skin:    General: Skin is warm and dry.     Findings: No erythema.  Neurological:     General: No focal deficit present.     Mental Status: She is alert and oriented to person, place, and time.  Psychiatric:        Mood and Affect: Mood normal.        Behavior: Behavior normal.        Thought Content: Thought content normal.      ED Treatments / Results  Labs (all labs ordered are listed, but only abnormal results are displayed) Results for orders placed or performed during the hospital encounter of 08/11/18  Comprehensive metabolic panel  Result Value Ref Range   Sodium 137 135 - 145 mmol/L   Potassium 3.8 3.5 - 5.1  mmol/L   Chloride 104 98 - 111 mmol/L   CO2 26 22 - 32 mmol/L   Glucose, Bld 137 (H) 70 - 99 mg/dL   BUN 14 6 - 20 mg/dL   Creatinine, Ser 0.86 0.44 - 1.00 mg/dL   Calcium 9.4 8.9 - 10.3 mg/dL   Total Protein 7.7 6.5 - 8.1 g/dL   Albumin 4.1 3.5 - 5.0 g/dL   AST 18 15 - 41 U/L   ALT 14 0 - 44 U/L   Alkaline Phosphatase 97 38 - 126 U/L   Total Bilirubin 0.4 0.3 - 1.2 mg/dL   GFR calc non Af Amer >60 >60 mL/min   GFR calc Af Amer >60 >60 mL/min   Anion gap 7 5 - 15  Lipase, blood  Result Value Ref Range   Lipase 33 11 - 51 U/L  CBC with Differential  Result Value Ref Range   WBC 12.4 (H) 4.0 - 10.5 K/uL   RBC 5.13 (H) 3.87 - 5.11 MIL/uL   Hemoglobin 14.5 12.0 - 15.0 g/dL   HCT 45.2 36.0 - 46.0 %   MCV 88.1 80.0 - 100.0 fL   MCH 28.3 26.0 - 34.0 pg   MCHC 32.1 30.0 - 36.0 g/dL   RDW 12.7 11.5 - 15.5 %   Platelets 251 150 - 400 K/uL   nRBC 0.0 0.0 - 0.2 %   Neutrophils Relative % 75 %   Neutro Abs 9.3 (H) 1.7 - 7.7 K/uL   Lymphocytes Relative 20 %   Lymphs Abs 2.5 0.7 - 4.0 K/uL   Monocytes Relative 4 %   Monocytes Absolute 0.4 0.1 - 1.0 K/uL  Eosinophils Relative 1 %   Eosinophils Absolute 0.1 0.0 - 0.5 K/uL   Basophils Relative 0 %   Basophils Absolute 0.0 0.0 - 0.1 K/uL   Immature Granulocytes 0 %   Abs Immature Granulocytes 0.04 0.00 - 0.07 K/uL  Urinalysis, Routine w reflex microscopic  Result Value Ref Range   Color, Urine YELLOW YELLOW   APPearance CLEAR CLEAR   Specific Gravity, Urine >1.030 (H) 1.005 - 1.030   pH 5.5 5.0 - 8.0   Glucose, UA NEGATIVE NEGATIVE mg/dL   Hgb urine dipstick NEGATIVE NEGATIVE   Bilirubin Urine NEGATIVE NEGATIVE   Ketones, ur NEGATIVE NEGATIVE mg/dL   Protein, ur NEGATIVE NEGATIVE mg/dL   Nitrite NEGATIVE NEGATIVE   Leukocytes,Ua NEGATIVE NEGATIVE  I-Stat beta hCG blood, ED  Result Value Ref Range   I-stat hCG, quantitative <5.0 <5 mIU/mL   Comment 3           Laboratory interpretation all normal except concentrated urine,  leukocytosis    EKG None  Radiology Ct Abdomen Pelvis W Contrast  Result Date: 08/12/2018 CLINICAL DATA:  Abdominal pain since 2 p.m. today. Previous weight loss surgery. History of constipation. EXAM: CT ABDOMEN AND PELVIS WITH CONTRAST TECHNIQUE: Multidetector CT imaging of the abdomen and pelvis was performed using the standard protocol following bolus administration of intravenous contrast. CONTRAST:  180mL OMNIPAQUE IOHEXOL 300 MG/ML  SOLN COMPARISON:  None. FINDINGS: Lower chest: Lung bases are clear. Hepatobiliary: No focal liver abnormality is seen. No gallstones, gallbladder wall thickening, or biliary dilatation. Pancreas: Unremarkable. No pancreatic ductal dilatation or surrounding inflammatory changes. Spleen: Normal in size without focal abnormality. Adrenals/Urinary Tract: Adrenal glands are unremarkable. Kidneys are normal, without renal calculi, focal lesion, or hydronephrosis. Bladder is unremarkable. Stomach/Bowel: Stomach, small bowel, and colon are not abnormally distended. Postoperative changes in the stomach consistent with gastric sleeve procedure. Mild wall thickening involving the small bowel, most prominent in the pelvis and right lower quadrant but probably fairly diffuse. There is edema in the mesentery and a small amount of free fluid in the pelvis. Changes likely to represent enteritis or possibly Crohn disease. Colon is stool filled without abnormal distention. Appendix is normal. Vascular/Lymphatic: Aortic atherosclerosis. No enlarged abdominal or pelvic lymph nodes. Reproductive: Uterus and bilateral adnexa are unremarkable. Other: No free air in the abdomen. Small periumbilical hernia containing fat. Focus of fat necrosis in the omentum. Musculoskeletal: No acute or significant osseous findings. IMPRESSION: 1. Postoperative changes consistent with gastric sleeve procedure. 2. Mild small bowel wall thickening and mesenteric edema with small amount of free fluid in the  pelvis. Changes likely to represent enteritis or possibly Crohn disease. 3. Focal area of fat necrosis in the anterior omentum, likely associated with a small periumbilical hernia which contains fat. Aortic Atherosclerosis (ICD10-I70.0). Electronically Signed   By: Lucienne Capers M.D.   On: 08/12/2018 02:31   Dg Abd 2 Views  Result Date: 08/12/2018 CLINICAL DATA:  Abdomen pain with bloating EXAM: ABDOMEN - 2 VIEW COMPARISON:  05/05/2015 FINDINGS: Lung bases are clear. No free air beneath the diaphragm. Postsurgical changes in the left upper quadrant. Borderline gaseous dilatation of central small bowel. Scattered colon gas is present. Fluid levels on the upright exam. IMPRESSION: Overall nonobstructed gas pattern with borderline air distended central small bowel but with colon gas present. Electronically Signed   By: Donavan Foil M.D.   On: 08/12/2018 01:22    Procedures Procedures (including critical care time)  Medications Ordered in ED Medications  metroNIDAZOLE (  FLAGYL) tablet 500 mg (500 mg Oral Given 08/12/18 0329)  HYDROcodone-acetaminophen (NORCO/VICODIN) 5-325 MG per tablet 1 tablet (has no administration in time range)  sodium chloride 0.9 % bolus 1,000 mL (0 mLs Intravenous Stopped 08/12/18 0434)  sodium chloride 0.9 % bolus 500 mL (0 mLs Intravenous Stopped 08/12/18 0325)  famotidine (PEPCID) IVPB 20 mg premix (0 mg Intravenous Stopped 08/12/18 0230)  ondansetron (ZOFRAN) injection 4 mg (4 mg Intravenous Given 08/12/18 0140)  iohexol (OMNIPAQUE) 300 MG/ML solution 100 mL (100 mLs Intravenous Contrast Given 08/12/18 0207)  fentaNYL (SUBLIMAZE) injection 50 mcg (50 mcg Intravenous Given 08/12/18 0328)     Initial Impression / Assessment and Plan / ED Course  I have reviewed the triage vital signs and the nursing notes.  Pertinent labs & imaging results that were available during my care of the patient were reviewed by me and considered in my medical decision making (see chart for  details).        We discussed her test results, there was no indication to suggest possible gallbladder issue, her LFTs and her lipase are normal.  However she does have a mildly elevated white blood cell count.  I looked at her x-rays of her abdomen and she had a lot of stool in her right and left colon.  She did have some increased small bowel air pattern, I talked to the patient depending on what the radiologist said we may need to proceed with a CT scan.  After reviewing the radiologist reading patient had IV inserted and was given IV fluids for her dehydration and CT done to further evaluate her abdominal pain.  She was given IV Pepcid.  Recheck 4 AM patient is doing about the same.  We discussed her test results.  I gave her the option of being admitted for IV fluids and GI evaluation however she would prefer doing it as an outpatient.  Patient has several drug allergies that we would normally use for inflammation that could possibly be infectious of the intestines.  I need to research and see what I can give her.   5:28 AM patient discussed with the pharmacist at [facility], we have decided just to put her on metronidazole due to her other drug allergies.  Final Clinical Impressions(s) / ED Diagnoses   Final diagnoses:  Inflammation of small intestine  Epigastric pain    ED Discharge Orders         Ordered    metroNIDAZOLE (FLAGYL) 500 MG tablet  3 times daily     08/12/18 0536    omeprazole (PRILOSEC) 20 MG capsule     08/12/18 0536    ondansetron (ZOFRAN) 4 MG tablet  Every 8 hours PRN     08/12/18 0536         Plan discharge  Rolland Porter, MD, Barbette Or, MD 08/12/18 [phone]

## 2018-08-12 NOTE — Telephone Encounter (Signed)
Can we accept her as a new patient?

## 2018-08-12 NOTE — ED Notes (Signed)
Patient transported to CT 

## 2018-08-12 NOTE — Discharge Instructions (Addendum)
Clear liquid diet the next 24 hours, if you doing well you can advance to a bland diet.  Take the medications as prescribed.  Please call Dr. Oneida Alar office, the gastroenterologist on call, to get a appointment to evaluate your abdominal pain if it is not improving.  This however may not happen this week.  Return to the emergency department if your pain is getting worse or if you are not improving have your primary care doctor recheck you.

## 2018-08-12 NOTE — Telephone Encounter (Signed)
She has been established in past with [facility]. Last procedure in 2015 by Dr. Fuller Plan. Will need to continue care there.

## 2018-10-21 ENCOUNTER — Other Ambulatory Visit (HOSPITAL_COMMUNITY): Payer: Self-pay | Admitting: Pulmonary Disease

## 2018-10-21 DIAGNOSIS — Z1231 Encounter for screening mammogram for malignant neoplasm of breast: Secondary | ICD-10-CM

## 2018-12-29 ENCOUNTER — Encounter (HOSPITAL_COMMUNITY): Payer: Self-pay

## 2018-12-29 ENCOUNTER — Other Ambulatory Visit: Payer: Self-pay

## 2018-12-29 ENCOUNTER — Ambulatory Visit (HOSPITAL_COMMUNITY)
Admission: RE | Admit: 2018-12-29 | Discharge: 2018-12-29 | Disposition: A | Discharge status: Home / Self Care | Payer: 59 | Source: Ambulatory Visit | Attending: Pulmonary Disease | Admitting: Pulmonary Disease

## 2018-12-29 DIAGNOSIS — Z1231 Encounter for screening mammogram for malignant neoplasm of breast: Secondary | ICD-10-CM | POA: Diagnosis not present

## 2019-01-05 ENCOUNTER — Telehealth: Payer: Self-pay

## 2019-01-05 ENCOUNTER — Encounter: Payer: Self-pay | Admitting: Gastroenterology

## 2019-01-05 ENCOUNTER — Other Ambulatory Visit (INDEPENDENT_AMBULATORY_CARE_PROVIDER_SITE_OTHER): Payer: 59

## 2019-01-05 ENCOUNTER — Ambulatory Visit (INDEPENDENT_AMBULATORY_CARE_PROVIDER_SITE_OTHER): Payer: 59 | Admitting: Gastroenterology

## 2019-01-05 VITALS — BP 106/70 | HR 64 | Temp 98.0°F | Ht 66.0 in | Wt 210.0 lb

## 2019-01-05 DIAGNOSIS — K5900 Constipation, unspecified: Secondary | ICD-10-CM | POA: Diagnosis not present

## 2019-01-05 DIAGNOSIS — R109 Unspecified abdominal pain: Secondary | ICD-10-CM

## 2019-01-05 LAB — CBC WITH DIFFERENTIAL/PLATELET
Basophils Absolute: 0 10*3/uL (ref 0.0–0.1)
Basophils Relative: 0.5 % (ref 0.0–3.0)
Eosinophils Absolute: 0.2 10*3/uL (ref 0.0–0.7)
Eosinophils Relative: 2.1 % (ref 0.0–5.0)
HCT: 42.7 % (ref 36.0–46.0)
Hemoglobin: 14.1 g/dL (ref 12.0–15.0)
Lymphocytes Relative: 30.7 % (ref 12.0–46.0)
Lymphs Abs: 3 10*3/uL (ref 0.7–4.0)
MCHC: 33.1 g/dL (ref 30.0–36.0)
MCV: 88.6 fl (ref 78.0–100.0)
Monocytes Absolute: 0.4 10*3/uL (ref 0.1–1.0)
Monocytes Relative: 4.1 % (ref 3.0–12.0)
Neutro Abs: 6 10*3/uL (ref 1.4–7.7)
Neutrophils Relative %: 62.6 % (ref 43.0–77.0)
Platelets: 259 10*3/uL (ref 150.0–400.0)
RBC: 4.82 Mil/uL (ref 3.87–5.11)
RDW: 13.3 % (ref 11.5–15.5)
WBC: 9.6 10*3/uL (ref 4.0–10.5)

## 2019-01-05 LAB — TSH: TSH: 2.07 u[IU]/mL (ref 0.35–4.50)

## 2019-01-05 LAB — SEDIMENTATION RATE: Sed Rate: 38 mm/hr — ABNORMAL HIGH (ref 0–30)

## 2019-01-05 NOTE — Progress Notes (Signed)
Review of pertinent gastrointestinal problems: 1.  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  Multiple food impactions at the site of her lap band 2012 through 2015 or so.  Numerous food impaction events led to upper endoscopies by a variety of gastroenterologist in my group nearly all of them showed that the food was impacted in her proximal stomach, below the GE junction, at the site of her lap band device.  She was recommended very strongly to have this addressed by bariatric surgeon.  December 2016 transition from lap band to gastric sleeve in [place] and this significantly helped her dysphasia issues.  She has had no swallowing trouble since then.   HPI: This is a  Very pleasant  Woman   She was last here a little over 5 or 6 years ago.  We have not heard from her since.  She is here today with a new problem compared to her previous dysphasia problems  Chief complaint is  Constipation.  In March 2020 she was in the emergency room with abdominal pain and underwent testing.  Her white blood cell count was 12,000 and a complete metabolic profile was normal.  CT scan showed postoperative gastric sleeve anatomy.  Also "mild small bowel wall thickening and mesenteric edema with small amount of free fluid in the pelvis.  Changes likely to represent enteritis or possibly Crohn's disease.  Focal area of fat necrosis in the anterior omentum, likely associated with small periumbilical hernia which contains fat."  The abdominal pains which sent her to the emergency room back in March resolved within a day or 2.  She has not had those pains again.  In the past 6 months she has probably gained 20 pounds.  She sees no overt GI bleeding.  She has significant constipation and this is been a problem for many months probably many years.  She believes the constipation got worse when she had her sleeve procedure.  She takes a over-the-counter stool softener daily and despite that she will only move her bowels  every week or 2 after she takes a laxative.  She believes she has tried MiraLAX and also fiber pills in the past   Review of systems: Pertinent positive and negative review of systems were noted in the above HPI section. All other review negative.   Past Medical History:  Diagnosis Date  . Arthritis   . Asthma   . COPD (chronic obstructive pulmonary disease) ([place])   . Diabetes mellitus   . GERD (gastroesophageal reflux disease)   . Obesity   . Osteoporosis   . Vertigo     Past Surgical History:  Procedure Laterality Date  . BLADDER SURGERY    . ESOPHAGOGASTRODUODENOSCOPY  11/17/2011   Procedure: ESOPHAGOGASTRODUODENOSCOPY (EGD);  Surgeon: Milus Banister, MD;  Location: [place];  Service: Endoscopy;  Laterality: N/A;  . ESOPHAGOGASTRODUODENOSCOPY  01/02/2012   Procedure: ESOPHAGOGASTRODUODENOSCOPY (EGD);  Surgeon: Milus Banister, MD;  Location: [place];  Service: Endoscopy;  Laterality: N/A;  . ESOPHAGOGASTRODUODENOSCOPY N/A 01/22/2013   Procedure: ESOPHAGOGASTRODUODENOSCOPY (EGD);  Surgeon: Jerene Bears, MD;  Location: [place];  Service: Gastroenterology;  Laterality: N/A;  . ESOPHAGOGASTRODUODENOSCOPY N/A 10/22/2013   Procedure: ESOPHAGOGASTRODUODENOSCOPY (EGD);  Surgeon: Ladene Artist, MD;  Location: [facility] ENDOSCOPY;  Service: Endoscopy;  Laterality: N/A;  . HERNIA REPAIR    . LAPAROSCOPIC GASTRIC BAND REMOVAL WITH LAPAROSCOPIC GASTRIC SLEEVE RESECTION  2016  . LAPAROSCOPIC GASTRIC BANDING    . TUBAL LIGATION      Current  Outpatient Medications  Medication Sig Dispense Refill  . albuterol (PROVENTIL HFA;VENTOLIN HFA) 108 (90 BASE) MCG/ACT inhaler Inhale 2 puffs into the lungs every 6 (six) hours as needed for wheezing or shortness of breath.    . AMBULATORY NON FORMULARY MEDICATION Medication Name: tool softner + laxative 1 po daily    . diazepam (VALIUM) 5 MG tablet Take 1 tablet by mouth as needed.    Marland Kitchen glipiZIDE-metformin (METAGLIP) 5-500 MG per tablet Take  1 tablet by mouth at bedtime.     Marland Kitchen ipratropium-albuterol (DUONEB) 0.5-2.5 (3) MG/3ML SOLN Inhale 3 mLs into the lungs every 6 (six) hours as needed.     . Liraglutide (VICTOZA) 18 MG/3ML SOPN Inject 1.8 mg into the skin daily.     . meclizine (ANTIVERT) 25 MG tablet Take 1 tablet by mouth as needed.    . sertraline (ZOLOFT) 50 MG tablet Take 50 mg by mouth daily.    Marland Kitchen tiZANidine (ZANAFLEX) 2 MG tablet Take 2 mg by mouth every 6 (six) hours as needed for muscle spasms.    . Vitamin D, Ergocalciferol, (DRISDOL) 1.25 MG (50000 UT) CAPS capsule Take 1 capsule by mouth once a week.    . calcium carbonate (TUMS - DOSED IN MG ELEMENTAL CALCIUM) 500 MG chewable tablet Chew 2 tablets by mouth as needed.    . metFORMIN (GLUCOPHAGE-XR) 500 MG 24 hr tablet Take 2 tablets by mouth daily with breakfast.    . pantoprazole (PROTONIX) 40 MG tablet Take 1 tablet by mouth as needed.     No current facility-administered medications for this visit.     Allergies as of 01/05/2019 - Review Complete 01/05/2019  Allergen Reaction Noted  . Sulfa antibiotics Other (See Comments) 10/02/2010  . Cephalexin Itching and Rash 10/02/2010  . Ciprofloxacin hcl Itching and Rash 10/02/2010  . Penicillins Itching and Rash 10/02/2010    Family History  Problem Relation Age of Onset  . Breast cancer Mother   . Diabetes Mother   . COPD Mother   . Heart disease Father        and Sister  . Cirrhosis Father        Non-alcholic   . Diabetes Father   . Hypertension Father   . Kidney disease Father        chronic kidney stones  . Diabetes Sister   . Diabetes Brother   . Colon cancer Neg Hx     Social History   Socioeconomic History  . Marital status: Married    Spouse name: Not on file  . Number of children: 4  . Years of education: Not on file  . Highest education level: Not on file  Occupational History  . Occupation: Disabled     Fish farm manager: UNEMPLOYED  Social Needs  . Financial resource strain: Not on file   . Food insecurity    Worry: Not on file    Inability: Not on file  . Transportation needs    Medical: Not on file    Non-medical: Not on file  Tobacco Use  . Smoking status: Former Smoker    Quit date: 11/16/2005    Years since quitting: 13.1  . Smokeless tobacco: Never Used  Substance and Sexual Activity  . Alcohol use: Yes    Comment: occasion - once or twice a year  . Drug use: No  . Sexual activity: Yes    Birth control/protection: Surgical  Lifestyle  . Physical activity    Days per week: Not on  file    Minutes per session: Not on file  . Stress: Not on file  Relationships  . Social Herbalist on phone: Not on file    Gets together: Not on file    Attends religious service: Not on file    Active member of club or organization: Not on file    Attends meetings of clubs or organizations: Not on file    Relationship status: Not on file  . Intimate partner violence    Fear of current or ex partner: Not on file    Emotionally abused: Not on file    Physically abused: Not on file    Forced sexual activity: Not on file  Other Topics Concern  . Not on file  Social History Narrative   2 caffeine drinks daily      Physical Exam: BP 106/70   Pulse 64   Temp 98 F (36.7 C)   Ht 5\' 6"  (1.676 m)   Wt 210 lb (95.3 kg)   LMP 07/30/2010   BMI 33.89 kg/m  Constitutional: generally well-appearing Psychiatric: alert and oriented x3 Eyes: extraocular movements intact Mouth: oral pharynx moist, no lesions Neck: supple no lymphadenopathy Cardiovascular: heart regular rate and rhythm Lungs: clear to auscultation bilaterally Abdomen: soft, nontender, nondistended, no obvious ascites, no peritoneal signs, normal bowel sounds Extremities: no lower extremity edema bilaterally Skin: no lesions on visible extremities   Assessment and plan: 53 y.o. female with significant constipation  First we will reach out to her [facility] gastroenterologist to get the report  from her 2016 colonoscopy.  She was told that this was normal and that she did not need a repeat for 10 years.  She will get a basic set of labs today including a CBC, complete metabolic profile, thyroid testing and sedimentation rate.  I think the small bowel thickening that was noticed by CT scan 7 months ago was unlikely anything serious, possibly some mild enteritis.  Since then she has gained 20 pounds and has been very bothered by constipation.  Seems very unlikely that this really represents a malignancy or chronic inflammation.  I do not think she needs any follow-up for that CT scan  Instead we are going to instruct her on MiraLAX Gatorade purge followed by daily 1 dose of MiraLAX and daily 1 dose of Citrucel fiber supplement and then she will return to see me in 2 months to see how she is responding.    Please see the "Patient Instructions" section for addition details about the plan.   Owens Loffler, MD [facility] 01/05/2019, 3:14 PM  Cc: Sinda Du, MD

## 2019-01-05 NOTE — Patient Instructions (Addendum)
Lab tests today including CBC, complete metabolic profile, TSH and sedimentation rate.  We will instruct her on MiraLAX/Gatorade purge which she will do in the next couple days when she is ready to do it.   Following the purge above she will start a single dose of MiraLAX every single day and also a single heaping spoonful of Citrucel fiber supplement every single day.   She will return to see me in the office in 2 to 3 months, in person   We will reach out to her [facility] gastroenterologist for her 2016 colonoscopy report  Dr Ardis Hughs recommends that you complete a bowel purge (to clean out your bowels). Please do the following: Purchase a bottle of Miralax over the counter as well as a box of 5 mg dulcolax tablets. Take 4 dulcolax tablets. Wait 1 hour. You will then drink 6-8 capfuls of Miralax mixed in an adequate amount of water/juice/gatorade (you may choose which of these liquids to drink) over the next 2-3 hours. You should expect results within 1 to 6 hours after completing the bowel purge.  Thank you for entrusting me with your care and choosing [facility].  Dr Ardis Hughs

## 2019-01-06 ENCOUNTER — Telehealth: Payer: Self-pay

## 2019-03-02 ENCOUNTER — Other Ambulatory Visit: Payer: Self-pay | Admitting: Gastroenterology

## 2019-03-02 DIAGNOSIS — R109 Unspecified abdominal pain: Secondary | ICD-10-CM

## 2019-03-27 ENCOUNTER — Other Ambulatory Visit: Payer: Self-pay | Admitting: *Deleted

## 2019-03-27 DIAGNOSIS — Z20822 Contact with and (suspected) exposure to covid-19: Secondary | ICD-10-CM

## 2019-03-29 LAB — NOVEL CORONAVIRUS, NAA: SARS-CoV-2, NAA: DETECTED — AB

## 2019-04-06 ENCOUNTER — Ambulatory Visit: Payer: 59 | Admitting: Gastroenterology

## 2019-10-03 ENCOUNTER — Emergency Department (HOSPITAL_COMMUNITY): Payer: 59

## 2019-10-03 ENCOUNTER — Encounter (HOSPITAL_COMMUNITY): Payer: Self-pay | Admitting: *Deleted

## 2019-10-03 ENCOUNTER — Other Ambulatory Visit: Payer: Self-pay

## 2019-10-03 DIAGNOSIS — E119 Type 2 diabetes mellitus without complications: Secondary | ICD-10-CM | POA: Insufficient documentation

## 2019-10-03 DIAGNOSIS — R1011 Right upper quadrant pain: Secondary | ICD-10-CM | POA: Diagnosis not present

## 2019-10-03 DIAGNOSIS — R112 Nausea with vomiting, unspecified: Secondary | ICD-10-CM | POA: Diagnosis not present

## 2019-10-03 DIAGNOSIS — R0789 Other chest pain: Secondary | ICD-10-CM | POA: Diagnosis present

## 2019-10-03 DIAGNOSIS — Z7984 Long term (current) use of oral hypoglycemic drugs: Secondary | ICD-10-CM | POA: Diagnosis not present

## 2019-10-03 DIAGNOSIS — Z9884 Bariatric surgery status: Secondary | ICD-10-CM | POA: Insufficient documentation

## 2019-10-03 DIAGNOSIS — Z87891 Personal history of nicotine dependence: Secondary | ICD-10-CM | POA: Insufficient documentation

## 2019-10-03 DIAGNOSIS — J449 Chronic obstructive pulmonary disease, unspecified: Secondary | ICD-10-CM | POA: Diagnosis not present

## 2019-10-03 MED ORDER — SODIUM CHLORIDE 0.9% FLUSH
3.0000 mL | Freq: Once | INTRAVENOUS | Status: AC
  Administered 2019-10-04: 3 mL via INTRAVENOUS

## 2019-10-03 NOTE — ED Triage Notes (Signed)
Pt began to have epigastric and mid cp after eating dinner this evening. Pt emesis x1.

## 2019-10-04 ENCOUNTER — Emergency Department (HOSPITAL_COMMUNITY): Payer: 59

## 2019-10-04 ENCOUNTER — Emergency Department (HOSPITAL_COMMUNITY)
Admission: EM | Admit: 2019-10-04 | Discharge: 2019-10-04 | Disposition: A | Discharge status: Home / Self Care | Payer: 59 | Attending: Emergency Medicine | Admitting: Emergency Medicine

## 2019-10-04 ENCOUNTER — Other Ambulatory Visit: Payer: Self-pay

## 2019-10-04 DIAGNOSIS — R1011 Right upper quadrant pain: Secondary | ICD-10-CM | POA: Diagnosis not present

## 2019-10-04 LAB — HEPATIC FUNCTION PANEL
ALT: 26 U/L (ref 0–44)
AST: 24 U/L (ref 15–41)
Albumin: 4.2 g/dL (ref 3.5–5.0)
Alkaline Phosphatase: 113 U/L (ref 38–126)
Bilirubin, Direct: 0.1 mg/dL (ref 0.0–0.2)
Indirect Bilirubin: 0.2 mg/dL — ABNORMAL LOW (ref 0.3–0.9)
Total Bilirubin: 0.3 mg/dL (ref 0.3–1.2)
Total Protein: 7.9 g/dL (ref 6.5–8.1)

## 2019-10-04 LAB — TROPONIN I (HIGH SENSITIVITY)
Troponin I (High Sensitivity): 3 ng/L (ref <18)
Troponin I (High Sensitivity): 3 ng/L (ref <18)

## 2019-10-04 LAB — BASIC METABOLIC PANEL
Anion gap: 12 (ref 5–15)
BUN: 14 mg/dL (ref 6–20)
CO2: 23 mmol/L (ref 22–32)
Calcium: 9.6 mg/dL (ref 8.9–10.3)
Chloride: 102 mmol/L (ref 98–111)
Creatinine, Ser: 0.79 mg/dL (ref 0.44–1.00)
GFR calc Af Amer: >60 mL/min (ref >60)
GFR calc non Af Amer: >60 mL/min (ref >60)
Glucose, Bld: 245 mg/dL — ABNORMAL HIGH (ref 70–99)
Potassium: 4.5 mmol/L (ref 3.5–5.1)
Sodium: 137 mmol/L (ref 135–145)

## 2019-10-04 LAB — DIFFERENTIAL
Abs Immature Granulocytes: 0.04 10*3/uL (ref 0.00–0.07)
Basophils Absolute: 0 10*3/uL (ref 0.0–0.1)
Basophils Relative: 0 %
Eosinophils Absolute: 0.1 10*3/uL (ref 0.0–0.5)
Eosinophils Relative: 1 %
Immature Granulocytes: 0 %
Lymphocytes Relative: 15 %
Lymphs Abs: 1.9 10*3/uL (ref 0.7–4.0)
Monocytes Absolute: 0.5 10*3/uL (ref 0.1–1.0)
Monocytes Relative: 4 %
Neutro Abs: 10.3 10*3/uL — ABNORMAL HIGH (ref 1.7–7.7)
Neutrophils Relative %: 80 %

## 2019-10-04 LAB — CBC
HCT: 44 % (ref 36.0–46.0)
Hemoglobin: 14.2 g/dL (ref 12.0–15.0)
MCH: 29.4 pg (ref 26.0–34.0)
MCHC: 32.3 g/dL (ref 30.0–36.0)
MCV: 91.1 fL (ref 80.0–100.0)
Platelets: 262 10*3/uL (ref 150–400)
RBC: 4.83 MIL/uL (ref 3.87–5.11)
RDW: 12.6 % (ref 11.5–15.5)
WBC: 12.7 10*3/uL — ABNORMAL HIGH (ref 4.0–10.5)
nRBC: 0 % (ref 0.0–0.2)

## 2019-10-04 LAB — URINALYSIS, ROUTINE W REFLEX MICROSCOPIC
Bilirubin Urine: NEGATIVE
Glucose, UA: 250 mg/dL — AB
Hgb urine dipstick: NEGATIVE
Ketones, ur: NEGATIVE mg/dL
Leukocytes,Ua: NEGATIVE
Nitrite: NEGATIVE
Protein, ur: NEGATIVE mg/dL
Specific Gravity, Urine: 1.01 (ref 1.005–1.030)
pH: 5 (ref 5.0–8.0)

## 2019-10-04 LAB — LIPASE, BLOOD: Lipase: 39 U/L (ref 11–51)

## 2019-10-04 MED ORDER — MORPHINE SULFATE (PF) 4 MG/ML IV SOLN
4.0000 mg | Freq: Once | INTRAVENOUS | Status: AC
  Administered 2019-10-04: 4 mg via INTRAVENOUS
  Filled 2019-10-04: qty 1

## 2019-10-04 MED ORDER — ONDANSETRON HCL 4 MG/2ML IJ SOLN
4.0000 mg | Freq: Once | INTRAMUSCULAR | Status: AC
  Administered 2019-10-04: 01:00:00 4 mg via INTRAVENOUS
  Filled 2019-10-04: qty 2

## 2019-10-04 MED ORDER — HYDROCODONE-ACETAMINOPHEN 5-325 MG PO TABS: 1.0000 | ORAL_TABLET | Freq: Four times a day (QID) | ORAL | 0 refills | Status: DC | PRN

## 2019-10-04 MED ORDER — IOHEXOL 300 MG/ML  SOLN
100.0000 mL | Freq: Once | INTRAMUSCULAR | Status: AC | PRN
  Administered 2019-10-04: 100 mL via INTRAVENOUS

## 2019-10-04 MED ORDER — SODIUM CHLORIDE 0.9 % IV BOLUS
1000.0000 mL | Freq: Once | INTRAVENOUS | Status: AC
  Administered 2019-10-04: 1000 mL via INTRAVENOUS

## 2019-10-04 NOTE — Discharge Instructions (Addendum)
Follow-up with gastroenterology also follow-up with general surgery.  Recommending outpatient HIDA scan to further evaluate the gallbladder.  But ultrasound of the gallbladder and CT scan of the abdomen without any acute or significant abnormalities other than distention of the gallbladder.  Return for any new or worse symptoms.  Take the pain medication as directed.

## 2019-10-04 NOTE — ED Provider Notes (Signed)
[facility] EMERGENCY DEPARTMENT Provider Note   CSN: [id number] Arrival date & time: 10/03/19  2201   History Chief Complaint  Patient presents with  . Chest Pain    Terri [name] is a 54 y.o. female.  The history is provided by the patient.  Chest Pain She has history of diabetes, COPD, bariatric surgery and comes in because of right upper quadrant pain which started at 7 PM.  Pain came on suddenly.  There is some radiation of the epigastric area into the back.  There is associated nausea and vomiting.  Pain is currently rated at 8/10 although it does increase to 10/10 on occasions.  It feels a little bit better when she walks around and slightly worse when she takes a deep breath.  She had eaten a hot dog at noon, and eaten a few grapes through the afternoon.  She has never had pain like this before.  Past Medical History:  Diagnosis Date  . Arthritis   . Asthma   . COPD (chronic obstructive pulmonary disease) ([place])   . Diabetes mellitus   . GERD (gastroesophageal reflux disease)   . Obesity   . Osteoporosis   . Vertigo     Patient Active Problem List   Diagnosis Date Noted  . Chest pain 08/19/2012  . COPD exacerbation ([place]) 08/19/2012  . Diabetes ([place]) 08/19/2012  . Lapband in [facility] 2009 02/13/2012  . Nonspecific (abnormal) findings on radiological and other examination of gastrointestinal tract 01/02/2012  . Foreign body in stomach 01/02/2012  . Dysphagia 11/17/2011    Past Surgical History:  Procedure Laterality Date  . BLADDER SURGERY    . ESOPHAGOGASTRODUODENOSCOPY  11/17/2011   Procedure: ESOPHAGOGASTRODUODENOSCOPY (EGD);  Surgeon: Milus Banister, MD;  Location: [place];  Service: Endoscopy;  Laterality: N/A;  . ESOPHAGOGASTRODUODENOSCOPY  01/02/2012   Procedure: ESOPHAGOGASTRODUODENOSCOPY (EGD);  Surgeon: Milus Banister, MD;  Location: [place];  Service: Endoscopy;  Laterality: N/A;  . ESOPHAGOGASTRODUODENOSCOPY N/A 01/22/2013   Procedure:  ESOPHAGOGASTRODUODENOSCOPY (EGD);  Surgeon: Jerene Bears, MD;  Location: [place];  Service: Gastroenterology;  Laterality: N/A;  . ESOPHAGOGASTRODUODENOSCOPY N/A 10/22/2013   Procedure: ESOPHAGOGASTRODUODENOSCOPY (EGD);  Surgeon: Ladene Artist, MD;  Location: [facility] ENDOSCOPY;  Service: Endoscopy;  Laterality: N/A;  . HERNIA REPAIR    . LAPAROSCOPIC GASTRIC BAND REMOVAL WITH LAPAROSCOPIC GASTRIC SLEEVE RESECTION  2016  . LAPAROSCOPIC GASTRIC BANDING    . TUBAL LIGATION       OB History   No obstetric history on file.     Family History  Problem Relation Age of Onset  . Breast cancer Mother   . Diabetes Mother   . COPD Mother   . Heart disease Father        and Sister  . Cirrhosis Father        Non-alcholic   . Diabetes Father   . Hypertension Father   . Kidney disease Father        chronic kidney stones  . Diabetes Sister   . Diabetes Brother   . Colon cancer Neg Hx     Social History   Tobacco Use  . Smoking status: Former Smoker    Quit date: 11/16/2005    Years since quitting: 13.8  . Smokeless tobacco: Never Used  Substance Use Topics  . Alcohol use: Yes    Comment: occasion - once or twice a year  . Drug use: No    Home Medications Prior to Admission medications  Medication Sig Start Date End Date Taking? Authorizing Provider  albuterol (PROVENTIL HFA;VENTOLIN HFA) 108 (90 BASE) MCG/ACT inhaler Inhale 2 puffs into the lungs every 6 (six) hours as needed for wheezing or shortness of breath.    [provider]  AMBULATORY NON FORMULARY MEDICATION Medication Name: tool softner + laxative 1 po daily    [provider]  calcium carbonate (TUMS - DOSED IN MG ELEMENTAL CALCIUM) 500 MG chewable tablet Chew 2 tablets by mouth as needed.    [provider]  diazepam (VALIUM) 5 MG tablet Take 1 tablet by mouth as needed. 08/16/14   [provider]  glipiZIDE-metformin (METAGLIP) 5-500 MG per tablet Take 1 tablet by mouth at bedtime.      [provider]  ipratropium-albuterol (DUONEB) 0.5-2.5 (3) MG/3ML SOLN Inhale 3 mLs into the lungs every 6 (six) hours as needed.  09/10/12   [provider]  Liraglutide (VICTOZA) 18 MG/3ML SOPN Inject 1.8 mg into the skin daily.     [provider]  meclizine (ANTIVERT) 25 MG tablet Take 1 tablet by mouth as needed. 08/16/14   [provider]  metFORMIN (GLUCOPHAGE-XR) 500 MG 24 hr tablet Take 2 tablets by mouth daily with breakfast.    [provider]  pantoprazole (PROTONIX) 40 MG tablet Take 1 tablet by mouth as needed.    [provider]  sertraline (ZOLOFT) 50 MG tablet Take 50 mg by mouth daily.    [provider]  tiZANidine (ZANAFLEX) 2 MG tablet Take 2 mg by mouth every 6 (six) hours as needed for muscle spasms.    [provider]  Vitamin D, Ergocalciferol, (DRISDOL) 1.25 MG (50000 UT) CAPS capsule Take 1 capsule by mouth once a week. 12/31/18   [provider]    Allergies    Sulfa antibiotics, Cephalexin, Ciprofloxacin hcl, and Penicillins  Review of Systems   Review of Systems  Cardiovascular: Positive for chest pain.  All other systems reviewed and are negative.   Physical Exam Updated Vital Signs BP (!) 147/58 (BP Location: Right Arm)   Pulse 64   Temp (!) 97.4 F (36.3 C) (Oral)   Resp (!) 24   Ht 5\' 7"  (1.702 m)   Wt 98.4 kg   LMP 07/30/2010   SpO2 100%   BMI 33.99 kg/m   Physical Exam Vitals and nursing note reviewed.   54 year old female, appears uncomfortable, but is in no acute distress. Vital signs are significant for slightly low heart rate. Oxygen saturation is 98%, which is normal. Head is normocephalic and atraumatic. PERRLA, EOMI. Oropharynx is clear. Neck is nontender and supple without adenopathy or JVD. Back is nontender and there is no CVA tenderness. Lungs are clear without rales, wheezes, or rhonchi. Chest is nontender. Heart has regular rate and rhythm without  murmur. Abdomen is soft, flat, with moderate epigastric tenderness and marked right upper quadrant tenderness.  There is a positive Murphy sign.  There is no rebound or guarding.  There are no masses or hepatosplenomegaly and peristalsis is hypoactive. Extremities have no cyanosis or edema, full range of motion is present. Skin is warm and dry without rash. Neurologic: Mental status is normal, cranial nerves are intact, there are no motor or sensory deficits.  ED Results / Procedures / Treatments   Labs (all labs ordered are listed, but only abnormal results are displayed) Labs Reviewed  CBC - Abnormal; Notable for the following components:      Result Value  WBC 12.7 (*)    All other components within normal limits  BASIC METABOLIC PANEL  HEPATIC FUNCTION PANEL  LIPASE, BLOOD  DIFFERENTIAL  TROPONIN I (HIGH SENSITIVITY)  TROPONIN I (HIGH SENSITIVITY)    EKG EKG Interpretation  Date/Time:  Saturday Oct 03 2019 22:17:13 EDT Ventricular Rate:  65 PR Interval:  132 QRS Duration: 70 QT Interval:  396 QTC Calculation: 411 R Axis:   -19 Text Interpretation: Normal sinus rhythm Low voltage QRS Borderline ECG When compared with ECG of 08/19/2012, No significant change was found Reconfirmed by Delora Fuel ([id number]) on 10/03/2019 11:16:30 PM   Radiology CT ABDOMEN PELVIS W CONTRAST  Result Date: 10/04/2019 CLINICAL DATA:  Epigastric and mid chest pain after eating with vomiting. EXAM: CT ABDOMEN AND PELVIS WITH CONTRAST TECHNIQUE: Multidetector CT imaging of the abdomen and pelvis was performed using the standard protocol following bolus administration of intravenous contrast. CONTRAST:  153mL OMNIPAQUE IOHEXOL 300 MG/ML  SOLN COMPARISON:  August 12, 2018 FINDINGS: Lower chest: No acute abnormality. Hepatobiliary: No focal liver abnormality is seen. No gallstones, gallbladder wall thickening, or biliary dilatation. Pancreas: Unremarkable. No pancreatic ductal dilatation or surrounding  inflammatory changes. Spleen: Normal in size without focal abnormality. Adrenals/Urinary Tract: Adrenal glands are unremarkable. Kidneys are normal, without renal calculi, focal lesion, or hydronephrosis. Bladder is unremarkable. Stomach/Bowel: Ill-defined surgical sutures are seen along the gastric region. Appendix appears normal. No evidence of bowel wall thickening, distention, or inflammatory changes. Vascular/Lymphatic: Mild aortic calcification. No enlarged abdominal or pelvic lymph nodes. Reproductive: Uterus and bilateral adnexa are unremarkable. Other: A small, stable focus of inflammatory fat stranding is seen along the midline of the anterior abdomen, at the level of the umbilicus. No abdominopelvic ascites. Musculoskeletal: No acute or significant osseous findings. IMPRESSION: 1. Findings consistent with history of prior gastric surgery. 2. Small, stable area of fat necrosis within the anterior omentum. Aortic Atherosclerosis (ICD10-I70.0). Electronically Signed   By: Virgina Norfolk M.D.   On: 10/04/2019 02:38   DG Chest Portable 1 View  Result Date: 10/03/2019 CLINICAL DATA:  Chest pain.  Epigastric pain. EXAM: PORTABLE CHEST 1 VIEW COMPARISON:  Chest x-ray dated June 08, 2016. FINDINGS: The heart size and mediastinal contours are within normal limits. Both lungs are clear. The visualized skeletal structures are unremarkable. There are atherosclerotic changes of the thoracic aorta. IMPRESSION: No active disease. Electronically Signed   By: Constance Holster M.D.   On: 10/03/2019 23:54    Procedures Procedures   Medications Ordered in ED Medications  sodium chloride flush (NS) 0.9 % injection 3 mL (has no administration in time range)    ED Course  I have reviewed the triage vital signs and the nursing notes.  Pertinent labs & imaging results that were available during my care of the patient were reviewed by me and considered in my medical decision making (see chart for details).   MDM Rules/Calculators/A&P Epigastric and right upper quadrant pain concerning for biliary colic.  Also consider pancreatitis, peptic ulcer disease, diverticulitis.  Consider possible internal hernia related to her bariatric surgery.  With degree of localized tenderness, doubt cardiac etiology.  ECG shows no acute changes and chest x-ray is unremarkable.  She will be given IV fluids, morphine, ondansetron and will send for CT of abdomen and pelvis.  Old records were reviewed, and she did have a CT of abdomen and pelvis in March 2020 which showed no gallstones.  Labs are significant for mild leukocytosis.  ECG shows no acute changes.  Chest x-ray was unremarkable.  CT of abdomen and pelvis shows no acute pathology.  She has required additional analgesics.  She will be held in the emergency department and sent for right upper quadrant ultrasound.  Case is signed out to Dr. Rogene Houston.  Final Clinical Impression(s) / ED Diagnoses Final diagnoses:  RUQ abdominal pain    Rx / DC Orders ED Discharge Orders    None       Delora Fuel, MD [id number] [phone]

## 2019-10-04 NOTE — ED Notes (Signed)
Patient resting. Patient waiting on ultrasound.

## 2019-10-04 NOTE — ED Provider Notes (Signed)
Patient's ultrasound gallbladder no significant findings.  Did have some distention of the gallbladder but no thickening no sludge no gallstones.  Patient is now pain-free no tenderness in the right upper quadrant.  Had a mild leukocytosis liver function tests were normal.  Think patient stable for discharge home with some pain medicine follow-up with general surgery as well as gastroenterology for outpatient HIDA scan.   Fredia Sorrow, MD 10/04/19 [phone]

## 2019-10-04 NOTE — ED Notes (Signed)
Patient taken to CT scanner

## 2019-10-08 ENCOUNTER — Other Ambulatory Visit: Payer: Self-pay

## 2019-10-08 ENCOUNTER — Encounter: Payer: Self-pay | Admitting: General Surgery

## 2019-10-08 ENCOUNTER — Ambulatory Visit (INDEPENDENT_AMBULATORY_CARE_PROVIDER_SITE_OTHER): Payer: 59 | Admitting: General Surgery

## 2019-10-08 VITALS — BP 133/80 | HR 73 | Temp 97.7°F | Resp 12 | Ht 67.0 in | Wt 220.0 lb

## 2019-10-08 DIAGNOSIS — R1011 Right upper quadrant pain: Secondary | ICD-10-CM | POA: Diagnosis not present

## 2019-10-08 NOTE — Progress Notes (Signed)
Terri [name]; [id number]; May 20, 1966   HPI Patient is a 54 year old white female who was referred to my care from the emergency room for evaluation treatment of right upper quadrant abdominal pain.  She states she has had 2 episodes of intense right upper quadrant abdominal pain.  It was not associated with any food.  She was seen in the emergency room and an ultrasound of her gallbladder was negative.  Her liver enzyme tests were negative.  It was suggested to her that she may need a HIDA scan.  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  She has felt a little gassy and burps.  She denies any fever, chills, or jaundice.  She does have an appointment to see a gastroenterologist in [place] in approximately 2 weeks.  She currently has 0 out of 10 abdominal pain. Past Medical History:  Diagnosis Date  . Arthritis   . Asthma   . COPD (chronic obstructive pulmonary disease) ([place])   . Diabetes mellitus   . GERD (gastroesophageal reflux disease)   . Obesity   . Osteoporosis   . Vertigo     Past Surgical History:  Procedure Laterality Date  . BLADDER SURGERY    . ESOPHAGOGASTRODUODENOSCOPY  11/17/2011   Procedure: ESOPHAGOGASTRODUODENOSCOPY (EGD);  Surgeon: Milus Banister, MD;  Location: [facility];  Service: Endoscopy;  Laterality: N/A;  . ESOPHAGOGASTRODUODENOSCOPY  01/02/2012   Procedure: ESOPHAGOGASTRODUODENOSCOPY (EGD);  Surgeon: Milus Banister, MD;  Location: [facility];  Service: Endoscopy;  Laterality: N/A;  . ESOPHAGOGASTRODUODENOSCOPY N/A 01/22/2013   Procedure: ESOPHAGOGASTRODUODENOSCOPY (EGD);  Surgeon: Jerene Bears, MD;  Location: [facility];  Service: Gastroenterology;  Laterality: N/A;  . ESOPHAGOGASTRODUODENOSCOPY N/A 10/22/2013   Procedure: ESOPHAGOGASTRODUODENOSCOPY (EGD);  Surgeon: Ladene Artist, MD;  Location: [facility] ENDOSCOPY;  Service: Endoscopy;  Laterality: N/A;  . HERNIA REPAIR    . LAPAROSCOPIC GASTRIC BAND REMOVAL WITH LAPAROSCOPIC GASTRIC SLEEVE  RESECTION  2016  . LAPAROSCOPIC GASTRIC BANDING    . TUBAL LIGATION      Family History  Problem Relation Age of Onset  . Breast cancer Mother   . Diabetes Mother   . COPD Mother   . Heart disease Father        and Sister  . Cirrhosis Father        Non-alcholic   . Diabetes Father   . Hypertension Father   . Kidney disease Father        chronic kidney stones  . Diabetes Sister   . Diabetes Brother   . Colon cancer Neg Hx     Current Outpatient Medications on File Prior to Visit  Medication Sig Dispense Refill  . albuterol (PROVENTIL HFA;VENTOLIN HFA) 108 (90 BASE) MCG/ACT inhaler Inhale 2 puffs into the lungs every 6 (six) hours as needed for wheezing or shortness of breath.    . AMBULATORY NON FORMULARY MEDICATION Medication Name: tool softner + laxative 1 po daily    . calcium carbonate (TUMS - DOSED IN MG ELEMENTAL CALCIUM) 500 MG chewable tablet Chew 2 tablets by mouth as needed.    . diazepam (VALIUM) 5 MG tablet Take 1 tablet by mouth as needed.    Marland Kitchen glipiZIDE-metformin (METAGLIP) 5-500 MG per tablet Take 1 tablet by mouth at bedtime.     Marland Kitchen HYDROcodone-acetaminophen (NORCO/VICODIN) 5-325 MG tablet Take 1 tablet by mouth every 6 (six) hours as needed. 14 tablet 0  . ipratropium-albuterol (DUONEB) 0.5-2.5 (3) MG/3ML SOLN Inhale 3 mLs into the lungs every  6 (six) hours as needed.     . Liraglutide (VICTOZA) 18 MG/3ML SOPN Inject 1.8 mg into the skin daily.     . meclizine (ANTIVERT) 25 MG tablet Take 1 tablet by mouth as needed.    . metFORMIN (GLUCOPHAGE-XR) 500 MG 24 hr tablet Take 2 tablets by mouth daily with breakfast.    . pantoprazole (PROTONIX) 40 MG tablet Take 1 tablet by mouth as needed.    . sertraline (ZOLOFT) 50 MG tablet Take 50 mg by mouth daily.    Marland Kitchen tiZANidine (ZANAFLEX) 2 MG tablet Take 2 mg by mouth every 6 (six) hours as needed for muscle spasms.    . Vitamin D, Ergocalciferol, (DRISDOL) 1.25 MG (50000 UT) CAPS capsule Take 1 capsule by mouth once a week.      No current facility-administered medications on file prior to visit.    Allergies  Allergen Reactions  . Iodine Rash    Topical only - not IV dye Topical only - not IV dye   . Sulfa Antibiotics Other (See Comments)    "paralyzes me"  . Cephalexin Itching and Rash  . Ciprofloxacin Hcl Itching and Rash  . Penicillins Itching and Rash    Social History   Substance and Sexual Activity  Alcohol Use Yes   Comment: occasion - once or twice a year    Social History   Tobacco Use  Smoking Status Former Smoker  . Quit date: 11/16/2005  . Years since quitting: 13.9  Smokeless Tobacco Never Used    Review of Systems  Constitutional: Positive for malaise/fatigue.  HENT: Negative.   Eyes: Negative.   Respiratory: Negative.   Cardiovascular: Negative.   Gastrointestinal: Positive for abdominal pain, heartburn and nausea.  Genitourinary: Negative.   Musculoskeletal: Positive for joint pain and neck pain.  Skin: Negative.   Neurological: Positive for dizziness and sensory change.  Endo/Heme/Allergies: Negative.   Psychiatric/Behavioral: Negative.     Objective   Vitals:   10/08/19 1042  BP: 133/80  Pulse: 73  Resp: 12  Temp: 97.7 F (36.5 C)  SpO2: 98%    Physical Exam Vitals reviewed.  Constitutional:      Appearance: Normal appearance. She is not ill-appearing.  HENT:     Head: Normocephalic and atraumatic.  Cardiovascular:     Rate and Rhythm: Normal rate and regular rhythm.     Heart sounds: Normal heart sounds. No murmur. No friction rub. No gallop.   Pulmonary:     Effort: Pulmonary effort is normal. No respiratory distress.     Breath sounds: Normal breath sounds. No stridor. No wheezing, rhonchi or rales.  Abdominal:     General: Bowel sounds are normal. There is no distension.     Palpations: Abdomen is soft. There is no mass.     Tenderness: There is no abdominal tenderness. There is no guarding or rebound.     Hernia: No hernia is present.      Comments: No tenderness to deep palpation in the right upper quadrant.  Skin:    General: Skin is warm and dry.  Neurological:     Mental Status: She is alert and oriented to person, place, and time.    ER visit, labs, and ultrasound reports are reviewed Assessment  Right upper quadrant abdominal pain, possible chronic cholecystitis versus gastritis/peptic ulcer disease, status post sleeve gastrectomy Plan   Patient may need HIDA scan pending evaluation by gastroenterology.  I told her that would like to see what they  say prior to pursuing any further testing.  She understands this and agrees.  Follow-up expectantly.

## 2019-10-08 NOTE — Patient Instructions (Signed)
Biliary Colic, Adult  Biliary colic is severe pain caused by a problem with a small organ in the upper right part of your belly (gallbladder). The gallbladder stores a digestive fluid produced in the liver (bile) that helps the body break down fat. Bile and other digestive enzymes are carried from the liver to the small intestine through tube-like structures (bile ducts). The gallbladder and the bile ducts form the biliary tract. Sometimes hard deposits of digestive fluids form in the gallbladder (gallstones) and block the flow of bile from the gallbladder, causing biliary colic. This condition is also called a gallbladder attack. Gallstones can be as small as a grain of sand or as big as a golf ball. There could be just one gallstone in the gallbladder, or there could be many. What are the causes? Biliary colic is usually caused by gallstones. Less often, a tumor could block the flow of bile from the gallbladder and trigger biliary colic. What increases the risk? This condition is more likely to develop in:  Women.  People of Hispanic descent.  People with a family history of gallstones.  People who are obese.  People who suddenly or quickly lose weight.  People who eat a high-calorie, low-fiber diet that is rich in refined carbs (carbohydrates), such as white bread and white rice.  People who have an intestinal disease that affects nutrient absorption, such as Crohn disease.  People who have a metabolic condition, such as metabolic syndrome or diabetes. What are the signs or symptoms? Severe pain in the upper right side of the belly is the main symptom of biliary colic. You may feel this pain below the chest but above the hip. This pain often occurs at night or after eating a very fatty meal. This pain may get worse for up to an hour and last as long as 12 hours. In most cases, the pain fades (subsides) within a couple hours. Other symptoms of this condition include:  Nausea and  vomiting.  Pain under the right shoulder. How is this diagnosed? This condition is diagnosed based on your medical history, your symptoms, and a physical exam. You may have tests, including:  Blood tests to rule out infection or inflammation of the bile ducts, gallbladder, pancreas, or liver.  Imaging studies such as: ? Ultrasound. ? CT scan. ? MRI. In some cases, you may need to have an imaging study done using a small amount of radioactive material (nuclear medicine) to confirm the diagnosis. How is this treated? Treatment for this condition may include medicine to relieve your pain or nausea. If you have gallstones that are causing biliary colic, you may need surgery to remove the gallbladder (cholecystectomy). Gallstones can also be dissolved gradually with medicine. It may take months or years before the gallstones are completely gone. Follow these instructions at home:  Take over-the-counter and prescription medicines only as told by your health care provider.  Drink enough fluid to keep your urine clear or pale yellow.  Follow instructions from your health care provider about eating or drinking restrictions. These may include avoiding: ? Fatty, greasy, and fried foods. ? Any foods that make the pain worse. ? Overeating. ? Having a large meal after not eating for a while.  Keep all follow-up visits as told by your health care provider. This is important. How is this prevented? Steps to prevent this condition include:  Maintaining a healthy body weight.  Getting regular exercise.  Eating a healthy, high-fiber, low-fat diet.  Limiting how much   sugar and refined carbs you eat, such as sweets, white flour, and white rice. Contact a health care provider if:  Your pain lasts more than 5 hours.  You vomit.  You have a fever and chills.  Your pain gets worse. Get help right away if:  Your skin or the whites of your eyes look yellow (jaundice).  Your have tea-colored  urine and light-colored stools.  You are dizzy or you faint. Summary  Biliary colic is severe pain caused by a problem with a small organ in the upper right part of your belly (gallbladder).  Treatments for this condition include medicines that relieves your pain or nausea and medicines that slowly dissolves the gallstones.  If gallstones cause your biliary colic, the treatment is surgery to remove the gallbladder (cholecystectomy). This information is not intended to replace advice given to you by your health care provider. Make sure you discuss any questions you have with your health care provider. Document Revised: 05/03/2017 Document Reviewed: 12/05/2015 Elsevier Patient Education  2020 [employer]  

## 2019-10-13 ENCOUNTER — Ambulatory Visit: Payer: Self-pay | Admitting: General Surgery

## 2019-10-15 ENCOUNTER — Encounter: Payer: Self-pay | Admitting: Physician Assistant

## 2019-10-15 ENCOUNTER — Ambulatory Visit (INDEPENDENT_AMBULATORY_CARE_PROVIDER_SITE_OTHER): Payer: 59 | Admitting: Physician Assistant

## 2019-10-15 VITALS — BP 124/72 | HR 75 | Temp 97.8°F | Ht 67.0 in | Wt 219.0 lb

## 2019-10-15 DIAGNOSIS — K219 Gastro-esophageal reflux disease without esophagitis: Secondary | ICD-10-CM | POA: Diagnosis not present

## 2019-10-15 DIAGNOSIS — R1011 Right upper quadrant pain: Secondary | ICD-10-CM

## 2019-10-15 MED ORDER — PANTOPRAZOLE SODIUM 40 MG PO TBEC
40.0000 mg | DELAYED_RELEASE_TABLET | Freq: Every day | ORAL | 11 refills | Status: DC
Start: 2019-10-15 — End: 2021-08-22

## 2019-10-15 NOTE — Patient Instructions (Addendum)
If you are age 56 or older, your body mass index should be between 23-30. Your Body mass index is 34.3 kg/m. If this is out of the aforementioned range listed, please consider follow up with your Primary Care Provider.  If you are age 40 or younger, your body mass index should be between 19-25. Your Body mass index is 34.3 kg/m. If this is out of the aformentioned range listed, please consider follow up with your Primary Care Provider.   We have sent the following medications to your pharmacy for you to pick up at your convenience: Pantoprazole 40 mg daily 30-60 minutes before breakfast.   You have been scheduled for a HIDA scan at [facility] (1st floor) on Friday 10/30/19. Please arrive 15 minutes prior to your scheduled appointment at  [id number] am. Make certain not to have anything to eat or drink at least 6 hours prior to your test. Should this appointment date or time not work well for you, please call radiology scheduling at [phone].  _____________________________________________________________________ hepatobiliary (HIDA) scan is an imaging procedure used to diagnose problems in the liver, gallbladder and bile ducts. In the HIDA scan, a radioactive chemical or tracer is injected into a vein in your arm. The tracer is handled by the liver like bile. Bile is a fluid produced and excreted by your liver that helps your digestive system break down fats in the foods you eat. Bile is stored in your gallbladder and the gallbladder releases the bile when you eat a meal. A special nuclear medicine scanner (gamma camera) tracks the flow of the tracer from your liver into your gallbladder and small intestine.  During your HIDA scan  You'll be asked to change into a hospital gown before your HIDA scan begins. Your health care team will position you on a table, usually on your back. The radioactive tracer is then injected into a vein in your arm.The tracer travels through your bloodstream to your  liver, where it's taken up by the bile-producing cells. The radioactive tracer travels with the bile from your liver into your gallbladder and through your bile ducts to your small intestine.You may feel some pressure while the radioactive tracer is injected into your vein. As you lie on the table, a special gamma camera is positioned over your abdomen taking pictures of the tracer as it moves through your body. The gamma camera takes pictures continually for about an hour. You'll need to keep still during the HIDA scan. This can become uncomfortable, but you may find that you can lessen the discomfort by taking deep breaths and thinking about other things. Tell your health care team if you're uncomfortable. The radiologist will watch on a computer the progress of the radioactive tracer through your body. The HIDA scan may be stopped when the radioactive tracer is seen in the gallbladder and enters your small intestine. This typically takes about an hour. In some cases extra imaging will be performed if original images aren't satisfactory, if morphine is given to help visualize the gallbladder or if the medication CCK is given to look at the contraction of the gallbladder. This test typically takes 2 hours to complete. _______________________________________________________________

## 2019-10-15 NOTE — Addendum Note (Signed)
Addended by: Horris Latino on: 10/15/2019 11:00 AM   Modules accepted: Orders

## 2019-10-15 NOTE — Progress Notes (Signed)
Chief Complaint: Follow-up ER visit right upper quadrant abdominal pain  Review of pertinent gastrointestinal problems: 1.  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  Multiple food impactions at the site of her lap band 2012 through 2015 or so.  Numerous food impaction events led to upper endoscopies by a variety of gastroenterologist in my group nearly all of them showed that the food was impacted in her proximal stomach, below the GE junction, at the site of her lap band device.  She was recommended very strongly to have this addressed by bariatric surgeon.  December 2016 transition from lap band to gastric sleeve in [place] and this significantly helped her dysphagia issues.  She has had no swallowing trouble since then.  HPI:    Terri [name] is a 54 year old Caucasian female, known to Dr. Ardis Hughs, with a past medical history as listed below including COPD, bariatric surgery and reflux, who was referred to me by Fredia Sorrow MD in the ER, for complaint of right upper quadrant abdominal pain.      10/22/2013 EGD with LA class a esophagitis, banded gastroplasty in the gastric cardia and foreign body removal.    01/05/2019 patient seen in clinic by Dr. Ardis Hughs for constipation.  At that time the report from her 2016 colonoscopy was requested and she was told she did not need repeat for 10 years.    10/04/2019 patient seen in the ER for right upper quadrant abdominal pain which came on suddenly with some radiation to the epigastrium with nausea and vomiting.  Labs showed a CBC with a white count minimally elevated at 12.7, normal CMP and lipase as well as troponins.  CT abdomen pelvis with contrast showed findings consistent with history of prior gastric surgery and a small stable area of fat necrosis within the anterior omentum as well as aortic atherosclerosis.  Right upper quadrant ultrasound with no acute hepatobiliary findings, hepatic steatosis without focal mass.  Patient was pain-free at  time of discharge.  Is recommended patient have outpatient HIDA scan for further eval of her gallbladder as it was slightly distended.    Today, the patient tells me that over the past couple of months she has 2 episodes of severe right upper quadrant abdominal pain which seems to last for a few hours at a time.  The last episode brought her to the ER with 4 doses of morphine before it finally went away, as above.  Explains that she has been having cramping in the right upper quadrant here and there for months, it will come and go for about 5 to 10 minutes, rated as a 7-8/10 but has been becoming more frequent and now, since her severe episode she has been paying more attention.  Tells me she has also been burping more and is having some heartburn and reflux.  Explains that she has been off of her Pantoprazole 40 mg for the past year because her PCP is changing.  Also explains that she was previously constipated but she is now having a bowel movement once a day over the past week which is a change for her.  Is not sure if the pain episodes are brought on by eating or not but she has not really paid attention.  These episodes are associated with some nausea.    Denies fever, chills, weight loss, blood in her stool or symptoms that awaken her from sleep.     Past Medical History:  Diagnosis Date  . Arthritis   . Asthma   .  COPD (chronic obstructive pulmonary disease) ([place])   . Diabetes mellitus   . GERD (gastroesophageal reflux disease)   . Obesity   . Osteoporosis   . Vertigo     Past Surgical History:  Procedure Laterality Date  . BLADDER SURGERY    . ESOPHAGOGASTRODUODENOSCOPY  11/17/2011   Procedure: ESOPHAGOGASTRODUODENOSCOPY (EGD);  Surgeon: Milus Banister, MD;  Location: [facility];  Service: Endoscopy;  Laterality: N/A;  . ESOPHAGOGASTRODUODENOSCOPY  01/02/2012   Procedure: ESOPHAGOGASTRODUODENOSCOPY (EGD);  Surgeon: Milus Banister, MD;  Location: [facility];  Service: Endoscopy;   Laterality: N/A;  . ESOPHAGOGASTRODUODENOSCOPY N/A 01/22/2013   Procedure: ESOPHAGOGASTRODUODENOSCOPY (EGD);  Surgeon: Jerene Bears, MD;  Location: [facility];  Service: Gastroenterology;  Laterality: N/A;  . ESOPHAGOGASTRODUODENOSCOPY N/A 10/22/2013   Procedure: ESOPHAGOGASTRODUODENOSCOPY (EGD);  Surgeon: Ladene Artist, MD;  Location: [facility] ENDOSCOPY;  Service: Endoscopy;  Laterality: N/A;  . HERNIA REPAIR    . LAPAROSCOPIC GASTRIC BAND REMOVAL WITH LAPAROSCOPIC GASTRIC SLEEVE RESECTION  2016  . LAPAROSCOPIC GASTRIC BANDING    . TUBAL LIGATION      Current Outpatient Medications  Medication Sig Dispense Refill  . albuterol (PROVENTIL HFA;VENTOLIN HFA) 108 (90 BASE) MCG/ACT inhaler Inhale 2 puffs into the lungs every 6 (six) hours as needed for wheezing or shortness of breath.    . AMBULATORY NON FORMULARY MEDICATION Medication Name: tool softner + laxative 1 po daily    . calcium carbonate (TUMS - DOSED IN MG ELEMENTAL CALCIUM) 500 MG chewable tablet Chew 2 tablets by mouth as needed.    . diazepam (VALIUM) 5 MG tablet Take 1 tablet by mouth as needed.    Marland Kitchen glipiZIDE-metformin (METAGLIP) 5-500 MG per tablet Take 1 tablet by mouth at bedtime.     Marland Kitchen HYDROcodone-acetaminophen (NORCO/VICODIN) 5-325 MG tablet Take 1 tablet by mouth every 6 (six) hours as needed. 14 tablet 0  . ipratropium-albuterol (DUONEB) 0.5-2.5 (3) MG/3ML SOLN Inhale 3 mLs into the lungs every 6 (six) hours as needed.     . Liraglutide (VICTOZA) 18 MG/3ML SOPN Inject 1.8 mg into the skin daily.     . meclizine (ANTIVERT) 25 MG tablet Take 1 tablet by mouth as needed.    . metFORMIN (GLUCOPHAGE-XR) 500 MG 24 hr tablet Take 2 tablets by mouth daily with breakfast.    . pantoprazole (PROTONIX) 40 MG tablet Take 1 tablet by mouth as needed.    . sertraline (ZOLOFT) 50 MG tablet Take 50 mg by mouth daily.    Marland Kitchen tiZANidine (ZANAFLEX) 2 MG tablet Take 2 mg by mouth every 6 (six) hours as needed for muscle spasms.    . Vitamin D,  Ergocalciferol, (DRISDOL) 1.25 MG (50000 UT) CAPS capsule Take 1 capsule by mouth once a week.     No current facility-administered medications for this visit.    Allergies as of 10/15/2019 - Review Complete 10/08/2019  Allergen Reaction Noted  . Iodine Rash 04/21/2015  . Sulfa antibiotics Other (See Comments) 10/02/2010  . Cephalexin Itching and Rash 10/02/2010  . Ciprofloxacin hcl Itching and Rash 10/02/2010  . Penicillins Itching and Rash 10/02/2010    Family History  Problem Relation Age of Onset  . Breast cancer Mother   . Diabetes Mother   . COPD Mother   . Heart disease Father        and Sister  . Cirrhosis Father        Non-alcholic   . Diabetes Father   . Hypertension Father   . Kidney  disease Father        chronic kidney stones  . Diabetes Sister   . Diabetes Brother   . Colon cancer Neg Hx     Social History   Socioeconomic History  . Marital status: Married    Spouse name: Not on file  . Number of children: 4  . Years of education: Not on file  . Highest education level: Not on file  Occupational History  . Occupation: Disabled     Employer: UNEMPLOYED  Tobacco Use  . Smoking status: Former Smoker    Quit date: 11/16/2005    Years since quitting: 13.9  . Smokeless tobacco: Never Used  Substance and Sexual Activity  . Alcohol use: Yes    Comment: occasion - once or twice a year  . Drug use: No  . Sexual activity: Yes    Birth control/protection: Surgical  Other Topics Concern  . Not on file  Social History Narrative   2 caffeine drinks daily    Social Determinants of Health   Financial Resource Strain:   . Difficulty of Paying Living Expenses:   Food Insecurity:   . Worried About Charity fundraiser in the Last Year:   . Arboriculturist in the Last Year:   Transportation Needs:   . Film/video editor (Medical):   Marland Kitchen Lack of Transportation (Non-Medical):   Physical Activity:   . Days of Exercise per Week:   . Minutes of Exercise per  Session:   Stress:   . Feeling of Stress :   Social Connections:   . Frequency of Communication with Friends and Family:   . Frequency of Social Gatherings with Friends and Family:   . Attends Religious Services:   . Active Member of Clubs or Organizations:   . Attends Archivist Meetings:   Marland Kitchen Marital Status:   Intimate Partner Violence:   . Fear of Current or Ex-Partner:   . Emotionally Abused:   Marland Kitchen Physically Abused:   . Sexually Abused:     Review of Systems:    Constitutional: No weight loss, fever or chills Skin: No rash  Cardiovascular: No chest pain  Respiratory: No SOB Gastrointestinal: See HPI and otherwise negative   Physical Exam:  Vital signs: BP 124/72   Pulse 75   Temp 97.8 F (36.6 C)   Ht 5\' 7"  (1.702 m)   Wt 219 lb (99.3 kg)   LMP 07/30/2010   BMI 34.30 kg/m   Constitutional:   Pleasant overweight Caucasian female appears to be in NAD, Well developed, Well nourished, alert and cooperative Respiratory: Respirations even and unlabored. Lungs clear to auscultation bilaterally.   No wheezes, crackles, or rhonchi.  Cardiovascular: Normal S1, S2. No MRG. Regular rate and rhythm. No peripheral edema, cyanosis or pallor.  Gastrointestinal:  Soft, nondistended, moderate RUQ ttp, No rebound or guarding. Normal bowel sounds. No appreciable masses or hepatomegaly. Psychiatric: Demonstrates good judgement and reason without abnormal affect or behaviors.  RELEVANT LABS AND IMAGING: CBC    Component Value Date/Time   WBC 12.7 (H) 10/04/2019 0027   RBC 4.83 10/04/2019 0027   HGB 14.2 10/04/2019 0027   HCT 44.0 10/04/2019 0027   PLT 262 10/04/2019 0027   MCV 91.1 10/04/2019 0027   MCH 29.4 10/04/2019 0027   MCHC 32.3 10/04/2019 0027   RDW 12.6 10/04/2019 0027   LYMPHSABS 1.9 10/04/2019 0027   MONOABS 0.5 10/04/2019 0027   EOSABS 0.1 10/04/2019 0027   BASOSABS  0.0 10/04/2019 0027    CMP     Component Value Date/Time   NA 137 10/04/2019 0027   K  4.5 10/04/2019 0027   CL 102 10/04/2019 0027   CO2 23 10/04/2019 0027   GLUCOSE 245 (H) 10/04/2019 0027   BUN 14 10/04/2019 0027   CREATININE 0.79 10/04/2019 0027   CALCIUM 9.6 10/04/2019 0027   PROT 7.9 10/04/2019 0027   ALBUMIN 4.2 10/04/2019 0027   AST 24 10/04/2019 0027   ALT 26 10/04/2019 0027   ALKPHOS 113 10/04/2019 0027   BILITOT 0.3 10/04/2019 0027   GFRNONAA >60 10/04/2019 0027   GFRAA >60 10/04/2019 0027    Assessment: 1.  Right upper quadrant pain: Episodes of "spasms", per patient, recent work-up in the ER with right upper quadrant ultrasound showing distention, labs and CT were otherwise normal; consider gallbladder dysfunction versus gastric origin 2.  Heartburn and reflux: Increased over the past few months with some eructations, patient is off of her Pantoprazole  Plan: 1.  Scheduled patient for HIDA scan with [facility] for further evaluation of right upper quadrant abdominal pain. 2.  Prescribed Pantoprazole 40 mg daily, 30 to 60 minutes before eating breakfast #30 with 11 refills 3.  Discussed with patient that pending results of HIDA scan could consider EGD for further eval. 4.  Patient to follow in clinic per recommendations after testing above with Dr. Ardis Hughs or myself.  Ellouise Newer, PA-C [facility] 10/15/2019, 10:23 AM  Cc: Sinda Du, MD

## 2019-10-15 NOTE — Progress Notes (Signed)
I agree with the above note, logical stepwise plan laid out.

## 2019-10-16 NOTE — Progress Notes (Signed)
I agree with the above note, plan 

## 2019-10-30 ENCOUNTER — Ambulatory Visit (HOSPITAL_COMMUNITY): Payer: 59

## 2019-11-18 ENCOUNTER — Encounter (HOSPITAL_COMMUNITY)
Admission: RE | Admit: 2019-11-18 | Discharge: 2019-11-18 | Disposition: A | Discharge status: Home / Self Care | Payer: 59 | Source: Ambulatory Visit | Attending: Physician Assistant | Admitting: Physician Assistant

## 2019-11-18 ENCOUNTER — Other Ambulatory Visit: Payer: Self-pay

## 2019-11-18 DIAGNOSIS — R1011 Right upper quadrant pain: Secondary | ICD-10-CM | POA: Diagnosis present

## 2019-11-18 DIAGNOSIS — K219 Gastro-esophageal reflux disease without esophagitis: Secondary | ICD-10-CM | POA: Diagnosis not present

## 2019-11-18 MED ORDER — TECHNETIUM TC 99M MEBROFENIN IV KIT
5.0500 | PACK | Freq: Once | INTRAVENOUS | Status: AC | PRN
  Administered 2019-11-18: 5.05 via INTRAVENOUS

## 2019-11-26 ENCOUNTER — Ambulatory Visit (AMBULATORY_SURGERY_CENTER): Payer: Self-pay | Admitting: *Deleted

## 2019-11-26 ENCOUNTER — Other Ambulatory Visit: Payer: Self-pay

## 2019-11-26 VITALS — Ht 67.0 in | Wt 221.0 lb

## 2019-11-26 DIAGNOSIS — R1011 Right upper quadrant pain: Secondary | ICD-10-CM

## 2019-11-26 DIAGNOSIS — Z01818 Encounter for other preprocedural examination: Secondary | ICD-10-CM

## 2019-11-26 DIAGNOSIS — K219 Gastro-esophageal reflux disease without esophagitis: Secondary | ICD-10-CM

## 2019-11-26 NOTE — Addendum Note (Signed)
Addended by: Levonne Spiller on: 11/26/2019 05:52 PM   Modules accepted: Orders

## 2019-11-26 NOTE — Progress Notes (Addendum)
Patient is here in-person for PV. Patient denies any allergies to eggs or soy. Patient denies any problems with anesthesia/sedation. Patient denies any oxygen use at home. Patient denies taking any blood thinners. Pt aware to stop Phentermine today, 10 days before exam. Patient is not being treated for MRSA or C-diff. Patient is aware of our care-partner policy and [id number]-[id number] safety protocol. COVID-19 screening test is on 7/2 at 1 pm per patient's request, the pt is aware.

## 2019-12-02 ENCOUNTER — Telehealth: Payer: Self-pay | Admitting: Gastroenterology

## 2019-12-02 NOTE — Telephone Encounter (Signed)
Called pt- LMTRC   Pt instructed continue antibiotic as directed , with no issues for upcoming colon

## 2019-12-02 NOTE — Telephone Encounter (Signed)
[place] to me

## 2019-12-02 NOTE — Telephone Encounter (Signed)
Pt states that she got a UTI so she was put antibiotic Nitrofurantoin for 7 days beginning today. She is scheduled for a procedure on 7/6. She wants to know if she can still proceed with that.

## 2019-12-02 NOTE — Telephone Encounter (Signed)
Pt notified ok to take ABX for UTI with no issues - Lelan Pons PV

## 2019-12-04 ENCOUNTER — Other Ambulatory Visit: Payer: Self-pay | Admitting: Gastroenterology

## 2019-12-04 ENCOUNTER — Ambulatory Visit (INDEPENDENT_AMBULATORY_CARE_PROVIDER_SITE_OTHER): Payer: 59

## 2019-12-04 DIAGNOSIS — Z1159 Encounter for screening for other viral diseases: Secondary | ICD-10-CM

## 2019-12-05 LAB — SARS CORONAVIRUS 2 (TAT 6-24 HRS): SARS Coronavirus 2: NEGATIVE

## 2019-12-08 ENCOUNTER — Encounter: Payer: Self-pay | Admitting: Gastroenterology

## 2019-12-08 ENCOUNTER — Other Ambulatory Visit: Payer: Self-pay

## 2019-12-08 ENCOUNTER — Ambulatory Visit (AMBULATORY_SURGERY_CENTER): Payer: 59 | Admitting: Gastroenterology

## 2019-12-08 VITALS — BP 117/60 | HR 55 | Temp 97.2°F | Resp 19 | Ht 67.0 in | Wt 221.0 lb

## 2019-12-08 DIAGNOSIS — K219 Gastro-esophageal reflux disease without esophagitis: Secondary | ICD-10-CM

## 2019-12-08 DIAGNOSIS — K319 Disease of stomach and duodenum, unspecified: Secondary | ICD-10-CM | POA: Diagnosis not present

## 2019-12-08 DIAGNOSIS — K2970 Gastritis, unspecified, without bleeding: Secondary | ICD-10-CM

## 2019-12-08 DIAGNOSIS — K2950 Unspecified chronic gastritis without bleeding: Secondary | ICD-10-CM

## 2019-12-08 MED ORDER — SODIUM CHLORIDE 0.9 % IV SOLN: 500.0000 mL | Freq: Once | INTRAVENOUS | Status: DC

## 2019-12-08 NOTE — Progress Notes (Signed)
Called to room to assist during endoscopic procedure.  Patient ID and intended procedure confirmed with present staff. Received instructions for my participation in the procedure from the performing physician.  

## 2019-12-08 NOTE — Progress Notes (Signed)
A and O x3. Report to RN. Tolerated MAC anesthesia well.Teeth unchanged after procedure.

## 2019-12-08 NOTE — Patient Instructions (Signed)
HANDOUTS PROVIDED ON: GASTRITIS  The biopsies taken today have been sent for pathology.  The results can take 1-3 weeks to receive.     You may resume your previous diet and medication schedule.    Please begin taking OTC Miralax once daily.  Thank you for allowing [place] to care for you today!!!   YOU HAD AN ENDOSCOPIC PROCEDURE TODAY AT [facility]:   Refer to the procedure report that was given to you for any specific questions about what was found during the examination.  If the procedure report does not answer your questions, please call your gastroenterologist to clarify.  If you requested that your care partner not be given the details of your procedure findings, then the procedure report has been included in a sealed envelope for you to review at your convenience later.  YOU SHOULD EXPECT: Some feelings of bloating in the abdomen. Passage of more gas than usual.  Walking can help get rid of the air that was put into your GI tract during the procedure and reduce the bloating.  Please Note:  You might notice some irritation and congestion in your nose or some drainage.  This is from the oxygen used during your procedure.  There is no need for concern and it should clear up in a day or so.  SYMPTOMS TO REPORT IMMEDIATELY:   Following upper endoscopy (EGD)  Vomiting of blood or coffee ground material  New chest pain or pain under the shoulder blades  Painful or persistently difficult swallowing  New shortness of breath  Fever of 100F or higher  Black, tarry-looking stools  For urgent or emergent issues, a gastroenterologist can be reached at any hour by calling [phone]. Do not use MyChart messaging for urgent concerns.    DIET:  We do recommend a small meal at first, but then you may proceed to your regular diet.  Drink plenty of fluids but you should avoid alcoholic beverages for 24 hours.  ACTIVITY:  You should plan to take it easy for the rest of today  and you should NOT DRIVE or use heavy machinery until tomorrow (because of the sedation medicines used during the test).    FOLLOW UP: Our staff will call the number listed on your records 48-72 hours following your procedure to check on you and address any questions or concerns that you may have regarding the information given to you following your procedure. If we do not reach you, we will leave a message.  We will attempt to reach you two times.  During this call, we will ask if you have developed any symptoms of COVID 19. If you develop any symptoms (ie: fever, flu-like symptoms, shortness of breath, cough etc.) before then, please call [phone].  If you test positive for Covid 19 in the 2 weeks post procedure, please call and report this information to [place].    If any biopsies were taken you will be contacted by phone or by letter within the next 1-3 weeks.  Please call us at [phone] if you have not heard about the biopsies in 3 weeks.    SIGNATURES/CONFIDENTIALITY: You and/or your care partner have signed paperwork which will be entered into your electronic medical record.  These signatures attest to the fact that that the information above on your After Visit Summary has been reviewed and is understood.  Full responsibility of the confidentiality of this discharge information lies with you and/or your care-partner.

## 2019-12-08 NOTE — Op Note (Signed)
[facility] Patient Name: Terri [name] Procedure Date: 12/08/2019 2:25 PM MRN: [id number] Endoscopist: Milus Banister , MD Age: 54 Referring MD:  Date of Birth: [birth year] Gender: Female Account #: [id number] Procedure:                Upper GI endoscopy Indications:              Abdominal pain in the right upper quadrant; remote                            lap band surgery eventually converted to gastric                            sleeve following multiple food impactions at band                            site Medicines:                Monitored Anesthesia Care Procedure:                Pre-Anesthesia Assessment:                           - Prior to the procedure, a History and Physical                            was performed, and patient medications and                            allergies were reviewed. The patient's tolerance of                            previous anesthesia was also reviewed. The risks                            and benefits of the procedure and the sedation                            options and risks were discussed with the patient.                            All questions were answered, and informed consent                            was obtained. Prior Anticoagulants: The patient has                            taken no previous anticoagulant or antiplatelet                            agents. ASA Grade Assessment: II - A patient with                            mild systemic disease. After reviewing the risks  and benefits, the patient was deemed in                            satisfactory condition to undergo the procedure.                           After obtaining informed consent, the endoscope was                            passed under direct vision. Throughout the                            procedure, the patient's blood pressure, pulse, and                            oxygen saturations were monitored continuously. The                             Endoscope was introduced through the mouth, and                            advanced to the second part of duodenum. The upper                            GI endoscopy was accomplished without difficulty.                            The patient tolerated the procedure well. Scope In: Scope Out: Findings:                 Typical anatomic abnormality related to previous                            gastric sleeve bariatric surgery.                           Mild inflammation characterized by erythema and                            friability was found in the gastric antrum.                            Biopsies were taken with a cold forceps for                            histology.                           The exam was otherwise without abnormality. Complications:            No immediate complications. Estimated blood loss:                            None. Estimated Blood Loss:     Estimated blood loss: none. Impression:               -  Typical anatomic abnormality related to previous                            gastric sleeve bariatric surgery.                           - Gastritis. Biopsied to check for H. pylori\.                           - The examination was otherwise normal. Recommendation:           - Patient has a contact number available for                            emergencies. The signs and symptoms of potential                            delayed complications were discussed with the                            patient. Return to normal activities tomorrow.                            Written discharge instructions were provided to the                            patient.                           - Resume previous diet.                           - Continue present medications.                           - Await pathology results.                           - Trial of once daily OTC miralax to help your                            chronic constipation;  hopefully this will in time                            also help your intermittent right sided abdominal                            pains. Milus Banister, MD 12/08/2019 2:56:09 PM This report has been signed electronically.

## 2019-12-08 NOTE — Progress Notes (Signed)
Pt's states no medical or surgical changes since previsit or office visit. 

## 2019-12-10 ENCOUNTER — Other Ambulatory Visit: Payer: Self-pay

## 2019-12-10 ENCOUNTER — Telehealth: Payer: Self-pay | Admitting: *Deleted

## 2019-12-10 NOTE — Telephone Encounter (Signed)
Attempted 2nd f/u phone call. No answer. Left message.  °

## 2019-12-10 NOTE — Telephone Encounter (Signed)
Attempted f/u phone call. No answer. Left message. °

## 2019-12-18 ENCOUNTER — Encounter: Payer: Self-pay | Admitting: Gastroenterology

## 2020-01-23 ENCOUNTER — Other Ambulatory Visit: Payer: Self-pay

## 2020-01-23 ENCOUNTER — Encounter: Payer: Self-pay | Admitting: Family Medicine

## 2020-01-23 ENCOUNTER — Ambulatory Visit
Admission: EM | Admit: 2020-01-23 | Discharge: 2020-01-23 | Disposition: A | Discharge status: Home / Self Care | Payer: 59 | Attending: Family Medicine | Admitting: Family Medicine

## 2020-01-23 DIAGNOSIS — K219 Gastro-esophageal reflux disease without esophagitis: Secondary | ICD-10-CM

## 2020-01-23 NOTE — Discharge Instructions (Signed)
Your EKG was normal  This is most likely acid reflux You have Protonix at the pharmacy You can also use Maalox as needed Start taking this.  Avoid spicy, greasy, fried foods.  Coffee, teas and chocolate can also be irritating to the stomach. If the symptoms continue follow-up with your primary care for further management

## 2020-01-23 NOTE — ED Triage Notes (Signed)
Pt presents with c/o burning  Epigastric pain that developed after eating

## 2020-01-26 NOTE — ED Provider Notes (Signed)
[place]    CSN: [id number] Arrival date & time: 01/23/20  1035      History   Chief Complaint Chief Complaint  Patient presents with  . Gastroesophageal Reflux    HPI Terri [name] is a 54 y.o. female.   Patient is a 54 year old female with past medical history of arthritis, asthma, COPD, diabetes, GERD, obesity.  She presents today with burning and epigastric discomfort.  Developed after eating [employer] [place] fries.  Has been constant.  Is not currently taking any antiacid medication.  Denies any chest pain or shortness of breath.  Denies any fever, nausea, vomiting or diarrhea.     Past Medical History:  Diagnosis Date  . Arthritis   . Asthma   . COPD (chronic obstructive pulmonary disease) ([place])   . Diabetes mellitus   . GERD (gastroesophageal reflux disease)   . Obesity   . Osteoporosis   . Vertigo     Patient Active Problem List   Diagnosis Date Noted  . 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 Bilateral shoulder pain 01/02/2016  . Constipated 06/23/2015  . Pain of left calf 05/18/2015  . Mixed hyperlipidemia 04/21/2015  . Vitamin D deficiency 04/21/2015  . Former smoker 11/17/2014  . 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 Chest pain 08/19/2012  . COPD exacerbation ([place]) 08/19/2012  . Diabetes ([place]) 08/19/2012  . Lapband in [facility] 2009 02/13/2012  . Nonspecific (abnormal) findings on radiological and other examination of gastrointestinal tract 01/02/2012  . Foreign body in stomach 01/02/2012  . Dysphagia 11/17/2011    Past Surgical History:  Procedure Laterality Date  . BLADDER SURGERY    . ESOPHAGOGASTRODUODENOSCOPY  11/17/2011   Procedure: ESOPHAGOGASTRODUODENOSCOPY (EGD);  Surgeon: Milus Banister, MD;  Location: [place];  Service: Endoscopy;  Laterality: N/A;  . ESOPHAGOGASTRODUODENOSCOPY  01/02/2012   Procedure: ESOPHAGOGASTRODUODENOSCOPY (EGD);  Surgeon: Milus Banister, MD;  Location: [place];   Service: Endoscopy;  Laterality: N/A;  . ESOPHAGOGASTRODUODENOSCOPY N/A 01/22/2013   Procedure: ESOPHAGOGASTRODUODENOSCOPY (EGD);  Surgeon: Jerene Bears, MD;  Location: [place];  Service: Gastroenterology;  Laterality: N/A;  . ESOPHAGOGASTRODUODENOSCOPY N/A 10/22/2013   Procedure: ESOPHAGOGASTRODUODENOSCOPY (EGD);  Surgeon: Ladene Artist, MD;  Location: [facility] ENDOSCOPY;  Service: Endoscopy;  Laterality: N/A;  . HERNIA REPAIR    . LAPAROSCOPIC GASTRIC BAND REMOVAL WITH LAPAROSCOPIC GASTRIC SLEEVE RESECTION  2016  . LAPAROSCOPIC GASTRIC BANDING    . TUBAL LIGATION    . UPPER GASTROINTESTINAL ENDOSCOPY      OB History   No obstetric history on file.      Home Medications    Prior to Admission medications   Medication Sig Start Date End Date Taking? Authorizing Provider  albuterol (PROVENTIL HFA;VENTOLIN HFA) 108 (90 BASE) MCG/ACT inhaler Inhale 2 puffs into the lungs every 6 (six) hours as needed for wheezing or shortness of breath.    [provider]  AMBULATORY NON FORMULARY MEDICATION Medication Name: tool softner + laxative 1 po daily    [provider]  calcium carbonate (TUMS - DOSED IN MG ELEMENTAL CALCIUM) 500 MG chewable tablet Chew 2 tablets by mouth as needed.    [provider]  diazepam (VALIUM) 5 MG tablet Take 1 tablet by mouth as needed. 08/16/14   [provider]  escitalopram (LEXAPRO) 20 MG tablet Take 20 mg by mouth daily.    [provider]  ipratropium-albuterol (DUONEB) 0.5-2.5 (3) MG/3ML SOLN Inhale 3 mLs into the lungs every 6 (six) hours as needed.  09/10/12   [provider]  Liraglutide (VICTOZA) 18 MG/3ML SOPN Inject 1.8 mg into the skin daily.     [provider]  meclizine (ANTIVERT) 25 MG tablet Take 1 tablet by mouth as needed. 08/16/14   [provider]  metFORMIN (GLUCOPHAGE-XR) 500 MG 24 hr tablet Take 2 tablets by mouth daily with breakfast.    [provider]  pantoprazole  (PROTONIX) 40 MG tablet Take 1 tablet (40 mg total) by mouth daily. 10/15/19   Levin Erp, PA  phentermine 37.5 MG capsule Take 37.5 mg by mouth every morning. 1/2 tab a day    [provider]  tiZANidine (ZANAFLEX) 2 MG tablet Take 2 mg by mouth every 6 (six) hours as needed for muscle spasms.    [provider]  Vitamin D, Ergocalciferol, (DRISDOL) 1.25 MG (50000 UT) CAPS capsule Take 1 capsule by mouth once a week. 12/31/18   [provider]    Family History Family History  Problem Relation Age of Onset  . Breast cancer Mother   . Diabetes Mother   . COPD Mother   . Heart disease Father        and Sister  . Cirrhosis Father        Non-alcholic   . Diabetes Father   . Hypertension Father   . Kidney disease Father        chronic kidney stones  . Diabetes Sister   . Diabetes Brother   . Colon cancer Neg Hx     Social History Social History   Tobacco Use  . Smoking status: Former Smoker    Quit date: 11/16/2005    Years since quitting: 14.2  . Smokeless tobacco: Never Used  Vaping Use  . Vaping Use: Never used  Substance Use Topics  . Alcohol use: Yes    Comment: occasion - once or twice a year  . Drug use: No     Allergies   Iodine, Sulfa antibiotics, Cephalexin, Ciprofloxacin hcl, and Penicillins   Review of Systems Review of Systems   Physical Exam Triage Vital Signs ED Triage Vitals  Enc Vitals Group     BP 01/23/20 1050 118/85     Pulse Rate 01/23/20 1050 72     Resp 01/23/20 1050 17     Temp 01/23/20 1050 98.6 F (37 C)     Temp Source 01/23/20 1050 Oral     SpO2 01/23/20 1050 96 %     Weight --      Height --      Head Circumference --      Peak Flow --      Pain Score 01/23/20 1101 6     Pain Loc --      Pain Edu? --      Excl. in [place]? --    No data found.  Updated Vital Signs BP 118/85 (BP Location: Right Arm)   Pulse 72   Temp 98.6 F (37 C) (Oral)   Resp 17   LMP 07/30/2010   SpO2 96%    Visual Acuity Right Eye Distance:   Left Eye Distance:   Bilateral Distance:    Right Eye Near:   Left Eye Near:    Bilateral Near:     Physical Exam Vitals and nursing note reviewed.  Constitutional:      General: She is not in acute distress.    Appearance: Normal appearance. She is not ill-appearing, toxic-appearing or diaphoretic.  HENT:  Head: Normocephalic.     Nose: Nose normal.  Eyes:     Conjunctiva/sclera: Conjunctivae normal.  Cardiovascular:     Rate and Rhythm: Normal rate and regular rhythm.  Pulmonary:     Effort: Pulmonary effort is normal.     Breath sounds: Normal breath sounds.  Abdominal:     Palpations: Abdomen is soft.     Tenderness: There is abdominal tenderness in the epigastric area.    Musculoskeletal:        General: Normal range of motion.     Cervical back: Normal range of motion.  Skin:    General: Skin is warm and dry.     Findings: No rash.  Neurological:     Mental Status: She is alert.  Psychiatric:        Mood and Affect: Mood normal.      UC Treatments / Results  Labs (all labs ordered are listed, but only abnormal results are displayed) Labs Reviewed - No data to display  EKG   Radiology No results found.  Procedures Procedures (including critical care time)  Medications Ordered in UC Medications - No data to display  Initial Impression / Assessment and Plan / UC Course  I have reviewed the triage vital signs and the nursing notes.  Pertinent labs & imaging results that were available during my care of the patient were reviewed by me and considered in my medical decision making (see chart for details).     GERD EKG with normal sinus rhythm and normal rate here today. Signs and symptoms consistent with GERD. We will have her restart the Protonix.  Maalox as needed. Diet precautions given Recommend follow-up with primary care as needed Final Clinical Impressions(s) / UC Diagnoses   Final diagnoses:   Gastroesophageal reflux disease without esophagitis     Discharge Instructions     Your EKG was normal  This is most likely acid reflux You have Protonix at the pharmacy You can also use Maalox as needed Start taking this.  Avoid spicy, greasy, fried foods.  Coffee, teas and chocolate can also be irritating to the stomach. If the symptoms continue follow-up with your primary care for further management    ED Prescriptions    None     PDMP not reviewed this encounter.   Orvan July, NP 01/26/20 [phone]

## 2020-01-28 ENCOUNTER — Other Ambulatory Visit: Payer: 59

## 2020-01-28 ENCOUNTER — Other Ambulatory Visit: Payer: Self-pay | Admitting: Critical Care Medicine

## 2020-01-28 DIAGNOSIS — Z20822 Contact with and (suspected) exposure to covid-19: Secondary | ICD-10-CM

## 2020-01-29 LAB — NOVEL CORONAVIRUS, NAA: SARS-CoV-2, NAA: NOT DETECTED

## 2020-01-29 LAB — SARS-COV-2, NAA 2 DAY TAT

## 2020-08-07 ENCOUNTER — Encounter: Payer: Self-pay | Admitting: Emergency Medicine

## 2020-08-07 ENCOUNTER — Other Ambulatory Visit: Payer: Self-pay

## 2020-08-07 ENCOUNTER — Ambulatory Visit
Admission: EM | Admit: 2020-08-07 | Discharge: 2020-08-07 | Disposition: A | Discharge status: Home / Self Care | Payer: Medicare Other | Attending: Family Medicine | Admitting: Family Medicine

## 2020-08-07 DIAGNOSIS — H9201 Otalgia, right ear: Secondary | ICD-10-CM

## 2020-08-07 DIAGNOSIS — J0101 Acute recurrent maxillary sinusitis: Secondary | ICD-10-CM | POA: Diagnosis not present

## 2020-08-07 DIAGNOSIS — J029 Acute pharyngitis, unspecified: Secondary | ICD-10-CM

## 2020-08-07 MED ORDER — LEVOCETIRIZINE DIHYDROCHLORIDE 5 MG PO TABS: 5.0000 mg | ORAL_TABLET | Freq: Every evening | ORAL | 0 refills | Status: DC

## 2020-08-07 MED ORDER — DOXYCYCLINE HYCLATE 100 MG PO CAPS: 100.0000 mg | ORAL_CAPSULE | Freq: Two times a day (BID) | ORAL | 0 refills | Status: DC

## 2020-08-07 NOTE — ED Provider Notes (Signed)
[facility]    CSN: [id number] Arrival date & time: 08/07/20  1003      History   Chief Complaint No chief complaint on file.   HPI Terri [name] is a 55 y.o. female.   HPI  Patient presents for evaluation of right facial swelling, nasal congestion R>L nares, facial pressure, right ear pain, and right cervical adenopathy and headache. Symptoms present for 6 days. Now experiencing productive cough. Afebrile. No known sick contacts. Taken OTC medications without relief. Past Medical History:  Diagnosis Date  . Arthritis   . Asthma   . COPD (chronic obstructive pulmonary disease) ([place])   . Diabetes mellitus   . GERD (gastroesophageal reflux disease)   . Obesity   . Osteoporosis   . Vertigo     Patient Active Problem List   Diagnosis Date Noted  . 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 Bilateral shoulder pain 01/02/2016  . Constipated 06/23/2015  . Pain of left calf 05/18/2015  . Mixed hyperlipidemia 04/21/2015  . Vitamin D deficiency 04/21/2015  . Former smoker 11/17/2014  . 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 Chest pain 08/19/2012  . COPD exacerbation ([place]) 08/19/2012  . Diabetes ([place]) 08/19/2012  . Lapband in [facility] 2009 02/13/2012  . Nonspecific (abnormal) findings on radiological and other examination of gastrointestinal tract 01/02/2012  . Foreign body in stomach 01/02/2012  . Dysphagia 11/17/2011    Past Surgical History:  Procedure Laterality Date  . BLADDER SURGERY    . ESOPHAGOGASTRODUODENOSCOPY  11/17/2011   Procedure: ESOPHAGOGASTRODUODENOSCOPY (EGD);  Surgeon: Milus Banister, MD;  Location: [place];  Service: Endoscopy;  Laterality: N/A;  . ESOPHAGOGASTRODUODENOSCOPY  01/02/2012   Procedure: ESOPHAGOGASTRODUODENOSCOPY (EGD);  Surgeon: Milus Banister, MD;  Location: [place];  Service: Endoscopy;  Laterality: N/A;  . ESOPHAGOGASTRODUODENOSCOPY N/A 01/22/2013   Procedure: ESOPHAGOGASTRODUODENOSCOPY (EGD);   Surgeon: Jerene Bears, MD;  Location: [place];  Service: Gastroenterology;  Laterality: N/A;  . ESOPHAGOGASTRODUODENOSCOPY N/A 10/22/2013   Procedure: ESOPHAGOGASTRODUODENOSCOPY (EGD);  Surgeon: Ladene Artist, MD;  Location: [facility] ENDOSCOPY;  Service: Endoscopy;  Laterality: N/A;  . HERNIA REPAIR    . LAPAROSCOPIC GASTRIC BAND REMOVAL WITH LAPAROSCOPIC GASTRIC SLEEVE RESECTION  2016  . LAPAROSCOPIC GASTRIC BANDING    . TUBAL LIGATION    . UPPER GASTROINTESTINAL ENDOSCOPY      OB History   No obstetric history on file.      Home Medications    Prior to Admission medications   Medication Sig Start Date End Date Taking? Authorizing Provider  albuterol (PROVENTIL HFA;VENTOLIN HFA) 108 (90 BASE) MCG/ACT inhaler Inhale 2 puffs into the lungs every 6 (six) hours as needed for wheezing or shortness of breath.    [provider]  AMBULATORY NON FORMULARY MEDICATION Medication Name: tool softner + laxative 1 po daily    [provider]  calcium carbonate (TUMS - DOSED IN MG ELEMENTAL CALCIUM) 500 MG chewable tablet Chew 2 tablets by mouth as needed.    [provider]  diazepam (VALIUM) 5 MG tablet Take 1 tablet by mouth as needed. 08/16/14   [provider]  escitalopram (LEXAPRO) 20 MG tablet Take 20 mg by mouth daily.    [provider]  ipratropium-albuterol (DUONEB) 0.5-2.5 (3) MG/3ML SOLN Inhale 3 mLs into the lungs every 6 (six) hours as needed.  09/10/12   [provider]  Liraglutide (VICTOZA) 18 MG/3ML SOPN Inject 1.8 mg into the skin daily.     [provider]  meclizine (ANTIVERT) 25 MG tablet Take 1 tablet by mouth as needed. 08/16/14   [provider]  metFORMIN (GLUCOPHAGE-XR) 500 MG 24 hr tablet Take 2 tablets by mouth daily with breakfast.    [provider]  pantoprazole (PROTONIX) 40 MG tablet Take 1 tablet (40 mg total) by mouth daily. 10/15/19   Levin Erp, PA  phentermine 37.5 MG  capsule Take 37.5 mg by mouth every morning. 1/2 tab a day    [provider]  tiZANidine (ZANAFLEX) 2 MG tablet Take 2 mg by mouth every 6 (six) hours as needed for muscle spasms.    [provider]  Vitamin D, Ergocalciferol, (DRISDOL) 1.25 MG (50000 UT) CAPS capsule Take 1 capsule by mouth once a week. 12/31/18   [provider]    Family History Family History  Problem Relation Age of Onset  . Breast cancer Mother   . Diabetes Mother   . COPD Mother   . Heart disease Father        and Sister  . Cirrhosis Father        Non-alcholic   . Diabetes Father   . Hypertension Father   . Kidney disease Father        chronic kidney stones  . Diabetes Sister   . Diabetes Brother   . Colon cancer Neg Hx     Social History Social History   Tobacco Use  . Smoking status: Former Smoker    Quit date: 11/16/2005    Years since quitting: 14.7  . Smokeless tobacco: Never Used  Vaping Use  . Vaping Use: Never used  Substance Use Topics  . Alcohol use: Yes    Comment: occasion - once or twice a year  . Drug use: No     Allergies   Iodine, Sulfa antibiotics, Cephalexin, Ciprofloxacin hcl, and Penicillins Review of Systems Review of Systems Pertinent negatives listed in HPI   Physical Exam Triage Vital Signs ED Triage Vitals [08/07/20 1150]  Enc Vitals Group     BP 138/85     Pulse Rate 93     Resp 18     Temp 98.7 F (37.1 C)     Temp Source Oral     SpO2 93 %     Weight      Height      Head Circumference      Peak Flow      Pain Score 6     Pain Loc      Pain Edu?      Excl. in [place]?    No data found.  Updated Vital Signs BP 138/85 (BP Location: Right Arm)   Pulse 93   Temp 98.7 F (37.1 C) (Oral)   Resp 18   LMP 07/30/2010   SpO2 93%   Visual Acuity Right Eye Distance:   Left Eye Distance:   Bilateral Distance:    Right Eye Near:   Left Eye Near:    Bilateral Near:     Physical Exam HENT:     Head: Normocephalic.      Right Ear: Tympanic membrane and external ear normal. There is no impacted cerumen.     Left Ear: Tympanic membrane and external ear normal.     Nose: Mucosal edema, congestion and rhinorrhea present.     Mouth/Throat:     Pharynx: Posterior oropharyngeal erythema present. No oropharyngeal exudate or uvula swelling.  Eyes:     Extraocular Movements: Extraocular movements intact.  Pupils: Pupils are equal, round, and reactive to light.  Cardiovascular:     Rate and Rhythm: Normal rate and regular rhythm.  Pulmonary:     Effort: Pulmonary effort is normal.     Breath sounds: Normal breath sounds.  Musculoskeletal:     Cervical back: Normal range of motion.  Lymphadenopathy:     Cervical: Cervical adenopathy present.  Skin:    Capillary Refill: Capillary refill takes less than 2 seconds.  Neurological:     General: No focal deficit present.     Mental Status: She is alert.  Psychiatric:        Mood and Affect: Mood normal.        Behavior: Behavior normal.      UC Treatments / Results  Labs (all labs ordered are listed, but only abnormal results are displayed) Labs Reviewed - No data to display  EKG   Radiology No results found.  Procedures Procedures (including critical care time)  Medications Ordered in UC Medications - No data to display  Initial Impression / Assessment and Plan / UC Course  I have reviewed the triage vital signs and the nursing notes.  Pertinent labs & imaging results that were available during my care of the patient were reviewed by me and considered in my medical decision making (see chart for details).    Acute sinusitis and pharyngitis treat within Doxycyline 100 mg twice daily x 10 days. Xyzal at bedtime to relieve rhinitis symptoms. Continue OTC anti cough medication. PCP follow-up if symptoms worsen or do not improve. Final Clinical Impressions(s) / UC Diagnoses   Final diagnoses:  Acute recurrent maxillary sinusitis  Right ear  pain  Acute pharyngitis, unspecified etiology   Discharge Instructions   None    ED Prescriptions    Medication Sig Dispense Auth. Provider   doxycycline (VIBRAMYCIN) 100 MG capsule Take 1 capsule (100 mg total) by mouth 2 (two) times daily. 20 capsule Scot Jun, FNP   levocetirizine (XYZAL) 5 MG tablet Take 1 tablet (5 mg total) by mouth every evening. 30 tablet Scot Jun, FNP     PDMP not reviewed this encounter.   Scot Jun, FNP 08/09/20 1128

## 2020-08-07 NOTE — ED Triage Notes (Signed)
Nasal congestion and productive cough - green in color since Tuesday,  Right ear pain

## 2020-08-07 NOTE — ED Provider Notes (Incomplete)
[facility]    CSN: [id number] Arrival date & time: 08/07/20  1003      History   Chief Complaint No chief complaint on file.   HPI Terri [name] is a 56 y.o. female.   HPI  Right sided facial swelling and cervical adenopathy   Past Medical History:  Diagnosis Date  . Arthritis   . Asthma   . COPD (chronic obstructive pulmonary disease) ([place])   . Diabetes mellitus   . GERD (gastroesophageal reflux disease)   . Obesity   . Osteoporosis   . Vertigo     Patient Active Problem List   Diagnosis Date Noted  . 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 Bilateral shoulder pain 01/02/2016  . Constipated 06/23/2015  . Pain of left calf 05/18/2015  . Mixed hyperlipidemia 04/21/2015  . Vitamin D deficiency 04/21/2015  . Former smoker 11/17/2014  . 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 Chest pain 08/19/2012  . COPD exacerbation ([place]) 08/19/2012  . Diabetes ([place]) 08/19/2012  . Lapband in [facility] 2009 02/13/2012  . Nonspecific (abnormal) findings on radiological and other examination of gastrointestinal tract 01/02/2012  . Foreign body in stomach 01/02/2012  . Dysphagia 11/17/2011    Past Surgical History:  Procedure Laterality Date  . BLADDER SURGERY    . ESOPHAGOGASTRODUODENOSCOPY  11/17/2011   Procedure: ESOPHAGOGASTRODUODENOSCOPY (EGD);  Surgeon: Milus Banister, MD;  Location: [place];  Service: Endoscopy;  Laterality: N/A;  . ESOPHAGOGASTRODUODENOSCOPY  01/02/2012   Procedure: ESOPHAGOGASTRODUODENOSCOPY (EGD);  Surgeon: Milus Banister, MD;  Location: [place];  Service: Endoscopy;  Laterality: N/A;  . ESOPHAGOGASTRODUODENOSCOPY N/A 01/22/2013   Procedure: ESOPHAGOGASTRODUODENOSCOPY (EGD);  Surgeon: Jerene Bears, MD;  Location: [place];  Service: Gastroenterology;  Laterality: N/A;  . ESOPHAGOGASTRODUODENOSCOPY N/A 10/22/2013   Procedure: ESOPHAGOGASTRODUODENOSCOPY (EGD);  Surgeon: Ladene Artist, MD;  Location: [facility] ENDOSCOPY;   Service: Endoscopy;  Laterality: N/A;  . HERNIA REPAIR    . LAPAROSCOPIC GASTRIC BAND REMOVAL WITH LAPAROSCOPIC GASTRIC SLEEVE RESECTION  2016  . LAPAROSCOPIC GASTRIC BANDING    . TUBAL LIGATION    . UPPER GASTROINTESTINAL ENDOSCOPY      OB History   No obstetric history on file.      Home Medications    Prior to Admission medications   Medication Sig Start Date End Date Taking? Authorizing Provider  albuterol (PROVENTIL HFA;VENTOLIN HFA) 108 (90 BASE) MCG/ACT inhaler Inhale 2 puffs into the lungs every 6 (six) hours as needed for wheezing or shortness of breath.    [provider]  AMBULATORY NON FORMULARY MEDICATION Medication Name: tool softner + laxative 1 po daily    [provider]  calcium carbonate (TUMS - DOSED IN MG ELEMENTAL CALCIUM) 500 MG chewable tablet Chew 2 tablets by mouth as needed.    [provider]  diazepam (VALIUM) 5 MG tablet Take 1 tablet by mouth as needed. 08/16/14   [provider]  escitalopram (LEXAPRO) 20 MG tablet Take 20 mg by mouth daily.    [provider]  ipratropium-albuterol (DUONEB) 0.5-2.5 (3) MG/3ML SOLN Inhale 3 mLs into the lungs every 6 (six) hours as needed.  09/10/12   [provider]  Liraglutide (VICTOZA) 18 MG/3ML SOPN Inject 1.8 mg into the skin daily.     [provider]  meclizine (ANTIVERT) 25 MG tablet Take 1 tablet by mouth as needed. 08/16/14   [provider]  metFORMIN (GLUCOPHAGE-XR) 500 MG 24 hr tablet Take 2 tablets by mouth daily  with breakfast.    [provider]  pantoprazole (PROTONIX) 40 MG tablet Take 1 tablet (40 mg total) by mouth daily. 10/15/19   Levin Erp, PA  phentermine 37.5 MG capsule Take 37.5 mg by mouth every morning. 1/2 tab a day    [provider]  tiZANidine (ZANAFLEX) 2 MG tablet Take 2 mg by mouth every 6 (six) hours as needed for muscle spasms.    [provider]  Vitamin D, Ergocalciferol,  (DRISDOL) 1.25 MG (50000 UT) CAPS capsule Take 1 capsule by mouth once a week. 12/31/18   [provider]    Family History Family History  Problem Relation Age of Onset  . Breast cancer Mother   . Diabetes Mother   . COPD Mother   . Heart disease Father        and Sister  . Cirrhosis Father        Non-alcholic   . Diabetes Father   . Hypertension Father   . Kidney disease Father        chronic kidney stones  . Diabetes Sister   . Diabetes Brother   . Colon cancer Neg Hx     Social History Social History   Tobacco Use  . Smoking status: Former Smoker    Quit date: 11/16/2005    Years since quitting: 14.7  . Smokeless tobacco: Never Used  Vaping Use  . Vaping Use: Never used  Substance Use Topics  . Alcohol use: Yes    Comment: occasion - once or twice a year  . Drug use: No     Allergies   Iodine, Sulfa antibiotics, Cephalexin, Ciprofloxacin hcl, and Penicillins   Review of Systems Review of Systems   Physical Exam Triage Vital Signs ED Triage Vitals [08/07/20 1150]  Enc Vitals Group     BP 138/85     Pulse Rate 93     Resp 18     Temp 98.7 F (37.1 C)     Temp Source Oral     SpO2 93 %     Weight      Height      Head Circumference      Peak Flow      Pain Score 6     Pain Loc      Pain Edu?      Excl. in [place]?    No data found.  Updated Vital Signs BP 138/85 (BP Location: Right Arm)   Pulse 93   Temp 98.7 F (37.1 C) (Oral)   Resp 18   LMP 07/30/2010   SpO2 93%   Visual Acuity Right Eye Distance:   Left Eye Distance:   Bilateral Distance:    Right Eye Near:   Left Eye Near:    Bilateral Near:     Physical Exam   UC Treatments / Results  Labs (all labs ordered are listed, but only abnormal results are displayed) Labs Reviewed - No data to display  EKG   Radiology No results found.  Procedures Procedures (including critical care time)  Medications Ordered in UC Medications - No data to display  Initial  Impression / Assessment and Plan / UC Course  I have reviewed the triage vital signs and the nursing notes.  Pertinent labs & imaging results that were available during my care of the patient were reviewed by me and considered in my medical decision making (see chart for details).     *** Final Clinical Impressions(s) / UC  Diagnoses   Final diagnoses:  None   Discharge Instructions   None    ED Prescriptions    None     PDMP not reviewed this encounter.

## 2020-09-01 ENCOUNTER — Ambulatory Visit
Admission: RE | Admit: 2020-09-01 | Discharge: 2020-09-01 | Disposition: A | Discharge status: Home / Self Care | Payer: Medicare Other | Source: Ambulatory Visit | Attending: Internal Medicine | Admitting: Internal Medicine

## 2020-09-01 ENCOUNTER — Other Ambulatory Visit: Payer: Self-pay

## 2020-09-01 VITALS — BP 127/74 | HR 68 | Temp 98.3°F | Resp 18

## 2020-09-01 DIAGNOSIS — N3000 Acute cystitis without hematuria: Secondary | ICD-10-CM | POA: Insufficient documentation

## 2020-09-01 LAB — POCT URINALYSIS DIP (MANUAL ENTRY)
Bilirubin, UA: NEGATIVE
Blood, UA: NEGATIVE
Glucose, UA: >=1000 mg/dL — AB
Ketones, POC UA: NEGATIVE mg/dL
Leukocytes, UA: NEGATIVE
Nitrite, UA: POSITIVE — AB
Protein Ur, POC: NEGATIVE mg/dL
Spec Grav, UA: 1.01 (ref 1.010–1.025)
Urobilinogen, UA: 0.2 E.U./dL
pH, UA: 5 (ref 5.0–8.0)

## 2020-09-01 MED ORDER — NITROFURANTOIN MONOHYD MACRO 100 MG PO CAPS: 100.0000 mg | ORAL_CAPSULE | Freq: Two times a day (BID) | ORAL | 0 refills | Status: DC

## 2020-09-01 NOTE — ED Provider Notes (Signed)
[facility]    CSN: [id number] Arrival date & time: 09/01/20  1647      History   Chief Complaint No chief complaint on file.   HPI Terri [name] is a 55 y.o. female who developed dysuria and frequency x 2 days. Had some Macrobid left over and took one dose last night and one this am. Denies fever or flank pain    Past Medical History:  Diagnosis Date  . Arthritis   . Asthma   . COPD (chronic obstructive pulmonary disease) ([place])   . Diabetes mellitus   . GERD (gastroesophageal reflux disease)   . Obesity   . Osteoporosis   . Vertigo     Patient Active Problem List   Diagnosis Date Noted  . 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 Bilateral shoulder pain 01/02/2016  . Constipated 06/23/2015  . Pain of left calf 05/18/2015  . Mixed hyperlipidemia 04/21/2015  . Vitamin D deficiency 04/21/2015  . Former smoker 11/17/2014  . 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 Chest pain 08/19/2012  . COPD exacerbation ([place]) 08/19/2012  . Diabetes ([place]) 08/19/2012  . Lapband in [facility] 2009 02/13/2012  . Nonspecific (abnormal) findings on radiological and other examination of gastrointestinal tract 01/02/2012  . Foreign body in stomach 01/02/2012  . Dysphagia 11/17/2011    Past Surgical History:  Procedure Laterality Date  . BLADDER SURGERY    . ESOPHAGOGASTRODUODENOSCOPY  11/17/2011   Procedure: ESOPHAGOGASTRODUODENOSCOPY (EGD);  Surgeon: Milus Banister, MD;  Location: [place];  Service: Endoscopy;  Laterality: N/A;  . ESOPHAGOGASTRODUODENOSCOPY  01/02/2012   Procedure: ESOPHAGOGASTRODUODENOSCOPY (EGD);  Surgeon: Milus Banister, MD;  Location: [place];  Service: Endoscopy;  Laterality: N/A;  . ESOPHAGOGASTRODUODENOSCOPY N/A 01/22/2013   Procedure: ESOPHAGOGASTRODUODENOSCOPY (EGD);  Surgeon: Jerene Bears, MD;  Location: [place];  Service: Gastroenterology;  Laterality: N/A;  . ESOPHAGOGASTRODUODENOSCOPY N/A 10/22/2013   Procedure:  ESOPHAGOGASTRODUODENOSCOPY (EGD);  Surgeon: Ladene Artist, MD;  Location: [facility] ENDOSCOPY;  Service: Endoscopy;  Laterality: N/A;  . HERNIA REPAIR    . LAPAROSCOPIC GASTRIC BAND REMOVAL WITH LAPAROSCOPIC GASTRIC SLEEVE RESECTION  2016  . LAPAROSCOPIC GASTRIC BANDING    . TUBAL LIGATION    . UPPER GASTROINTESTINAL ENDOSCOPY      OB History   No obstetric history on file.      Home Medications    Prior to Admission medications   Medication Sig Start Date End Date Taking? Authorizing Provider  nitrofurantoin, macrocrystal-monohydrate, (MACROBID) 100 MG capsule Take 1 capsule (100 mg total) by mouth 2 (two) times daily. 09/01/20  Yes Rodriguez-Southworth, Sunday Spillers, PA-C  albuterol (PROVENTIL HFA;VENTOLIN HFA) 108 (90 BASE) MCG/ACT inhaler Inhale 2 puffs into the lungs every 6 (six) hours as needed for wheezing or shortness of breath.    [provider]  AMBULATORY NON FORMULARY MEDICATION Medication Name: tool softner + laxative 1 po daily    [provider]  calcium carbonate (TUMS - DOSED IN MG ELEMENTAL CALCIUM) 500 MG chewable tablet Chew 2 tablets by mouth as needed.    [provider]  diazepam (VALIUM) 5 MG tablet Take 1 tablet by mouth as needed. 08/16/14   [provider]  doxycycline (VIBRAMYCIN) 100 MG capsule Take 1 capsule (100 mg total) by mouth 2 (two) times daily. 08/07/20   Scot Jun, FNP  escitalopram (LEXAPRO) 20 MG tablet Take 20 mg by mouth daily.    [provider]  ipratropium-albuterol (DUONEB) 0.5-2.5 (3) MG/3ML SOLN Inhale 3 mLs into  the lungs every 6 (six) hours as needed.  09/10/12   [provider]  levocetirizine (XYZAL) 5 MG tablet Take 1 tablet (5 mg total) by mouth every evening. 08/07/20   Scot Jun, FNP  Liraglutide (VICTOZA) 18 MG/3ML SOPN Inject 1.8 mg into the skin daily.     [provider]  meclizine (ANTIVERT) 25 MG tablet Take 1 tablet by mouth as needed. 08/16/14   [provider]  metFORMIN (GLUCOPHAGE-XR) 500 MG 24 hr tablet Take 2 tablets by mouth daily with breakfast.    [provider]  pantoprazole (PROTONIX) 40 MG tablet Take 1 tablet (40 mg total) by mouth daily. 10/15/19   Levin Erp, PA  phentermine 37.5 MG capsule Take 37.5 mg by mouth every morning. 1/2 tab a day    [provider]  tiZANidine (ZANAFLEX) 2 MG tablet Take 2 mg by mouth every 6 (six) hours as needed for muscle spasms.    [provider]  Vitamin D, Ergocalciferol, (DRISDOL) 1.25 MG (50000 UT) CAPS capsule Take 1 capsule by mouth once a week. 12/31/18   [provider]    Family History Family History  Problem Relation Age of Onset  . Breast cancer Mother   . Diabetes Mother   . COPD Mother   . Heart disease Father        and Sister  . Cirrhosis Father        Non-alcholic   . Diabetes Father   . Hypertension Father   . Kidney disease Father        chronic kidney stones  . Diabetes Sister   . Diabetes Brother   . Colon cancer Neg Hx     Social History Social History   Tobacco Use  . Smoking status: Former Smoker    Quit date: 11/16/2005    Years since quitting: 14.8  . Smokeless tobacco: Never Used  Vaping Use  . Vaping Use: Never used  Substance Use Topics  . Alcohol use: Yes    Comment: occasion - once or twice a year  . Drug use: No     Allergies   Iodine, Sulfa antibiotics, Cephalexin, Ciprofloxacin hcl, and Penicillins   Review of Systems Review of Systems   Physical Exam Triage Vital Signs ED Triage Vitals  Enc Vitals Group     BP 09/01/20 1727 127/74     Pulse Rate 09/01/20 1727 68     Resp 09/01/20 1727 18     Temp 09/01/20 1727 98.3 F (36.8 C)     Temp Source 09/01/20 1727 Oral     SpO2 09/01/20 1727 95 %     Weight --      Height --      Head Circumference --      Peak Flow --      Pain Score 09/01/20 1725 0     Pain Loc --      Pain Edu? --      Excl. in [place]? --    No data  found.  Updated Vital Signs BP 127/74 (BP Location: Right Arm)   Pulse 68   Temp 98.3 F (36.8 C) (Oral)   Resp 18   LMP 07/30/2010   SpO2 95%   Visual Acuity Right Eye Distance:   Left Eye Distance:   Bilateral Distance:    Right Eye Near:   Left Eye Near:    Bilateral Near:     Physical Exam Physical Exam Vitals and  nursing note reviewed.  Constitutional:      General: She is not in acute distress.    Appearance: She is not toxic-appearing.  HENT:     Head: Normocephalic.     Right Ear: External ear normal.     Left Ear: External ear normal.  Eyes:     General: No scleral icterus.    Conjunctiva/sclera: Conjunctivae normal.  Pulmonary:     Effort: Pulmonary effort is normal.  Abdominal:     General: Bowel sounds are normal.     Palpations: Abdomen is soft. There is no mass.     Tenderness: There is no guarding or rebound.     Comments: - CVA tenderness   Musculoskeletal:        General: Normal range of motion.     Cervical back: Neck supple.   Skin:    General: Skin is warm and dry.     Findings: No rash.  Neurological:     Mental Status: She is alert and oriented to person, place, and time.     Gait: Gait normal.  Psychiatric:        Mood and Affect: Mood normal.        Behavior: Behavior normal.        Thought Content: Thought content normal.        Judgment: Judgment normal.     UC Treatments / Results  Labs (all labs ordered are listed, but only abnormal results are displayed) Labs Reviewed  POCT URINALYSIS DIP (MANUAL ENTRY) - Abnormal; Notable for the following components:      Result Value   Color, UA orange (*)    Clarity, UA cloudy (*)    Glucose, UA >=1,000 (*)    Nitrite, UA Positive (*)    All other components within normal limits  URINE CULTURE    EKG   Radiology No results found.  Procedures Procedures (including critical care time)  Medications Ordered in UC Medications - No data to display  Initial Impression /  Assessment and Plan / UC Course  I have reviewed the triage vital signs and the nursing notes. Pertinent labs  results that were available during my care of the patient were reviewed by me and considered in my medical decision making (see chart for details). Has recurrent UTI. She was placed on Macrobid as noted. Urine culture was sent out and we will inform her if we need to change the medication according to urine culture results.   Final Clinical Impressions(s) / UC Diagnoses   Final diagnoses:  Acute cystitis without hematuria   Discharge Instructions   None    ED Prescriptions    Medication Sig Dispense Auth. Provider   nitrofurantoin, macrocrystal-monohydrate, (MACROBID) 100 MG capsule Take 1 capsule (100 mg total) by mouth 2 (two) times daily. 10 capsule Rodriguez-Southworth, Sunday Spillers, PA-C     PDMP not reviewed this encounter.   Shelby Mattocks, PA-C 09/01/20 1745

## 2020-09-01 NOTE — ED Triage Notes (Signed)
Issues with urination since Tuesday.  Pt went on a trip and was holding urine while on her trip. Has been taking azo and has taken a few abx she had left from last UTI.

## 2020-09-03 LAB — URINE CULTURE
Culture: <10000 — AB
Special Requests: NORMAL

## 2020-09-09 ENCOUNTER — Other Ambulatory Visit: Payer: Self-pay | Admitting: Family Medicine

## 2020-11-02 ENCOUNTER — Ambulatory Visit
Admission: RE | Admit: 2020-11-02 | Discharge: 2020-11-02 | Disposition: A | Discharge status: Home / Self Care | Payer: Medicare Other | Source: Ambulatory Visit | Attending: Family Medicine | Admitting: Family Medicine

## 2020-11-02 ENCOUNTER — Other Ambulatory Visit: Payer: Self-pay

## 2020-11-02 VITALS — BP 145/89 | HR 75 | Temp 98.0°F | Resp 18

## 2020-11-02 DIAGNOSIS — R350 Frequency of micturition: Secondary | ICD-10-CM | POA: Diagnosis present

## 2020-11-02 DIAGNOSIS — R300 Dysuria: Secondary | ICD-10-CM | POA: Diagnosis present

## 2020-11-02 DIAGNOSIS — N3000 Acute cystitis without hematuria: Secondary | ICD-10-CM | POA: Diagnosis present

## 2020-11-02 LAB — POCT URINALYSIS DIP (MANUAL ENTRY)
Bilirubin, UA: NEGATIVE
Blood, UA: NEGATIVE
Glucose, UA: >=1000 mg/dL — AB
Leukocytes, UA: NEGATIVE
Nitrite, UA: NEGATIVE
Protein Ur, POC: NEGATIVE mg/dL
Spec Grav, UA: 1.02 (ref 1.010–1.025)
Urobilinogen, UA: 0.2 E.U./dL
pH, UA: 5 (ref 5.0–8.0)

## 2020-11-02 MED ORDER — NITROFURANTOIN MONOHYD MACRO 100 MG PO CAPS: 100.0000 mg | ORAL_CAPSULE | Freq: Two times a day (BID) | ORAL | 0 refills | Status: DC

## 2020-11-02 NOTE — ED Triage Notes (Signed)
Burning and urinary frequency for the past couple of days.  Has taken some Macrobid and azo for the past couple of days.

## 2020-11-02 NOTE — Discharge Instructions (Signed)
I have sent in macrobid for you to take twice a day for 5 days  You may have a urinary tract infection.   We are going to culture your urine and will call you as soon as we have the results.   Drink plenty of water, 8-10 glasses per day.   You may take AZO over the counter for painful urination.  Follow up with your primary care provider as needed.   Go to the Emergency Department if you experience severe pain, shortness of breath, high fever, or other concerns.

## 2020-11-02 NOTE — ED Provider Notes (Signed)
[facility]   CC: UTI  SUBJECTIVE:  Terri [name] is a 55 y.o. female who complains of urinary frequency, urgency and dysuria for the past 2 days. Patient denies a precipitating event, recent sexual encounter, excessive caffeine intake. Localizes the pain to the lower abdomen. Pain is intermittent and describes it as sharp/burning. Has tried OTC medications without relief. Has also taken Macrobid for the last 24 hours. Symptoms are made worse with urination. Admits to similar symptoms in the past. Denies fever, chills, nausea, vomiting, flank pain, abnormal vaginal discharge or bleeding, hematuria.    LMP: Patient's last menstrual period was 07/30/2010.  ROS: As in HPI.  All other pertinent ROS negative.     Past Medical History:  Diagnosis Date  . Arthritis   . Asthma   . COPD (chronic obstructive pulmonary disease) ([place])   . Diabetes mellitus   . GERD (gastroesophageal reflux disease)   . Obesity   . Osteoporosis   . Vertigo    Past Surgical History:  Procedure Laterality Date  . BLADDER SURGERY    . ESOPHAGOGASTRODUODENOSCOPY  11/17/2011   Procedure: ESOPHAGOGASTRODUODENOSCOPY (EGD);  Surgeon: Milus Banister, MD;  Location: [place];  Service: Endoscopy;  Laterality: N/A;  . ESOPHAGOGASTRODUODENOSCOPY  01/02/2012   Procedure: ESOPHAGOGASTRODUODENOSCOPY (EGD);  Surgeon: Milus Banister, MD;  Location: [place];  Service: Endoscopy;  Laterality: N/A;  . ESOPHAGOGASTRODUODENOSCOPY N/A 01/22/2013   Procedure: ESOPHAGOGASTRODUODENOSCOPY (EGD);  Surgeon: Jerene Bears, MD;  Location: [place];  Service: Gastroenterology;  Laterality: N/A;  . ESOPHAGOGASTRODUODENOSCOPY N/A 10/22/2013   Procedure: ESOPHAGOGASTRODUODENOSCOPY (EGD);  Surgeon: Ladene Artist, MD;  Location: [facility] ENDOSCOPY;  Service: Endoscopy;  Laterality: N/A;  . HERNIA REPAIR    . LAPAROSCOPIC GASTRIC BAND REMOVAL WITH LAPAROSCOPIC GASTRIC SLEEVE RESECTION  2016  . LAPAROSCOPIC GASTRIC BANDING     . TUBAL LIGATION    . UPPER GASTROINTESTINAL ENDOSCOPY     Allergies  Allergen Reactions  . Iodine Rash    Topical only - not IV dye Topical only - not IV dye   . Sulfa Antibiotics Other (See Comments)    "paralyzes me"  . Cephalexin Itching and Rash  . Ciprofloxacin Hcl Itching and Rash  . Penicillins Itching and Rash   No current facility-administered medications on file prior to encounter.   Current Outpatient Medications on File Prior to Encounter  Medication Sig Dispense Refill  . albuterol (PROVENTIL HFA;VENTOLIN HFA) 108 (90 BASE) MCG/ACT inhaler Inhale 2 puffs into the lungs every 6 (six) hours as needed for wheezing or shortness of breath.    . AMBULATORY NON FORMULARY MEDICATION Medication Name: tool softner + laxative 1 po daily    . calcium carbonate (TUMS - DOSED IN MG ELEMENTAL CALCIUM) 500 MG chewable tablet Chew 2 tablets by mouth as needed.    . diazepam (VALIUM) 5 MG tablet Take 1 tablet by mouth as needed.    . doxycycline (VIBRAMYCIN) 100 MG capsule Take 1 capsule (100 mg total) by mouth 2 (two) times daily. 20 capsule 0  . escitalopram (LEXAPRO) 20 MG tablet Take 20 mg by mouth daily.    Marland Kitchen ipratropium-albuterol (DUONEB) 0.5-2.5 (3) MG/3ML SOLN Inhale 3 mLs into the lungs every 6 (six) hours as needed.     Marland Kitchen levocetirizine (XYZAL) 5 MG tablet Take 1 tablet (5 mg total) by mouth every evening. 30 tablet 0  . Liraglutide (VICTOZA) 18 MG/3ML SOPN Inject 1.8 mg into the skin daily.     . meclizine (  ANTIVERT) 25 MG tablet Take 1 tablet by mouth as needed.    . metFORMIN (GLUCOPHAGE-XR) 500 MG 24 hr tablet Take 2 tablets by mouth daily with breakfast.    . pantoprazole (PROTONIX) 40 MG tablet Take 1 tablet (40 mg total) by mouth daily. 30 tablet 11  . phentermine 37.5 MG capsule Take 37.5 mg by mouth every morning. 1/2 tab a day    . tiZANidine (ZANAFLEX) 2 MG tablet Take 2 mg by mouth every 6 (six) hours as needed for muscle spasms.    . Vitamin D, Ergocalciferol,  (DRISDOL) 1.25 MG (50000 UT) CAPS capsule Take 1 capsule by mouth once a week.     Social History   Socioeconomic History  . Marital status: Married    Spouse name: Not on file  . Number of children: 4  . Years of education: Not on file  . Highest education level: Not on file  Occupational History  . Occupation: Disabled     Employer: UNEMPLOYED  Tobacco Use  . Smoking status: Former Smoker    Quit date: 11/16/2005    Years since quitting: 14.9  . Smokeless tobacco: Never Used  Vaping Use  . Vaping Use: Never used  Substance and Sexual Activity  . Alcohol use: Yes    Comment: occasion - once or twice a year  . Drug use: No  . Sexual activity: Yes    Birth control/protection: Surgical  Other Topics Concern  . Not on file  Social History Narrative   2 caffeine drinks daily    Social Determinants of Health   Financial Resource Strain: Not on file  Food Insecurity: Not on file  Transportation Needs: Not on file  Physical Activity: Not on file  Stress: Not on file  Social Connections: Not on file  Intimate Partner Violence: Not on file   Family History  Problem Relation Age of Onset  . Breast cancer Mother   . Diabetes Mother   . COPD Mother   . Heart disease Father        and Sister  . Cirrhosis Father        Non-alcholic   . Diabetes Father   . Hypertension Father   . Kidney disease Father        chronic kidney stones  . Diabetes Sister   . Diabetes Brother   . Colon cancer Neg Hx     OBJECTIVE:  Vitals:   11/02/20 1512  BP: (!) 145/89  Pulse: 75  Resp: 18  Temp: 98 F (36.7 C)  TempSrc: Oral  SpO2: 95%   General appearance: AOx3 in no acute distress HEENT: NCAT. Oropharynx clear.  Lungs: clear to auscultation bilaterally without adventitious breath sounds Heart: regular rate and rhythm. Radial pulses 2+ symmetrical bilaterally Abdomen: soft; non-distended; suprapubic tenderness; bowel sounds present; no guarding or rebound tenderness Back: no  CVA tenderness Extremities: no edema; symmetrical with no gross deformities Skin: warm and dry Neurologic: Ambulates from chair to exam table without difficulty Psychological: alert and cooperative; normal mood and affect  Labs Reviewed  POCT URINALYSIS DIP (MANUAL ENTRY) - Abnormal; Notable for the following components:      Result Value   Color, UA straw (*)    Glucose, UA >=1,000 (*)    Ketones, POC UA trace (5) (*)    All other components within normal limits  URINE CULTURE    ASSESSMENT & PLAN:  1. Acute cystitis without hematuria   2. Dysuria   3. Urinary  frequency     Meds ordered this encounter  Medications  . nitrofurantoin, macrocrystal-monohydrate, (MACROBID) 100 MG capsule    Sig: Take 1 capsule (100 mg total) by mouth 2 (two) times daily.    Dispense:  10 capsule    Refill:  0    Order Specific Question:   Supervising Provider    Answer:   Chase Picket [[id number]]   UA unremarkable in office today Urine culture sent  Results could be skewed from antibiotic use Prescribed Macrobid 100mg  BID x 5 days Discussed not taking old medications We will call you with abnormal results that need further treatment Push fluids and get plenty of rest Take antibiotic as directed and to completion Take pyridium as prescribed and as needed for symptomatic relief Follow up with PCP if symptoms persists Return here or go to ER if you have any new or worsening symptoms such as fever, worsening abdominal pain, nausea/vomiting, flank pain  Outlined signs and symptoms indicating need for more acute intervention Patient verbalized understanding After Visit Summary given     Faustino Congress, NP 11/02/20 1525

## 2020-11-04 LAB — URINE CULTURE: Culture: <10000 — AB

## 2020-12-07 ENCOUNTER — Other Ambulatory Visit (HOSPITAL_COMMUNITY): Payer: Self-pay | Admitting: Surgery

## 2020-12-07 ENCOUNTER — Ambulatory Visit (HOSPITAL_COMMUNITY)
Admission: RE | Admit: 2020-12-07 | Discharge: 2020-12-07 | Disposition: A | Discharge status: Home / Self Care | Payer: Medicare Other | Source: Ambulatory Visit | Attending: Surgery | Admitting: Surgery

## 2020-12-07 DIAGNOSIS — E782 Mixed hyperlipidemia: Secondary | ICD-10-CM

## 2021-01-13 ENCOUNTER — Other Ambulatory Visit (HOSPITAL_COMMUNITY): Payer: Self-pay | Admitting: Internal Medicine

## 2021-02-03 ENCOUNTER — Emergency Department (HOSPITAL_COMMUNITY)
Admission: EM | Admit: 2021-02-03 | Discharge: 2021-02-03 | Disposition: A | Discharge status: Home / Self Care | Payer: Medicare Other | Attending: Emergency Medicine | Admitting: Emergency Medicine

## 2021-02-03 ENCOUNTER — Other Ambulatory Visit: Payer: Self-pay

## 2021-02-03 ENCOUNTER — Encounter (HOSPITAL_COMMUNITY): Payer: Self-pay | Admitting: Emergency Medicine

## 2021-02-03 DIAGNOSIS — J45909 Unspecified asthma, uncomplicated: Secondary | ICD-10-CM | POA: Diagnosis not present

## 2021-02-03 DIAGNOSIS — E119 Type 2 diabetes mellitus without complications: Secondary | ICD-10-CM | POA: Insufficient documentation

## 2021-02-03 DIAGNOSIS — Z7984 Long term (current) use of oral hypoglycemic drugs: Secondary | ICD-10-CM | POA: Diagnosis not present

## 2021-02-03 DIAGNOSIS — Z87891 Personal history of nicotine dependence: Secondary | ICD-10-CM | POA: Diagnosis not present

## 2021-02-03 DIAGNOSIS — Z9884 Bariatric surgery status: Secondary | ICD-10-CM

## 2021-02-03 DIAGNOSIS — R42 Dizziness and giddiness: Secondary | ICD-10-CM | POA: Diagnosis not present

## 2021-02-03 DIAGNOSIS — J441 Chronic obstructive pulmonary disease with (acute) exacerbation: Secondary | ICD-10-CM | POA: Insufficient documentation

## 2021-02-03 LAB — CBC WITH DIFFERENTIAL/PLATELET
Abs Immature Granulocytes: 0.02 10*3/uL (ref 0.00–0.07)
Basophils Absolute: 0 10*3/uL (ref 0.0–0.1)
Basophils Relative: 0 %
Eosinophils Absolute: 0.2 10*3/uL (ref 0.0–0.5)
Eosinophils Relative: 2 %
HCT: 38.2 % (ref 36.0–46.0)
Hemoglobin: 12.6 g/dL (ref 12.0–15.0)
Immature Granulocytes: 0 %
Lymphocytes Relative: 30 %
Lymphs Abs: 2.3 10*3/uL (ref 0.7–4.0)
MCH: 29.8 pg (ref 26.0–34.0)
MCHC: 33 g/dL (ref 30.0–36.0)
MCV: 90.3 fL (ref 80.0–100.0)
Monocytes Absolute: 0.3 10*3/uL (ref 0.1–1.0)
Monocytes Relative: 4 %
Neutro Abs: 4.8 10*3/uL (ref 1.7–7.7)
Neutrophils Relative %: 64 %
Platelets: 298 10*3/uL (ref 150–400)
RBC: 4.23 MIL/uL (ref 3.87–5.11)
RDW: 13.3 % (ref 11.5–15.5)
WBC: 7.6 10*3/uL (ref 4.0–10.5)
nRBC: 0 % (ref 0.0–0.2)

## 2021-02-03 LAB — COMPREHENSIVE METABOLIC PANEL
ALT: 63 U/L — ABNORMAL HIGH (ref 0–44)
AST: 50 U/L — ABNORMAL HIGH (ref 15–41)
Albumin: 3.6 g/dL (ref 3.5–5.0)
Alkaline Phosphatase: 107 U/L (ref 38–126)
Anion gap: 4 — ABNORMAL LOW (ref 5–15)
BUN: 7 mg/dL (ref 6–20)
CO2: 24 mmol/L (ref 22–32)
Calcium: 8.2 mg/dL — ABNORMAL LOW (ref 8.9–10.3)
Chloride: 108 mmol/L (ref 98–111)
Creatinine, Ser: 0.64 mg/dL (ref 0.44–1.00)
GFR, Estimated: >60 mL/min (ref >60)
Glucose, Bld: 149 mg/dL — ABNORMAL HIGH (ref 70–99)
Potassium: 3.5 mmol/L (ref 3.5–5.1)
Sodium: 136 mmol/L (ref 135–145)
Total Bilirubin: 0.7 mg/dL (ref 0.3–1.2)
Total Protein: 7.4 g/dL (ref 6.5–8.1)

## 2021-02-03 MED ORDER — MECLIZINE HCL 12.5 MG PO TABS
25.0000 mg | ORAL_TABLET | Freq: Once | ORAL | Status: AC
  Administered 2021-02-03: 25 mg via ORAL
  Filled 2021-02-03: qty 2

## 2021-02-03 MED ORDER — SODIUM CHLORIDE 0.9 % IV BOLUS
1000.0000 mL | Freq: Once | INTRAVENOUS | Status: AC
  Administered 2021-02-03: 1000 mL via INTRAVENOUS

## 2021-02-03 MED ORDER — MECLIZINE HCL 25 MG PO TABS: 25.0000 mg | ORAL_TABLET | Freq: Three times a day (TID) | ORAL | 0 refills | Status: AC | PRN

## 2021-02-03 NOTE — Discharge Instructions (Addendum)
Begin taking meclizine as prescribed.  Follow-up with your surgeon as scheduled, and return to the ER if symptoms significantly worsen or change.

## 2021-02-03 NOTE — ED Provider Notes (Signed)
[facility] EMERGENCY DEPARTMENT Provider Note   CSN: [id number] Arrival date & time: 02/03/21  0159     History Chief Complaint  Patient presents with   Multiple Complaints    Terri [name] is a 55 y.o. female.  Patient is a 55 year old female with past medical history of COPD, diabetes, asthma, and obesity.  Patient with history of gastric sleeve, then had full gastric bypass performed on 824 in [facility].  Patient reports feeling dizzy for the past 2 days.  She reports both a spinning sensation and also feeling as if she is going to pass out.  She called her surgeon who told her to drink plenty of water, which she did but is not feeling better.  She denies to me she is having any fevers or chills.  She denies any abdominal pain.  The history is provided by the patient.      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   Chest pain 08/19/2012   COPD exacerbation ([place]) 08/19/2012   Diabetes ([place]) 08/19/2012   Lapband in [facility] 2009 02/13/2012   Nonspecific (abnormal) findings on radiological and other examination of gastrointestinal tract 01/02/2012   Foreign body in stomach 01/02/2012   Dysphagia 11/17/2011    Past Surgical History:  Procedure Laterality Date   BLADDER SURGERY     ESOPHAGOGASTRODUODENOSCOPY  11/17/2011   Procedure: ESOPHAGOGASTRODUODENOSCOPY (EGD);  Surgeon: Milus Banister, MD;  Location: [place];  Service: Endoscopy;   Laterality: N/A;   ESOPHAGOGASTRODUODENOSCOPY  01/02/2012   Procedure: ESOPHAGOGASTRODUODENOSCOPY (EGD);  Surgeon: Milus Banister, MD;  Location: [place];  Service: Endoscopy;  Laterality: N/A;   ESOPHAGOGASTRODUODENOSCOPY N/A 01/22/2013   Procedure: ESOPHAGOGASTRODUODENOSCOPY (EGD);  Surgeon: Jerene Bears, MD;  Location: [place];  Service: Gastroenterology;  Laterality: N/A;   ESOPHAGOGASTRODUODENOSCOPY N/A 10/22/2013   Procedure: ESOPHAGOGASTRODUODENOSCOPY (EGD);  Surgeon: Ladene Artist, MD;  Location: [facility] ENDOSCOPY;  Service: Endoscopy;  Laterality: N/A;   HERNIA REPAIR     LAPAROSCOPIC GASTRIC BAND REMOVAL WITH LAPAROSCOPIC GASTRIC SLEEVE RESECTION  2016   LAPAROSCOPIC GASTRIC BANDING     TUBAL LIGATION     UPPER GASTROINTESTINAL ENDOSCOPY       OB History   No obstetric history on file.     Family History  Problem Relation Age of Onset   Breast cancer Mother    Diabetes Mother    COPD Mother    Heart disease Father        and Sister   Cirrhosis Father        Non-alcholic    Diabetes Father    Hypertension Father    Kidney disease Father        chronic kidney stones   Diabetes Sister    Diabetes Brother    Colon cancer Neg Hx     Social History   Tobacco Use   Smoking status: Former    Types: Cigarettes  Quit date: 11/16/2005    Years since quitting: 15.2   Smokeless tobacco: Never  Vaping Use   Vaping Use: Never used  Substance Use Topics   Alcohol use: Yes    Comment: occasion - once or twice a year   Drug use: No    Home Medications Prior to Admission medications   Medication Sig Start Date End Date Taking? Authorizing Provider  albuterol (PROVENTIL HFA;VENTOLIN HFA) 108 (90 BASE) MCG/ACT inhaler Inhale 2 puffs into the lungs every 6 (six) hours as needed for wheezing or shortness of breath.    [provider]  AMBULATORY NON FORMULARY MEDICATION Medication Name: tool softner + laxative 1 po daily    [provider]  calcium  carbonate (TUMS - DOSED IN MG ELEMENTAL CALCIUM) 500 MG chewable tablet Chew 2 tablets by mouth as needed.    [provider]  diazepam (VALIUM) 5 MG tablet Take 1 tablet by mouth as needed. 08/16/14   [provider]  doxycycline (VIBRAMYCIN) 100 MG capsule Take 1 capsule (100 mg total) by mouth 2 (two) times daily. 08/07/20   Scot Jun, FNP  escitalopram (LEXAPRO) 20 MG tablet Take 20 mg by mouth daily.    [provider]  ipratropium-albuterol (DUONEB) 0.5-2.5 (3) MG/3ML SOLN Inhale 3 mLs into the lungs every 6 (six) hours as needed.  09/10/12   [provider]  levocetirizine (XYZAL) 5 MG tablet Take 1 tablet (5 mg total) by mouth every evening. 08/07/20   Scot Jun, FNP  Liraglutide (VICTOZA) 18 MG/3ML SOPN Inject 1.8 mg into the skin daily.     [provider]  meclizine (ANTIVERT) 25 MG tablet Take 1 tablet by mouth as needed. 08/16/14   [provider]  metFORMIN (GLUCOPHAGE-XR) 500 MG 24 hr tablet Take 2 tablets by mouth daily with breakfast.    [provider]  nitrofurantoin, macrocrystal-monohydrate, (MACROBID) 100 MG capsule Take 1 capsule (100 mg total) by mouth 2 (two) times daily. 11/02/20   Faustino Congress, NP  pantoprazole (PROTONIX) 40 MG tablet Take 1 tablet (40 mg total) by mouth daily. 10/15/19   Levin Erp, PA  phentermine 37.5 MG capsule Take 37.5 mg by mouth every morning. 1/2 tab a day    [provider]  tiZANidine (ZANAFLEX) 2 MG tablet Take 2 mg by mouth every 6 (six) hours as needed for muscle spasms.    [provider]  Vitamin D, Ergocalciferol, (DRISDOL) 1.25 MG (50000 UT) CAPS capsule Take 1 capsule by mouth once a week. 12/31/18   [provider]    Allergies    Iodine, Sulfa antibiotics, Cephalexin, Ciprofloxacin hcl, and Penicillins  Review of Systems   Review of Systems  All other systems reviewed and are negative.  Physical Exam Updated Vital  Signs BP (!) 131/53 (BP Location: Right Arm)   Pulse (!) 58   Temp 97.7 F (36.5 C) (Oral)   Resp 18   Ht 5' 5.5" (1.664 m)   Wt 95.7 kg   LMP 07/30/2010   SpO2 97%   BMI 34.58 kg/m   Physical Exam Vitals and nursing note reviewed.  Constitutional:      General: She is not in acute distress.    Appearance: She is well-developed. She is not diaphoretic.  HENT:     Head: Normocephalic and atraumatic.  Cardiovascular:     Rate and Rhythm: Normal rate and regular rhythm.     Heart sounds: No murmur heard.  No friction rub. No gallop.  Pulmonary:     Effort: Pulmonary effort is normal. No respiratory distress.     Breath sounds: Normal breath sounds. No wheezing.  Abdominal:     General: Bowel sounds are normal. There is no distension.     Palpations: Abdomen is soft.     Tenderness: There is no abdominal tenderness.     Comments: There are multiple laparoscopic incision sites throughout the abdomen.  These all appear with Dermabond in place and no erythema, swelling, or drainage.  Musculoskeletal:        General: Normal range of motion.     Cervical back: Normal range of motion and neck supple.  Skin:    General: Skin is warm and dry.  Neurological:     General: No focal deficit present.     Mental Status: She is alert and oriented to person, place, and time.    ED Results / Procedures / Treatments   Labs (all labs ordered are listed, but only abnormal results are displayed) Labs Reviewed  COMPREHENSIVE METABOLIC PANEL  CBC WITH DIFFERENTIAL/PLATELET    EKG EKG Interpretation  Date/Time:  Friday February 03 2021 02:56:46 EDT Ventricular Rate:  60 PR Interval:  138 QRS Duration: 78 QT Interval:  442 QTC Calculation: 442 R Axis:   63 Text Interpretation: Normal sinus rhythm Low voltage QRS Borderline ECG Confirmed by Veryl Speak [id number] on 02/03/2021 3:22:49 AM  Radiology No results found.  Procedures Procedures   Medications Ordered in ED Medications   sodium chloride 0.9 % bolus 1,000 mL (has no administration in time range)  meclizine (ANTIVERT) tablet 25 mg (has no administration in time range)    ED Course  I have reviewed the triage vital signs and the nursing notes.  Pertinent labs & imaging results that were available during my care of the patient were reviewed by me and considered in my medical decision making (see chart for details).    MDM Rules/Calculators/A&P  Patient with recent gastric bypass surgery presenting with complaints of dizziness.  Patient arrives with stable vital signs and physical examination is unremarkable.  She is concerned she may be dehydrated, but vital signs and laboratory studies do not suggest this.  Patient was given meclizine and IV fluids and seems to be feeling somewhat better.  Her electrolytes are essentially unremarkable and she is not anemic.  At this point, discharge seems appropriate with follow-up as needed.  Final Clinical Impression(s) / ED Diagnoses Final diagnoses:  None    Rx / DC Orders ED Discharge Orders     None        Veryl Speak, MD 02/03/21 [phone]

## 2021-02-03 NOTE — ED Triage Notes (Addendum)
Pt states she had gastric bypass completed 8/24 in [place], [place]. Pt has been having dizziness for the past 2 days. Pt concerned for dehydration, or low blood sugar. Pt states sugars at home as been around 100. Pt called surgeon and advised to drink more water.

## 2021-03-07 ENCOUNTER — Other Ambulatory Visit (HOSPITAL_COMMUNITY): Payer: Self-pay | Admitting: Adult Health

## 2021-03-28 ENCOUNTER — Other Ambulatory Visit (HOSPITAL_COMMUNITY): Payer: Self-pay | Admitting: Adult Health

## 2021-03-29 ENCOUNTER — Other Ambulatory Visit (HOSPITAL_COMMUNITY): Payer: Self-pay | Admitting: General Practice

## 2021-03-29 ENCOUNTER — Other Ambulatory Visit (HOSPITAL_COMMUNITY): Payer: Self-pay | Admitting: Adult Health

## 2021-03-29 DIAGNOSIS — Z1231 Encounter for screening mammogram for malignant neoplasm of breast: Secondary | ICD-10-CM

## 2021-04-01 ENCOUNTER — Ambulatory Visit: Payer: Self-pay

## 2021-04-02 ENCOUNTER — Ambulatory Visit: Payer: Self-pay

## 2021-04-02 ENCOUNTER — Ambulatory Visit
Admission: EM | Admit: 2021-04-02 | Discharge: 2021-04-02 | Disposition: A | Discharge status: Home / Self Care | Payer: Medicare Other | Attending: Urgent Care | Admitting: Urgent Care

## 2021-04-02 ENCOUNTER — Encounter: Payer: Self-pay | Admitting: Emergency Medicine

## 2021-04-02 ENCOUNTER — Other Ambulatory Visit: Payer: Self-pay

## 2021-04-02 DIAGNOSIS — N3001 Acute cystitis with hematuria: Secondary | ICD-10-CM | POA: Diagnosis present

## 2021-04-02 DIAGNOSIS — E119 Type 2 diabetes mellitus without complications: Secondary | ICD-10-CM | POA: Diagnosis present

## 2021-04-02 LAB — POCT URINALYSIS DIP (MANUAL ENTRY)
Glucose, UA: NEGATIVE mg/dL
Ketones, POC UA: NEGATIVE mg/dL
Nitrite, UA: POSITIVE — AB
Protein Ur, POC: NEGATIVE mg/dL
Spec Grav, UA: 1.025 (ref 1.010–1.025)
Urobilinogen, UA: 1 E.U./dL
pH, UA: 5 (ref 5.0–8.0)

## 2021-04-02 MED ORDER — NITROFURANTOIN MONOHYD MACRO 100 MG PO CAPS: 100.0000 mg | ORAL_CAPSULE | Freq: Two times a day (BID) | ORAL | 0 refills | Status: DC

## 2021-04-02 NOTE — ED Triage Notes (Signed)
Pain on urination and chills since Wednesday.  Has been taking AZO.

## 2021-04-02 NOTE — ED Provider Notes (Signed)
[facility]   MRN: [id number] DOB: 06-04-1966  Subjective:   Terri [name] is a 55 y.o. female presenting for 4-day history of acute onset recurrent dysuria, urinary frequency, urinary urgency.  Patient admits that she has not been hydrating very well with water at all.  She has multiple allergies to antibiotics.  Has typically done well with Macrobid.  Does not drink alcohol, coffee, soda or sweet tea.  Has type 2 diabetes treated without insulin as she had a history of gastric bypass.  No current facility-administered medications for this encounter.  Current Outpatient Medications:    albuterol (PROVENTIL HFA;VENTOLIN HFA) 108 (90 BASE) MCG/ACT inhaler, Inhale 2 puffs into the lungs every 6 (six) hours as needed for wheezing or shortness of breath., Disp: , Rfl:    AMBULATORY NON FORMULARY MEDICATION, Medication Name: tool softner + laxative 1 po daily, Disp: , Rfl:    calcium carbonate (TUMS - DOSED IN MG ELEMENTAL CALCIUM) 500 MG chewable tablet, Chew 2 tablets by mouth as needed., Disp: , Rfl:    diazepam (VALIUM) 5 MG tablet, Take 1 tablet by mouth as needed., Disp: , Rfl:    doxycycline (VIBRAMYCIN) 100 MG capsule, Take 1 capsule (100 mg total) by mouth 2 (two) times daily., Disp: 20 capsule, Rfl: 0   escitalopram (LEXAPRO) 20 MG tablet, Take 20 mg by mouth daily., Disp: , Rfl:    ipratropium-albuterol (DUONEB) 0.5-2.5 (3) MG/3ML SOLN, Inhale 3 mLs into the lungs every 6 (six) hours as needed. , Disp: , Rfl:    levocetirizine (XYZAL) 5 MG tablet, Take 1 tablet (5 mg total) by mouth every evening., Disp: 30 tablet, Rfl: 0   liraglutide (VICTOZA) 18 MG/3ML SOPN, Inject 1.8 mg into the skin daily. , Disp: , Rfl:    meclizine (ANTIVERT) 25 MG tablet, Take 1 tablet (25 mg total) by mouth 3 (three) times daily as needed for dizziness., Disp: 15 tablet, Rfl: 0   metFORMIN (GLUCOPHAGE-XR) 500 MG 24 hr tablet, Take 2 tablets by mouth daily with breakfast., Disp: , Rfl:     nitrofurantoin, macrocrystal-monohydrate, (MACROBID) 100 MG capsule, Take 1 capsule (100 mg total) by mouth 2 (two) times daily., Disp: 10 capsule, Rfl: 0   pantoprazole (PROTONIX) 40 MG tablet, Take 1 tablet (40 mg total) by mouth daily., Disp: 30 tablet, Rfl: 11   phentermine 37.5 MG capsule, Take 37.5 mg by mouth every morning. 1/2 tab a day, Disp: , Rfl:    tiZANidine (ZANAFLEX) 2 MG tablet, Take 2 mg by mouth every 6 (six) hours as needed for muscle spasms., Disp: , Rfl:    Vitamin D, Ergocalciferol, (DRISDOL) 1.25 MG (50000 UT) CAPS capsule, Take 1 capsule by mouth once a week., Disp: , Rfl:    Allergies  Allergen Reactions   Iodine Rash    Topical only - not IV dye Topical only - not IV dye    Sulfa Antibiotics Other (See Comments)    "paralyzes me"   Cephalexin Itching and Rash   Ciprofloxacin Hcl Itching and Rash   Penicillins Itching and Rash    Past Medical History:  Diagnosis Date   Arthritis    Asthma    COPD (chronic obstructive pulmonary disease) ([place])    Diabetes mellitus    GERD (gastroesophageal reflux disease)    Obesity    Osteoporosis    Vertigo      Past Surgical History:  Procedure Laterality Date   BLADDER SURGERY     ESOPHAGOGASTRODUODENOSCOPY  11/17/2011  Procedure: ESOPHAGOGASTRODUODENOSCOPY (EGD);  Surgeon: Milus Banister, MD;  Location: [place];  Service: Endoscopy;  Laterality: N/A;   ESOPHAGOGASTRODUODENOSCOPY  01/02/2012   Procedure: ESOPHAGOGASTRODUODENOSCOPY (EGD);  Surgeon: Milus Banister, MD;  Location: [place];  Service: Endoscopy;  Laterality: N/A;   ESOPHAGOGASTRODUODENOSCOPY N/A 01/22/2013   Procedure: ESOPHAGOGASTRODUODENOSCOPY (EGD);  Surgeon: Jerene Bears, MD;  Location: [place];  Service: Gastroenterology;  Laterality: N/A;   ESOPHAGOGASTRODUODENOSCOPY N/A 10/22/2013   Procedure: ESOPHAGOGASTRODUODENOSCOPY (EGD);  Surgeon: Ladene Artist, MD;  Location: [facility] ENDOSCOPY;  Service: Endoscopy;  Laterality: N/A;   HERNIA  REPAIR     LAPAROSCOPIC GASTRIC BAND REMOVAL WITH LAPAROSCOPIC GASTRIC SLEEVE RESECTION  2016   LAPAROSCOPIC GASTRIC BANDING     TUBAL LIGATION     UPPER GASTROINTESTINAL ENDOSCOPY      Family History  Problem Relation Age of Onset   Breast cancer Mother    Diabetes Mother    COPD Mother    Heart disease Father        and Sister   Cirrhosis Father        Non-alcholic    Diabetes Father    Hypertension Father    Kidney disease Father        chronic kidney stones   Diabetes Sister    Diabetes Brother    Colon cancer Neg Hx     Social History   Tobacco Use   Smoking status: Former    Types: Cigarettes    Quit date: 11/16/2005    Years since quitting: 15.3   Smokeless tobacco: Never  Vaping Use   Vaping Use: Never used  Substance Use Topics   Alcohol use: Yes    Comment: occasion - once or twice a year   Drug use: No    ROS   Objective:   Vitals: BP 118/62 (BP Location: Right Arm)   Pulse 62   Temp 98 F (36.7 C) (Oral)   Resp 18   LMP 07/30/2010   SpO2 96%   Physical Exam Constitutional:      General: She is not in acute distress.    Appearance: Normal appearance. She is well-developed. She is not ill-appearing, toxic-appearing or diaphoretic.  HENT:     Head: Normocephalic and atraumatic.     Nose: Nose normal.     Mouth/Throat:     Mouth: Mucous membranes are moist.     Pharynx: Oropharynx is clear.  Eyes:     General: No scleral icterus.       Right eye: No discharge.        Left eye: No discharge.     Extraocular Movements: Extraocular movements intact.     Conjunctiva/sclera: Conjunctivae normal.     Pupils: Pupils are equal, round, and reactive to light.  Cardiovascular:     Rate and Rhythm: Normal rate.  Pulmonary:     Effort: Pulmonary effort is normal.  Abdominal:     General: Bowel sounds are normal. There is no distension.     Palpations: Abdomen is soft. There is no mass.     Tenderness: There is no abdominal tenderness. There is  no right CVA tenderness, left CVA tenderness, guarding or rebound.  Skin:    General: Skin is warm and dry.  Neurological:     General: No focal deficit present.     Mental Status: She is alert and oriented to person, place, and time.  Psychiatric:        Mood and Affect: Mood normal.  Behavior: Behavior normal.        Thought Content: Thought content normal.        Judgment: Judgment normal.    Results for orders placed or performed during the hospital encounter of 04/02/21 (from the past 24 hour(s))  POCT urinalysis dipstick     Status: Abnormal   Collection Time: 04/02/21 10:15 AM  Result Value Ref Range   Color, UA yellow yellow   Clarity, UA clear clear   Glucose, UA negative negative mg/dL   Bilirubin, UA small (A) negative   Ketones, POC UA negative negative mg/dL   Spec Grav, UA 1.025 1.010 - 1.025   Blood, UA trace-intact (A) negative   pH, UA 5.0 5.0 - 8.0   Protein Ur, POC negative negative mg/dL   Urobilinogen, UA 1.0 0.2 or 1.0 E.U./dL   Nitrite, UA Positive (A) Negative   Leukocytes, UA Trace (A) Negative    Assessment and Plan :   PDMP not reviewed this encounter.  1. Acute cystitis with hematuria   2. Type 2 diabetes mellitus treated without insulin ([place])    Start Macrobid given her multiple allergies to antibiotics to cover for acute cystitis, urine culture pending.  Recommended aggressive hydration, limiting urinary irritants. Counseled patient on potential for adverse effects with medications prescribed/recommended today, ER and return-to-clinic precautions discussed, patient verbalized understanding.    Jaynee Eagles, PA-C 04/02/21 1027

## 2021-04-02 NOTE — Discharge Instructions (Signed)

## 2021-04-04 LAB — URINE CULTURE: Culture: >=100000 — AB

## 2021-04-05 ENCOUNTER — Ambulatory Visit (HOSPITAL_COMMUNITY)
Admission: RE | Admit: 2021-04-05 | Discharge: 2021-04-05 | Disposition: A | Discharge status: Home / Self Care | Payer: Medicare Other | Source: Ambulatory Visit | Attending: Adult Health | Admitting: Adult Health

## 2021-04-05 ENCOUNTER — Other Ambulatory Visit: Payer: Self-pay

## 2021-04-05 DIAGNOSIS — Z1231 Encounter for screening mammogram for malignant neoplasm of breast: Secondary | ICD-10-CM | POA: Insufficient documentation

## 2021-05-31 ENCOUNTER — Emergency Department (HOSPITAL_COMMUNITY): Payer: Medicare Other

## 2021-05-31 ENCOUNTER — Other Ambulatory Visit: Payer: Self-pay

## 2021-05-31 ENCOUNTER — Emergency Department (HOSPITAL_COMMUNITY)
Admission: EM | Admit: 2021-05-31 | Discharge: 2021-06-01 | Disposition: A | Discharge status: Home / Self Care | Payer: Medicare Other | Attending: Emergency Medicine | Admitting: Emergency Medicine

## 2021-05-31 ENCOUNTER — Encounter (HOSPITAL_COMMUNITY): Payer: Self-pay | Admitting: *Deleted

## 2021-05-31 DIAGNOSIS — Z87891 Personal history of nicotine dependence: Secondary | ICD-10-CM | POA: Diagnosis not present

## 2021-05-31 DIAGNOSIS — I959 Hypotension, unspecified: Secondary | ICD-10-CM | POA: Insufficient documentation

## 2021-05-31 DIAGNOSIS — J449 Chronic obstructive pulmonary disease, unspecified: Secondary | ICD-10-CM | POA: Diagnosis not present

## 2021-05-31 DIAGNOSIS — R001 Bradycardia, unspecified: Secondary | ICD-10-CM | POA: Insufficient documentation

## 2021-05-31 DIAGNOSIS — E119 Type 2 diabetes mellitus without complications: Secondary | ICD-10-CM | POA: Insufficient documentation

## 2021-05-31 DIAGNOSIS — Z7984 Long term (current) use of oral hypoglycemic drugs: Secondary | ICD-10-CM | POA: Insufficient documentation

## 2021-05-31 DIAGNOSIS — Z79899 Other long term (current) drug therapy: Secondary | ICD-10-CM | POA: Insufficient documentation

## 2021-05-31 DIAGNOSIS — J45909 Unspecified asthma, uncomplicated: Secondary | ICD-10-CM | POA: Insufficient documentation

## 2021-05-31 DIAGNOSIS — Z20822 Contact with and (suspected) exposure to covid-19: Secondary | ICD-10-CM | POA: Diagnosis not present

## 2021-05-31 LAB — CBC WITH DIFFERENTIAL/PLATELET
Abs Immature Granulocytes: 0.02 10*3/uL (ref 0.00–0.07)
Basophils Absolute: 0 10*3/uL (ref 0.0–0.1)
Basophils Relative: 0 %
Eosinophils Absolute: 0.1 10*3/uL (ref 0.0–0.5)
Eosinophils Relative: 1 %
HCT: 37.8 % (ref 36.0–46.0)
Hemoglobin: 12.3 g/dL (ref 12.0–15.0)
Immature Granulocytes: 0 %
Lymphocytes Relative: 39 %
Lymphs Abs: 3.1 10*3/uL (ref 0.7–4.0)
MCH: 29.1 pg (ref 26.0–34.0)
MCHC: 32.5 g/dL (ref 30.0–36.0)
MCV: 89.4 fL (ref 80.0–100.0)
Monocytes Absolute: 0.4 10*3/uL (ref 0.1–1.0)
Monocytes Relative: 4 %
Neutro Abs: 4.4 10*3/uL (ref 1.7–7.7)
Neutrophils Relative %: 56 %
Platelets: 227 10*3/uL (ref 150–400)
RBC: 4.23 MIL/uL (ref 3.87–5.11)
RDW: 13.5 % (ref 11.5–15.5)
WBC: 7.9 10*3/uL (ref 4.0–10.5)
nRBC: 0 % (ref 0.0–0.2)

## 2021-05-31 MED ORDER — LACTATED RINGERS IV BOLUS
2000.0000 mL | Freq: Once | INTRAVENOUS | Status: AC
  Administered 2021-05-31: 2000 mL via INTRAVENOUS

## 2021-05-31 NOTE — ED Triage Notes (Signed)
Pt took hydrocodone about an hour ago and muscle relaxer, recent abd surgery.  Pt with hypotension noted in triage.

## 2021-06-01 ENCOUNTER — Ambulatory Visit (HOSPITAL_COMMUNITY)
Admission: RE | Admit: 2021-06-01 | Discharge: 2021-06-01 | Disposition: A | Discharge status: Home / Self Care | Payer: Medicare Other | Source: Ambulatory Visit | Attending: Adult Health | Admitting: Adult Health

## 2021-06-01 ENCOUNTER — Other Ambulatory Visit (HOSPITAL_COMMUNITY): Payer: Self-pay | Admitting: Adult Health

## 2021-06-01 DIAGNOSIS — I959 Hypotension, unspecified: Secondary | ICD-10-CM | POA: Diagnosis not present

## 2021-06-01 DIAGNOSIS — M545 Low back pain, unspecified: Secondary | ICD-10-CM

## 2021-06-01 LAB — RAPID URINE DRUG SCREEN, HOSP PERFORMED
Amphetamines: NOT DETECTED
Barbiturates: NOT DETECTED
Benzodiazepines: NOT DETECTED
Cocaine: NOT DETECTED
Opiates: POSITIVE — AB
Tetrahydrocannabinol: NOT DETECTED

## 2021-06-01 LAB — COMPREHENSIVE METABOLIC PANEL
ALT: 19 U/L (ref 0–44)
AST: 22 U/L (ref 15–41)
Albumin: 3.5 g/dL (ref 3.5–5.0)
Alkaline Phosphatase: 100 U/L (ref 38–126)
Anion gap: 7 (ref 5–15)
BUN: 14 mg/dL (ref 6–20)
CO2: 26 mmol/L (ref 22–32)
Calcium: 8.7 mg/dL — ABNORMAL LOW (ref 8.9–10.3)
Chloride: 106 mmol/L (ref 98–111)
Creatinine, Ser: 0.88 mg/dL (ref 0.44–1.00)
GFR, Estimated: >60 mL/min (ref >60)
Glucose, Bld: 210 mg/dL — ABNORMAL HIGH (ref 70–99)
Potassium: 4.2 mmol/L (ref 3.5–5.1)
Sodium: 139 mmol/L (ref 135–145)
Total Bilirubin: 0.6 mg/dL (ref 0.3–1.2)
Total Protein: 6.5 g/dL (ref 6.5–8.1)

## 2021-06-01 LAB — SALICYLATE LEVEL: Salicylate Lvl: <7 mg/dL — ABNORMAL LOW (ref 7.0–30.0)

## 2021-06-01 LAB — ACETAMINOPHEN LEVEL: Acetaminophen (Tylenol), Serum: <10 ug/mL — ABNORMAL LOW (ref 10–30)

## 2021-06-01 LAB — URINALYSIS, ROUTINE W REFLEX MICROSCOPIC
Bilirubin Urine: NEGATIVE
Glucose, UA: 50 mg/dL — AB
Hgb urine dipstick: NEGATIVE
Ketones, ur: NEGATIVE mg/dL
Leukocytes,Ua: NEGATIVE
Nitrite: NEGATIVE
Protein, ur: NEGATIVE mg/dL
Specific Gravity, Urine: 1.021 (ref 1.005–1.030)
pH: 5 (ref 5.0–8.0)

## 2021-06-01 LAB — RESP PANEL BY RT-PCR (FLU A&B, COVID) ARPGX2
Influenza A by PCR: NEGATIVE
Influenza B by PCR: NEGATIVE
SARS Coronavirus 2 by RT PCR: NEGATIVE

## 2021-06-01 LAB — LACTIC ACID, PLASMA
Lactic Acid, Venous: 2.2 mmol/L (ref 0.5–1.9)
Lactic Acid, Venous: 2.7 mmol/L (ref 0.5–1.9)

## 2021-06-01 MED ORDER — LACTATED RINGERS IV BOLUS
1000.0000 mL | Freq: Once | INTRAVENOUS | Status: AC
  Administered 2021-06-01: 01:00:00 1000 mL via INTRAVENOUS

## 2021-06-01 NOTE — ED Provider Notes (Signed)
Terri [name]   CSN: [id number] Arrival date & time: 05/31/21  2240     History Chief Complaint  Patient presents with   Hypotension    Terri [name] is a 55 y.o. female.  The history is provided by the patient and the spouse.  Weakness Severity:  Moderate Onset quality:  Gradual Timing:  Constant Progression:  Unchanged Chronicity:  New Context comment:  Hydrocodone Relieved by:  None tried Worsened by:  Nothing Associated symptoms: dizziness   Associated symptoms: no abdominal pain, no chest pain, no falls, no fever, no headaches, no loss of consciousness, no shortness of breath, no stroke symptoms and no vomiting   Patient with history of asthma, COPD, diabetes presents with generalized weakness Patient reports she has been having low back pain for up to a month.  She reports due to back pain, she took a Zanaflex and 10 mg hydrocodone Soon after she began feeling generalized weakness but no syncope.  They checked her blood pressure at home and it was in the low 90s. Denies any fevers or vomiting.  No focal weakness just feels fatigued.  She reports due to previous gastric bypass she is exquisitely sensitive to medication No other new medications.  She does not take any new cardiac meds. She reports the back pain is been ongoing for up to a month.  It started after significant coughing.  No new leg weakness.  No incontinence.    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   Chest pain 08/19/2012   COPD exacerbation ([place]) 08/19/2012   Diabetes ([place]) 08/19/2012   Lapband in [facility] 2009 02/13/2012   Nonspecific (abnormal) findings on radiological and other examination of gastrointestinal tract 01/02/2012   Foreign body in stomach 01/02/2012   Dysphagia 11/17/2011    Past Surgical History:  Procedure Laterality Date   BLADDER SURGERY     ESOPHAGOGASTRODUODENOSCOPY  11/17/2011   Procedure: ESOPHAGOGASTRODUODENOSCOPY (EGD);  Surgeon: Milus Banister, MD;  Location: [place];  Service: Endoscopy;  Laterality: N/A;   ESOPHAGOGASTRODUODENOSCOPY  01/02/2012   Procedure: ESOPHAGOGASTRODUODENOSCOPY (EGD);  Surgeon: Milus Banister, MD;  Location: [place];  Service: Endoscopy;  Laterality: N/A;   ESOPHAGOGASTRODUODENOSCOPY N/A 01/22/2013   Procedure: ESOPHAGOGASTRODUODENOSCOPY (EGD);  Surgeon: Jerene Bears, MD;  Location: [place];  Service: Gastroenterology;  Laterality: N/A;   ESOPHAGOGASTRODUODENOSCOPY N/A 10/22/2013   Procedure: ESOPHAGOGASTRODUODENOSCOPY (EGD);  Surgeon: Ladene Artist, MD;  Location: [facility] ENDOSCOPY;  Service: Endoscopy;  Laterality: N/A;   HERNIA REPAIR     LAPAROSCOPIC GASTRIC BAND REMOVAL WITH LAPAROSCOPIC GASTRIC SLEEVE RESECTION  2016   LAPAROSCOPIC GASTRIC BANDING     TUBAL LIGATION     UPPER GASTROINTESTINAL ENDOSCOPY       OB History   No obstetric history on file.     Family History  Problem Relation Age of Onset   Breast cancer Mother    Diabetes Mother  COPD Mother    Heart disease Father        and Sister   Cirrhosis Father        Non-alcholic    Diabetes Father    Hypertension Father    Kidney disease Father        chronic kidney stones   Diabetes Sister    Diabetes Brother    Colon cancer Neg Hx     Social History   Tobacco Use   Smoking status: Former    Types: Cigarettes    Quit date: 11/16/2005    Years since quitting: 15.5   Smokeless tobacco:  Never  Vaping Use   Vaping Use: Never used  Substance Use Topics   Alcohol use: Yes    Comment: occasion - once or twice a year   Drug use: No    Home Medications Prior to Admission medications   Medication Sig Start Date End Date Taking? Authorizing Provider  albuterol (PROVENTIL HFA;VENTOLIN HFA) 108 (90 BASE) MCG/ACT inhaler Inhale 2 puffs into the lungs every 6 (six) hours as needed for wheezing or shortness of breath.    [provider]  AMBULATORY NON FORMULARY MEDICATION Medication Name: tool softner + laxative 1 po daily    [provider]  calcium carbonate (TUMS - DOSED IN MG ELEMENTAL CALCIUM) 500 MG chewable tablet Chew 2 tablets by mouth as needed.    [provider]  diazepam (VALIUM) 5 MG tablet Take 1 tablet by mouth as needed. 08/16/14   [provider]  doxycycline (VIBRAMYCIN) 100 MG capsule Take 1 capsule (100 mg total) by mouth 2 (two) times daily. 08/07/20   Scot Jun, FNP  escitalopram (LEXAPRO) 20 MG tablet Take 20 mg by mouth daily.    [provider]  ipratropium-albuterol (DUONEB) 0.5-2.5 (3) MG/3ML SOLN Inhale 3 mLs into the lungs every 6 (six) hours as needed.  09/10/12   [provider]  levocetirizine (XYZAL) 5 MG tablet Take 1 tablet (5 mg total) by mouth every evening. 08/07/20   Scot Jun, FNP  liraglutide (VICTOZA) 18 MG/3ML SOPN Inject 1.8 mg into the skin daily.     [provider]  meclizine (ANTIVERT) 25 MG tablet Take 1 tablet (25 mg total) by mouth 3 (three) times daily as needed for dizziness. 02/03/21   Veryl Speak, MD  metFORMIN (GLUCOPHAGE-XR) 500 MG 24 hr tablet Take 2 tablets by mouth daily with breakfast.    [provider]  nitrofurantoin, macrocrystal-monohydrate, (MACROBID) 100 MG capsule Take 1 capsule (100 mg total) by mouth 2 (two) times daily. 04/02/21   Jaynee Eagles, PA-C  pantoprazole (PROTONIX) 40 MG tablet Take 1 tablet (40 mg total) by mouth daily.  10/15/19   Levin Erp, PA  phentermine 37.5 MG capsule Take 37.5 mg by mouth every morning. 1/2 tab a day    [provider]  tiZANidine (ZANAFLEX) 2 MG tablet Take 2 mg by mouth every 6 (six) hours as needed for muscle spasms.    [provider]  Vitamin D, Ergocalciferol, (DRISDOL) 1.25 MG (50000 UT) CAPS capsule Take 1 capsule by mouth once a week. 12/31/18   [provider]    Allergies    Iodine, Sulfa antibiotics, Cephalexin, Ciprofloxacin hcl, and Penicillins  Review of Systems   Review of Systems  Constitutional:  Negative for fever.  Respiratory:  Negative for shortness of breath.   Cardiovascular:  Negative for chest pain.  Gastrointestinal:  Positive for constipation. Negative  for abdominal pain and vomiting.  Genitourinary:  Positive for decreased urine volume.       Denies incontinence   Musculoskeletal:  Negative for falls.  Neurological:  Positive for dizziness and weakness. Negative for loss of consciousness, syncope and headaches.  All other systems reviewed and are negative.  Physical Exam Updated Vital Signs BP 94/69    Pulse (!) 49    Temp (!) 96.8 F (36 C) (Temporal)    Resp (!) 22    Ht 1.676 m (5\' 6" )    Wt 81.6 kg    LMP 07/30/2010    SpO2 99%    BMI 29.05 kg/m   Physical Exam CONSTITUTIONAL: Well developed/well nourished HEAD: Normocephalic/atraumatic EYES: EOMI/PERRL ENMT: Mucous membranes moist NECK: supple no meningeal signs SPINE/BACK: No cervical thoracic tenderness, lower lumbar tenderness, no bruising/crepitance/stepoffs noted to spine CV: S1/S2 noted, no murmurs/rubs/gallops noted LUNGS: Lungs are clear to auscultation bilaterally, no apparent distress ABDOMEN: soft, nontender, no rebound or guarding GU:no cva tenderness NEURO: Awake/alert, equal motor 5/5 strength noted with the following: hip flexion/knee flexion/extension, foot dorsi/plantar flexion, great toe extension intact bilaterally,  Pt is able to  ambulate unassisted. EXTREMITIES: pulses normal, full ROM SKIN: warm, color normal PSYCH: no abnormalities of mood noted, alert and oriented to situation  ED Results / Procedures / Treatments   Labs (all labs ordered are listed, but only abnormal results are displayed) Labs Reviewed  LACTIC ACID, PLASMA - Abnormal; Notable for the following components:      Result Value   Lactic Acid, Venous 2.7 (*)    All other components within normal limits  LACTIC ACID, PLASMA - Abnormal; Notable for the following components:   Lactic Acid, Venous 2.2 (*)    All other components within normal limits  COMPREHENSIVE METABOLIC PANEL - Abnormal; Notable for the following components:   Glucose, Bld 210 (*)    Calcium 8.7 (*)    All other components within normal limits  URINALYSIS, ROUTINE W REFLEX MICROSCOPIC - Abnormal; Notable for the following components:   Color, Urine AMBER (*)    APPearance HAZY (*)    Glucose, UA 50 (*)    All other components within normal limits  ACETAMINOPHEN LEVEL - Abnormal; Notable for the following components:   Acetaminophen (Tylenol), Serum <10 (*)    All other components within normal limits  SALICYLATE LEVEL - Abnormal; Notable for the following components:   Salicylate Lvl <1.0 (*)    All other components within normal limits  RAPID URINE DRUG SCREEN, HOSP PERFORMED - Abnormal; Notable for the following components:   Opiates POSITIVE (*)    All other components within normal limits  RESP PANEL BY RT-PCR (FLU A&B, COVID) ARPGX2  CBC WITH DIFFERENTIAL/PLATELET    EKG EKG Interpretation  Date/Time:  Wednesday May 31 2021 23:12:35 EST Ventricular Rate:  41 PR Interval:  139 QRS Duration: 92 QT Interval:  494 QTC Calculation: 408 R Axis:   54 Text Interpretation: Sinus bradycardia Low voltage, extremity and precordial leads Confirmed by Ripley Fraise [phone]) on 05/31/2021 11:37:20 PM  Radiology DG Chest Port 1 View  Result Date:  05/31/2021 CLINICAL DATA:  Recent abdominal surgery, hypotension, weakness EXAM: PORTABLE CHEST 1 VIEW COMPARISON:  12/07/2020 FINDINGS: The heart size and mediastinal contours are within normal limits. Both lungs are clear. The visualized skeletal structures are unremarkable. IMPRESSION: No active disease. Electronically Signed   By: Randa Ngo M.D.   On: 05/31/2021 23:40    Procedures .Critical Care  Performed by: Ripley Fraise, MD Authorized by: Ripley Fraise, MD   Critical care provider statement:    Critical care time (minutes):  60   Critical care start time:  06/01/2021 12:25 AM   Critical care end time:  06/01/2021 1:25 AM   Critical care time was exclusive of:  Separately billable procedures and treating other patients   Critical care was necessary to treat or prevent imminent or life-threatening deterioration of the following conditions:  Sepsis, shock and dehydration   Critical care was time spent personally by me on the following activities:  Development of treatment plan with patient or surrogate, evaluation of patient's response to treatment, examination of patient, re-evaluation of patient's condition, pulse oximetry, ordering and review of radiographic studies, ordering and review of laboratory studies, ordering and performing treatments and interventions and obtaining history from patient or surrogate   I assumed direction of critical care for this patient from another provider in my specialty: no     Medications Ordered in ED Medications  lactated ringers bolus 2,000 mL (0 mLs Intravenous Stopped 06/01/21 0230)  lactated ringers bolus 1,000 mL (0 mLs Intravenous Stopped 06/01/21 0300)    ED Course  I have reviewed the triage vital signs and the nursing notes.  Pertinent labs & imaging results that were available during my care of the patient were reviewed by me and considered in my medical decision making (see chart for details).    MDM Rules/Calculators/A&P                          This patient presents to the ED for concern of weakness, this involves an extensive number of treatment options, and is a complaint that carries with it a high risk of complications and morbidity.  The differential diagnosis includes CVA, dehydration, sepsis, renal failure, metabolic derangement, overdose   Additional history obtained:  Additional history obtained from spouse External records from outside source obtained and reviewed including previous surgical record   Lab Tests:  I Ordered, reviewed, and interpreted labs.  The pertinent results include: Mildly elevated lactate   Imaging Studies ordered:  I ordered imaging studies including chest x-ray I independently visualized and interpreted imaging which showed no acute findings I agree with the radiologist interpretation   Cardiac Monitoring:  The patient was maintained on a cardiac monitor.  I personally viewed and interpreted the cardiac monitored which showed an underlying rhythm of: Sinus bradycardia   Medicines ordered and prescription drug management:  I ordered medication including IV fluid for hypotension Reevaluation of the patient after these medicines showed that the patient improved I have reviewed the patients home medicines and have made adjustments as needed   Critical Interventions:  IV fluids     Reevaluation:  After the interventions noted above, I reevaluated the patient and found that they have :improved   Dispostion:  After consideration of the diagnostic results and the patients response to treatment feel that the patent would benefit from discharge home.    4:03 AM Patient presented with generalized weakness after taking Zanaflex and 10 mg hydrocodone. She was initially hypotensive that quickly resolved with IV fluids.  She had elevated lactate that began to clear after fluids, I have low suspicion for sepsis She was bradycardic throughout most of her stay, but  is now in the 50s on repeat EKG  EKG Interpretation  Date/Time:  Thursday June 01 2021 03:21:51 EST Ventricular Rate:  59 PR Interval:  141  QRS Duration: 94 QT Interval:  490 QTC Calculation: 486 R Axis:   54 Text Interpretation: Sinus rhythm Low voltage, extremity and precordial leads Borderline prolonged QT interval HR is improved Confirmed by Ripley Fraise [phone]) on 06/01/2021 3:45:10 AM       I suspect her issues are related to the medications. Patient is able to ambulate.  She is somewhat limited due to her back pain she has had for a month.  She has no focal weakness or incontinence reported with her back pain.  She has PCP follow-up later today for her back pain.  Final Clinical Impression(s) / ED Diagnoses Final diagnoses:  Hypotension, unspecified hypotension type  Bradycardia    Rx / DC Orders ED Discharge Orders     None        Ripley Fraise, MD 06/01/21 [phone]

## 2021-06-01 NOTE — ED Notes (Signed)
Pt was able to ambulate to rest room with little assistance. Complains of back pain 10/10 when repositioning to ambulate.

## 2021-06-01 NOTE — Discharge Instructions (Signed)

## 2021-06-08 ENCOUNTER — Other Ambulatory Visit: Payer: Self-pay | Admitting: Nurse Practitioner

## 2021-06-08 ENCOUNTER — Other Ambulatory Visit (HOSPITAL_COMMUNITY): Payer: Self-pay | Admitting: Nurse Practitioner

## 2021-06-08 DIAGNOSIS — M545 Low back pain, unspecified: Secondary | ICD-10-CM

## 2021-06-13 ENCOUNTER — Ambulatory Visit: Payer: Medicare Other | Admitting: Orthopaedic Surgery

## 2021-06-20 ENCOUNTER — Other Ambulatory Visit: Payer: Self-pay

## 2021-06-20 ENCOUNTER — Ambulatory Visit (HOSPITAL_COMMUNITY)
Admission: RE | Admit: 2021-06-20 | Discharge: 2021-06-20 | Disposition: A | Discharge status: Home / Self Care | Payer: Medicare Other | Source: Ambulatory Visit | Attending: Nurse Practitioner | Admitting: Nurse Practitioner

## 2021-06-20 DIAGNOSIS — M545 Low back pain, unspecified: Secondary | ICD-10-CM | POA: Diagnosis present

## 2021-07-10 ENCOUNTER — Ambulatory Visit (INDEPENDENT_AMBULATORY_CARE_PROVIDER_SITE_OTHER): Payer: Medicare Other | Admitting: Urology

## 2021-07-10 ENCOUNTER — Encounter: Payer: Self-pay | Admitting: Urology

## 2021-07-10 ENCOUNTER — Other Ambulatory Visit: Payer: Self-pay

## 2021-07-10 VITALS — BP 122/77 | HR 79 | Wt 182.0 lb

## 2021-07-10 DIAGNOSIS — R8290 Unspecified abnormal findings in urine: Secondary | ICD-10-CM | POA: Diagnosis not present

## 2021-07-10 DIAGNOSIS — N390 Urinary tract infection, site not specified: Secondary | ICD-10-CM | POA: Insufficient documentation

## 2021-07-10 LAB — MICROSCOPIC EXAMINATION
RBC, Urine: NONE SEEN /hpf (ref 0–2)
Renal Epithel, UA: NONE SEEN /hpf

## 2021-07-10 LAB — URINALYSIS, ROUTINE W REFLEX MICROSCOPIC
Bilirubin, UA: NEGATIVE
Glucose, UA: NEGATIVE
Nitrite, UA: POSITIVE — AB
Protein,UA: NEGATIVE
RBC, UA: NEGATIVE
Specific Gravity, UA: >1.03 — ABNORMAL HIGH (ref 1.005–1.030)
Urobilinogen, Ur: 1 mg/dL (ref 0.2–1.0)
pH, UA: 5.5 (ref 5.0–7.5)

## 2021-07-10 MED ORDER — NITROFURANTOIN MONOHYD MACRO 100 MG PO CAPS: 100.0000 mg | ORAL_CAPSULE | Freq: Two times a day (BID) | ORAL | 0 refills | Status: DC

## 2021-07-10 MED ORDER — NITROFURANTOIN MONOHYD MACRO 100 MG PO CAPS: 100.0000 mg | ORAL_CAPSULE | Freq: Two times a day (BID) | ORAL | 1 refills | Status: AC

## 2021-07-10 NOTE — Progress Notes (Signed)
Urological Symptom Review  Patient is experiencing the following symptoms: Urinary tract infection   Review of Systems  Gastrointestinal (upper)  : Negative for upper GI symptoms  Gastrointestinal (lower) : Constipation  Constitutional : Negative for symptoms  Skin: Negative for skin symptoms  Eyes: Negative for eye symptoms  Ear/Nose/Throat : Negative for Ear/Nose/Throat symptoms  Hematologic/Lymphatic: Negative for Hematologic/Lymphatic symptoms  Cardiovascular : Negative for cardiovascular symptoms  Respiratory : Negative for respiratory symptoms  Endocrine: Negative for endocrine symptoms  Musculoskeletal: Back pain  Neurological: Negative for neurological symptoms  Psychologic: Negative for psychiatric symptoms

## 2021-07-10 NOTE — Progress Notes (Signed)
Assessment: 1. Frequent UTI   2. Abnormal urine findings    Plan: I reviewed the patient's records from [facility] clinic as well as urine culture results from [place]. Methods to reduce the risk of UTIs discussed with the patient including increased fluid intake, timed and double voiding, daily cranberry supplement, and daily probiotic. Urine culture sent today. Prescription for Macrobid 100 mg twice daily x7 days provided. We will contact her with results of the culture. We discussed self-directed therapy for management of her frequent UTIs.  I stressed the importance of obtaining a urine culture prior to treatment if at all possible. Return to office in 6 weeks.  Chief Complaint:  Chief Complaint  Patient presents with   Recurrent UTI    History of Present Illness:  Terri [name] is a 56 y.o. year old female who is seen in consultation from Jani Gravel, MD for evaluation of frequent UTI's.  She has a history of UTIs every 2-3 months for the past 2 years.  Typical UTI symptoms include urgency and dysuria.  No gross hematuria.  No fevers, chills, or flank pain.  Her symptoms typically resolve with antibiotic therapy.  She typically takes Macrodantin.  She was last treated for a UTI approximately 1 month ago.  No current UTI symptoms.  No significant urinary symptoms at baseline.  She does report some occasional leakage of urine after voiding.  No history of kidney stones.  No recent imaging studies.  Urine culture results: 09-30-22 <10K colonies 6/22 <10K colonies 11/22 >100K E. Coli  She does have constipation.  She has a history of a bladder tack in number of years ago.  Past Medical History:  Past Medical History:  Diagnosis Date   Arthritis    Asthma    COPD (chronic obstructive pulmonary disease) ([place])    Diabetes mellitus    GERD (gastroesophageal reflux disease)    Obesity    Osteoporosis    Vertigo     Past Surgical History:  Past Surgical History:  Procedure  Laterality Date   BLADDER SURGERY     ESOPHAGOGASTRODUODENOSCOPY  11/17/2011   Procedure: ESOPHAGOGASTRODUODENOSCOPY (EGD);  Surgeon: Milus Banister, MD;  Location: [place];  Service: Endoscopy;  Laterality: N/A;   ESOPHAGOGASTRODUODENOSCOPY  01/02/2012   Procedure: ESOPHAGOGASTRODUODENOSCOPY (EGD);  Surgeon: Milus Banister, MD;  Location: [place];  Service: Endoscopy;  Laterality: N/A;   ESOPHAGOGASTRODUODENOSCOPY N/A 01/22/2013   Procedure: ESOPHAGOGASTRODUODENOSCOPY (EGD);  Surgeon: Jerene Bears, MD;  Location: [place];  Service: Gastroenterology;  Laterality: N/A;   ESOPHAGOGASTRODUODENOSCOPY N/A 10/22/2013   Procedure: ESOPHAGOGASTRODUODENOSCOPY (EGD);  Surgeon: Ladene Artist, MD;  Location[facility] ENDOSCOPY;  Service: Endoscopy;  Laterality: N/A;   HERNIA REPAIR     LAPAROSCOPIC GASTRIC BAND REMOVAL WITH LAPAROSCOPIC GASTRIC SLEEVE RESECTION  2016   LAPAROSCOPIC GASTRIC BANDING     TUBAL LIGATION     UPPER GASTROINTESTINAL ENDOSCOPY      Allergies:  Allergies  Allergen Reactions   Iodine Rash    Topical only - not IV dye Topical only - not IV dye    Sulfa Antibiotics Other (See Comments)    "paralyzes me"   Cephalexin Itching and Rash   Ciprofloxacin Hcl Itching and Rash   Penicillins Itching and Rash    Family History:  Family History  Problem Relation Age of Onset   Breast cancer Mother    Diabetes Mother    COPD Mother    Heart disease Father  and Sister   Cirrhosis Father        Non-alcholic    Diabetes Father    Hypertension Father    Kidney disease Father        chronic kidney stones   Diabetes Sister    Diabetes Brother    Colon cancer Neg Hx     Social History:  Social History   Tobacco Use   Smoking status: Former    Types: Cigarettes    Quit date: 11/16/2005    Years since quitting: 15.6   Smokeless tobacco: Never  Vaping Use   Vaping Use: Never used  Substance Use Topics   Alcohol use: Yes    Comment: occasion - once or  twice a year   Drug use: No    Review of symptoms:  Constitutional:  Negative for unexplained weight loss, night sweats, fever, chills ENT:  Negative for nose bleeds, sinus pain, painful swallowing CV:  Negative for chest pain, shortness of breath, exercise intolerance, palpitations, loss of consciousness Resp:  Negative for cough, wheezing, shortness of breath GI:  Negative for nausea, vomiting, diarrhea, bloody stools GU:  Positives noted in HPI; otherwise negative for gross hematuria, dysuria Neuro:  Negative for seizures, poor balance, limb weakness, slurred speech Psych:  Negative for lack of energy, depression, anxiety Endocrine:  Negative for polydipsia, polyuria, symptoms of hypoglycemia (dizziness, hunger, sweating) Hematologic:  Negative for anemia, purpura, petechia, prolonged or excessive bleeding, use of anticoagulants  Allergic:  Negative for difficulty breathing or choking as a result of exposure to anything; no shellfish allergy; no allergic response (rash/itch) to materials, foods  Physical exam: BP 122/77    Pulse 79    Wt 182 lb (82.6 kg)    LMP 07/30/2010    BMI 29.38 kg/m  GENERAL APPEARANCE:  Well appearing, well developed, well nourished, NAD HEENT: Atraumatic, Normocephalic, oropharynx clear. NECK: Supple without lymphadenopathy or thyromegaly. LUNGS: Clear to auscultation bilaterally. HEART: Regular Rate and Rhythm without murmurs, gallops, or rubs. ABDOMEN: Soft, non-tender, No Masses. EXTREMITIES: Moves all extremities well.  Without clubbing, cyanosis, or edema. NEUROLOGIC:  Alert and oriented x 3, normal gait, CN II-XII grossly intact.  MENTAL STATUS:  Appropriate. BACK:  Non-tender to palpation.  No CVAT SKIN:  Warm, dry and intact.    Results: U/A: 11-30 WBCs, many bacteria, nitrite positive  PVR: 0 mL

## 2021-07-14 ENCOUNTER — Other Ambulatory Visit: Payer: Self-pay | Admitting: Urology

## 2021-07-15 LAB — URINE CULTURE

## 2021-07-17 ENCOUNTER — Telehealth: Payer: Self-pay

## 2021-07-17 NOTE — Telephone Encounter (Signed)
Phone was forwarded to VM. A message was left for her to call the office regarding her UC.

## 2021-07-17 NOTE — Telephone Encounter (Signed)
-----   Message from Reynaldo Minium, [place] sent at 07/17/2021 12:23 PM EST ----- Culture indicates Macrobid should cover well. Pt needs to complete full 7 days and keep FU with Dr. Felipa Eth as scheduled. ----- Message ----- From: Lavone Neri Lab Results In Sent: 07/10/2021   5:03 PM EST To: Primus Bravo, MD

## 2021-07-18 ENCOUNTER — Telehealth: Payer: Self-pay

## 2021-07-18 NOTE — Telephone Encounter (Signed)
Patient called and notified. Patient will take macrobid as ordered.  Patient will return call to office 3-5 days after completing medication if her symptoms are still present.

## 2021-07-18 NOTE — Telephone Encounter (Signed)
-----   Message from Reynaldo Minium, [place] sent at 07/17/2021 11:41 AM EST ----- Please let pt know that Macrobid is appropriate per her culture results. She needs to complete the Rx and FU with Dr. Felipa Eth as scheduled. ----- Message ----- From: Dorisann Frames, RN Sent: 07/14/2021  12:39 PM EST To: Primus Bravo, MD  Urine culture

## 2021-07-31 ENCOUNTER — Other Ambulatory Visit: Payer: Medicaid Other

## 2021-08-21 ENCOUNTER — Ambulatory Visit: Payer: Medicaid Other | Admitting: Urology

## 2021-08-22 ENCOUNTER — Ambulatory Visit (INDEPENDENT_AMBULATORY_CARE_PROVIDER_SITE_OTHER): Payer: Medicare Other | Admitting: Urology

## 2021-08-22 ENCOUNTER — Encounter: Payer: Self-pay | Admitting: Urology

## 2021-08-22 ENCOUNTER — Other Ambulatory Visit: Payer: Self-pay

## 2021-08-22 VITALS — BP 101/57 | HR 69

## 2021-08-22 DIAGNOSIS — N390 Urinary tract infection, site not specified: Secondary | ICD-10-CM | POA: Diagnosis not present

## 2021-08-22 MED ORDER — NITROFURANTOIN MONOHYD MACRO 100 MG PO CAPS
100.0000 mg | ORAL_CAPSULE | Freq: Two times a day (BID) | ORAL | 2 refills | Status: AC
Start: 2021-08-22 — End: 2021-08-29

## 2021-08-22 NOTE — Progress Notes (Signed)
? ?Assessment: ?1. Frequent UTI   ? ? ?Plan: ?Continue methods to reduce the risk of UTIs including increased fluid intake, timed and double voiding, daily cranberry supplement, and daily probiotic. ?Prescription for Macrobid 100 mg twice daily x7 days provided. ?Continue self-directed therapy for management of her frequent UTIs.  I stressed the importance of obtaining a urine culture prior to treatment if at all possible. ?Return to office in 3 months ? ?Chief Complaint:  ?Chief Complaint  ?Patient presents with  ? Recurrent UTI  ? ? ?History of Present Illness: ? ?Terri [name] is a 56 y.o. year old female who is seen for further evaluation of frequent UTI's.  She has a history of UTIs every 2-3 months for the past 2 years.  Typical UTI symptoms include urgency and dysuria.  No gross hematuria.  No fevers, chills, or flank pain.  Her symptoms typically resolve with antibiotic therapy.  She typically takes Macrodantin.  She was last treated for a UTI approximately 1 month ago.  No current UTI symptoms.  No significant urinary symptoms at baseline.  She does report some occasional leakage of urine after voiding.  No history of kidney stones.  No recent imaging studies. ? ?Urine culture results: ?4/22 <10K colonies ?6/22 <10K colonies ?11/22 >100K E. Coli ?2/23 >100K E. Coli.  Treated with Macrobid x 7 days. ? ?She returns today for follow-up.  She is not having any UTI symptoms at the present time.  No dysuria or gross hematuria. ? ? ?Portions of the above documentation were copied from a prior visit for review purposes only. ? ? ?Past Medical History:  ?Past Medical History:  ?Diagnosis Date  ? Arthritis   ? Asthma   ? COPD (chronic obstructive pulmonary disease) ([place])   ? Diabetes mellitus   ? GERD (gastroesophageal reflux disease)   ? Obesity   ? Osteoporosis   ? Vertigo   ? ? ?Past Surgical History:  ?Past Surgical History:  ?Procedure Laterality Date  ? BLADDER SURGERY    ? ESOPHAGOGASTRODUODENOSCOPY   11/17/2011  ? Procedure: ESOPHAGOGASTRODUODENOSCOPY (EGD);  Surgeon: Milus Banister, MD;  Location: [facility];  Service: Endoscopy;  Laterality: N/A;  ? ESOPHAGOGASTRODUODENOSCOPY  01/02/2012  ? Procedure: ESOPHAGOGASTRODUODENOSCOPY (EGD);  Surgeon: Milus Banister, MD;  Location: [facility];  Service: Endoscopy;  Laterality: N/A;  ? ESOPHAGOGASTRODUODENOSCOPY N/A 01/22/2013  ? Procedure: ESOPHAGOGASTRODUODENOSCOPY (EGD);  Surgeon: Jerene Bears, MD;  Location: [facility];  Service: Gastroenterology;  Laterality: N/A;  ? ESOPHAGOGASTRODUODENOSCOPY N/A 10/22/2013  ? Procedure: ESOPHAGOGASTRODUODENOSCOPY (EGD);  Surgeon: Ladene Artist, MD;  Location: [facility] ENDOSCOPY;  Service: Endoscopy;  Laterality: N/A;  ? HERNIA REPAIR    ? LAPAROSCOPIC GASTRIC BAND REMOVAL WITH LAPAROSCOPIC GASTRIC SLEEVE RESECTION  2016  ? LAPAROSCOPIC GASTRIC BANDING    ? TUBAL LIGATION    ? UPPER GASTROINTESTINAL ENDOSCOPY    ? ? ?Allergies:  ?Allergies  ?Allergen Reactions  ? Sulfa Antibiotics Other (See Comments)  ?  "paralyzes me" ?Made legs paralyzed  ? Iodine Rash  ?  Topical only - not IV dye ?Topical only - not IV dye ?  ? Cephalexin Itching and Rash  ? Ciprofloxacin Hcl Itching and Rash  ? Penicillins Itching and Rash  ? ? ?Family History:  ?Family History  ?Problem Relation Age of Onset  ? Breast cancer Mother   ? Diabetes Mother   ? COPD Mother   ? Heart disease Father   ?     and Sister  ?  Cirrhosis Father   ?     Non-alcholic   ? Diabetes Father   ? Hypertension Father   ? Kidney disease Father   ?     chronic kidney stones  ? Diabetes Sister   ? Diabetes Brother   ? Colon cancer Neg Hx   ? ? ?Social History:  ?Social History  ? ?Tobacco Use  ? Smoking status: Former  ?  Types: Cigarettes  ?  Quit date: 11/16/2005  ?  Years since quitting: 15.7  ? Smokeless tobacco: Never  ?Vaping Use  ? Vaping Use: Never used  ?Substance Use Topics  ? Alcohol use: Yes  ?  Comment: occasion - once or twice a year  ? Drug use: No   ? ? ?ROS: ?Constitutional:  Negative for fever, chills, weight loss ?CV: Negative for chest pain, previous MI, hypertension ?Respiratory:  Negative for shortness of breath, wheezing, sleep apnea, frequent cough ?GI:  Negative for nausea, vomiting, bloody stool, GERD ? ?Physical exam: ?BP (!) 101/57   Pulse 69   LMP 07/30/2010  ?GENERAL APPEARANCE:  Well appearing, well developed, well nourished, NAD ?HEENT:  Atraumatic, normocephalic, oropharynx clear ?NECK:  Supple without lymphadenopathy or thyromegaly ?ABDOMEN:  Soft, non-tender, no masses ?EXTREMITIES:  Moves all extremities well, without clubbing, cyanosis, or edema ?NEUROLOGIC:  Alert and oriented x 3, normal gait, CN II-XII grossly intact ?MENTAL STATUS:  appropriate ?BACK:  Non-tender to palpation, No CVAT ?SKIN:  Warm, dry, and intact ? ? ?Results: ?U/A: dipstick negative ?

## 2021-08-23 LAB — URINALYSIS, ROUTINE W REFLEX MICROSCOPIC
Bilirubin, UA: NEGATIVE
Glucose, UA: NEGATIVE
Ketones, UA: NEGATIVE
Leukocytes,UA: NEGATIVE
Nitrite, UA: NEGATIVE
Protein,UA: NEGATIVE
RBC, UA: NEGATIVE
Specific Gravity, UA: 1.025 (ref 1.005–1.030)
Urobilinogen, Ur: 1 mg/dL (ref 0.2–1.0)
pH, UA: 6 (ref 5.0–7.5)

## 2021-09-01 ENCOUNTER — Ambulatory Visit: Payer: Medicaid Other | Admitting: Internal Medicine

## 2021-11-04 ENCOUNTER — Telehealth: Payer: Medicare Other | Admitting: Urgent Care

## 2021-11-04 ENCOUNTER — Encounter: Payer: Self-pay | Admitting: Urgent Care

## 2021-11-04 DIAGNOSIS — U071 COVID-19: Secondary | ICD-10-CM | POA: Diagnosis not present

## 2021-11-04 DIAGNOSIS — J449 Chronic obstructive pulmonary disease, unspecified: Secondary | ICD-10-CM | POA: Diagnosis not present

## 2021-11-04 MED ORDER — NIRMATRELVIR/RITONAVIR (PAXLOVID) TABLET (RENAL DOSING): 2.0000 | ORAL_TABLET | Freq: Two times a day (BID) | ORAL | 0 refills | Status: AC

## 2021-11-04 NOTE — Patient Instructions (Signed)
Kathaleen Maser, thank you for joining [name], PA for today's virtual visit.  While this provider is not your primary care provider (PCP), if your PCP is located in our provider database this encounter information will be shared with them immediately following your visit.  Consent: (Patient) Terri [name] provided verbal consent for this virtual visit at the beginning of the encounter.  Current Medications:  Current Outpatient Medications:    albuterol (PROVENTIL HFA;VENTOLIN HFA) 108 (90 BASE) MCG/ACT inhaler, Inhale 2 puffs into the lungs every 6 (six) hours as needed for wheezing or shortness of breath., Disp: , Rfl:    AMBULATORY NON FORMULARY MEDICATION, Medication Name: tool softner + laxative 1 po daily, Disp: , Rfl:    calcium carbonate (TUMS - DOSED IN MG ELEMENTAL CALCIUM) 500 MG chewable tablet, Chew 2 tablets by mouth as needed., Disp: , Rfl:    diazepam (VALIUM) 5 MG tablet, Take 1 tablet by mouth as needed., Disp: , Rfl:    escitalopram (LEXAPRO) 20 MG tablet, Take 20 mg by mouth daily., Disp: , Rfl:    ipratropium-albuterol (DUONEB) 0.5-2.5 (3) MG/3ML SOLN, Inhale 3 mLs into the lungs every 6 (six) hours as needed. , Disp: , Rfl:    levocetirizine (XYZAL) 5 MG tablet, Take 1 tablet (5 mg total) by mouth every evening., Disp: 30 tablet, Rfl: 0   meclizine (ANTIVERT) 25 MG tablet, Take 1 tablet (25 mg total) by mouth 3 (three) times daily as needed for dizziness., Disp: 15 tablet, Rfl: 0   rosuvastatin (CRESTOR) 40 MG tablet, Take 40 mg by mouth daily., Disp: , Rfl:    tiZANidine (ZANAFLEX) 2 MG tablet, Take 2 mg by mouth every 6 (six) hours as needed for muscle spasms., Disp: , Rfl:    tiZANidine (ZANAFLEX) 4 MG tablet, Take 4 mg by mouth 3 (three) times daily as needed., Disp: , Rfl:    Vitamin D, Ergocalciferol, (DRISDOL) 1.25 MG (50000 UT) CAPS capsule, Take 1 capsule by mouth once a week., Disp: , Rfl:    Medications ordered in this encounter:  No orders of the  defined types were placed in this encounter.    *If you need refills on other medications prior to your next appointment, please contact your pharmacy*  Follow-Up: Call back or seek an in-person evaluation if the symptoms worsen or if the condition fails to improve as anticipated.  Other Instructions You are positive for covid. As discussed, possible treatment options include paxlovid, a non-FDA approved  investigational drug approved for Emergency use authorization (EUA) only or molnupiravir. Both of these medications are reserved and recommended for individuals with significant comorbidities or risk factors. For those who are otherwise young and healthy, supportive measures with rest and hydration may be adequate. Please monitor regression of your symptoms. Some people have tried OTC Quercetin to help fight off illness. If any new or worsening symptoms develops, particularly uncontrollable fever, severe shortness of breath or chest pain, please head to the ER.   You indicated you are not taking any of your prescription medication at this time. Please do not restart them until after completion of the paxlovid to prevent any interactions or serious side effects.   If you have been instructed to have an in-person evaluation today at a local Urgent Care facility, please use the link below. It will take you to a list of all of our available [facility], including address, phone number and hours of operation. Please do not delay  care.  [facility]  If you or a family member do not have a primary care provider, use the link below to schedule a visit and establish care. When you choose a [facility] primary care physician or advanced practice provider, you gain a long-term partner in health. Find a Primary Care Provider  Learn more about [facility] in-office and virtual care options: [facility]

## 2021-11-04 NOTE — Progress Notes (Signed)
Virtual Visit Consent   Terri [name], you are scheduled for a virtual visit with a [facility] provider today. Just as with appointments in the office, your consent must be obtained to participate. Your consent will be active for this visit and any virtual visit you may have with one of our providers in the next 365 days. If you have a MyChart account, a copy of this consent can be sent to you electronically.  As this is a virtual visit, video technology does not allow for your provider to perform a traditional examination. This may limit your provider's ability to fully assess your condition. If your provider identifies any concerns that need to be evaluated in person or the need to arrange testing (such as labs, EKG, etc.), we will make arrangements to do so. Although advances in technology are sophisticated, we cannot ensure that it will always work on either your end or our end. If the connection with a video visit is poor, the visit may have to be switched to a telephone visit. With either a video or telephone visit, we are not always able to ensure that we have a secure connection.  By engaging in this virtual visit, you consent to the provision of healthcare and authorize for your insurance to be billed (if applicable) for the services provided during this visit. Depending on your insurance coverage, you may receive a charge related to this service.  I need to obtain your verbal consent now. Are you willing to proceed with your visit today? Terri [name] has provided verbal consent on 11/04/2021 for a virtual visit (video or telephone). [name], PA  Date: 11/04/2021 11:34 AM  Virtual Visit via Video Note   I, Terri [name], connected with  Terri [name]  ([id number], 03-18-1966) on 11/04/21 at 11:15 AM EDT by a video-enabled telemedicine application and verified that I am speaking with the correct person using two identifiers.  Location: Patient: Virtual Visit Location Patient:  Home Provider: Virtual Visit Location Provider: Home Office   I discussed the limitations of evaluation and management by telemedicine and the availability of in person appointments. The patient expressed understanding and agreed to proceed.    History of Present Illness: Terri [name] is a 56 y.o. who identifies as a female who was assigned female at birth, and is being seen today for positive home covid test.  HPI: Pleasant 56yo female presents today requesting paxlovid due to a positive home covid test. Was on a cruise, started feeling achy and tired on Wednesday, their last day on the ship. The cruise docked on Thursday and her symptoms progressed. She now is experiencing fever, burning in her throat, and nasal congestion. Reports a mild dry cough "more like a fake cough", and throat clearing. Has not taken her temp at home, fever is subjective, but has been taking OTC tylenol. She has had some "yellow watery like mucous with a terrible taste." Does have COPD, but is not currently taking anything for it. She denies any chest congestion, SOB, DOE, wheezing, palpitations, CP. Took a covid test yesterday which was positive. Has also been taking nyquil. Medication list reviewed, pt states she is not actively taking any medications. She is supposed to be taking crestor, but reports not having taken it since before the cruise. Pt has not been vaccinated against covid. She has had covid in the past "when it first came out" and denied any complications or long term problems from it.  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   Chest pain 08/19/2012   COPD exacerbation ([place])  08/19/2012   Diabetes ([place]) 08/19/2012   Lapband in [facility] 2009 02/13/2012   Nonspecific (abnormal) findings on radiological and other examination of gastrointestinal tract 01/02/2012   Foreign body in stomach 01/02/2012   Dysphagia 11/17/2011    Allergies:  Allergies  Allergen Reactions   Sulfa Antibiotics Other (See Comments)    "paralyzes me" Made legs paralyzed   Iodine Rash    Topical only - not IV dye Topical only - not IV dye    Cephalexin Itching and Rash   Ciprofloxacin Hcl Itching and Rash   Penicillins Itching and Rash   Medications:  Current Outpatient Medications:    albuterol (PROVENTIL HFA;VENTOLIN HFA) 108 (90 BASE) MCG/ACT inhaler, Inhale 2 puffs into the lungs every 6 (six) hours as needed for wheezing or shortness of breath., Disp: , Rfl:    AMBULATORY NON FORMULARY MEDICATION, Medication Name: tool softner + laxative 1 po daily, Disp: , Rfl:    calcium carbonate (TUMS - DOSED IN MG ELEMENTAL CALCIUM) 500 MG chewable tablet, Chew 2 tablets by mouth as needed., Disp: , Rfl:    diazepam (VALIUM) 5 MG tablet, Take 1 tablet by mouth as needed., Disp: , Rfl:    escitalopram (LEXAPRO) 20 MG tablet, Take 20 mg by mouth daily., Disp: , Rfl:    ipratropium-albuterol (DUONEB) 0.5-2.5 (3) MG/3ML SOLN, Inhale 3 mLs into the lungs every 6 (six) hours as needed. , Disp: , Rfl:    levocetirizine (XYZAL) 5 MG tablet, Take 1 tablet (5 mg total) by mouth every evening., Disp: 30 tablet, Rfl: 0   meclizine (ANTIVERT) 25 MG tablet, Take 1 tablet (25 mg total) by mouth 3 (three) times daily as needed for dizziness., Disp: 15 tablet, Rfl: 0   rosuvastatin (CRESTOR) 40 MG tablet, Take 40 mg by mouth daily., Disp: , Rfl:    tiZANidine (ZANAFLEX) 2 MG tablet, Take 2 mg by mouth every 6 (six) hours as needed for muscle spasms., Disp: , Rfl:    tiZANidine (ZANAFLEX) 4 MG tablet, Take 4 mg by mouth 3 (three) times daily as needed., Disp: , Rfl:    Vitamin D, Ergocalciferol, (DRISDOL) 1.25  MG (50000 UT) CAPS capsule, Take 1 capsule by mouth once a week., Disp: , Rfl:   Observations/Objective: Patient is well-developed, well-nourished in no acute distress.  Resting in bed at home. Non-toxic, NAD Head is normocephalic, atraumatic.  No labored breathing. Is not tachypneic, no nasal flaring or accessory muscle use. No coughing appreciated during encounter. Speech is clear and coherent with logical content.  Patient is alert and oriented at baseline.  Erythema to nose with minimal clear rhinorrhea.   Assessment and Plan: 1. COVID-19  2. Chronic obstructive pulmonary disease, unspecified COPD type [facility])  56yo female with a positive covid test presenting with symptoms for the past 3-4 days. Does have risk factors for more severe disease. Discussed treatment options, indications for treatment, and side effects. Pt without severe infection at present time. Pt opted to start paxlovid, pt fits within the treatment window. No hx of liver or renal disease. Last documented  GFR >60, creatinine 0.88.   Follow Up Instructions: I discussed the assessment and treatment plan with the patient. The patient was provided an opportunity to ask questions and all were answered. The patient agreed with the plan and demonstrated an understanding of the instructions.  A copy of instructions were sent to the patient via MyChart unless otherwise noted below.    The patient was advised to call back or seek an in-person evaluation if the symptoms worsen or if the condition fails to improve as anticipated.  Time:  I spent 12 minutes with the patient via telehealth technology discussing the above problems/concerns.    [place], PA

## 2021-11-24 ENCOUNTER — Ambulatory Visit: Payer: Medicaid Other | Admitting: Urology

## 2021-12-22 ENCOUNTER — Ambulatory Visit: Payer: Medicare Other | Admitting: Podiatry

## 2022-04-25 ENCOUNTER — Ambulatory Visit (HOSPITAL_COMMUNITY)
Admission: RE | Admit: 2022-04-25 | Discharge: 2022-04-25 | Disposition: A | Discharge status: Home / Self Care | Payer: Medicare Other | Source: Ambulatory Visit | Attending: Adult Health | Admitting: Adult Health

## 2022-04-25 ENCOUNTER — Other Ambulatory Visit (HOSPITAL_COMMUNITY): Payer: Self-pay | Admitting: Adult Health

## 2022-04-25 DIAGNOSIS — M79671 Pain in right foot: Secondary | ICD-10-CM

## 2022-04-27 IMAGING — US US ABDOMEN LIMITED
1 series · 14 of 25 positions shown · non-contrast
Comparison: CT today.

CLINICAL DATA: Postprandial right upper quadrant pain.

EXAM:
ULTRASOUND ABDOMEN LIMITED RIGHT UPPER QUADRANT

[Series 1: us abdomen limited · 14 of 42 slices shown]
[im 1/42]
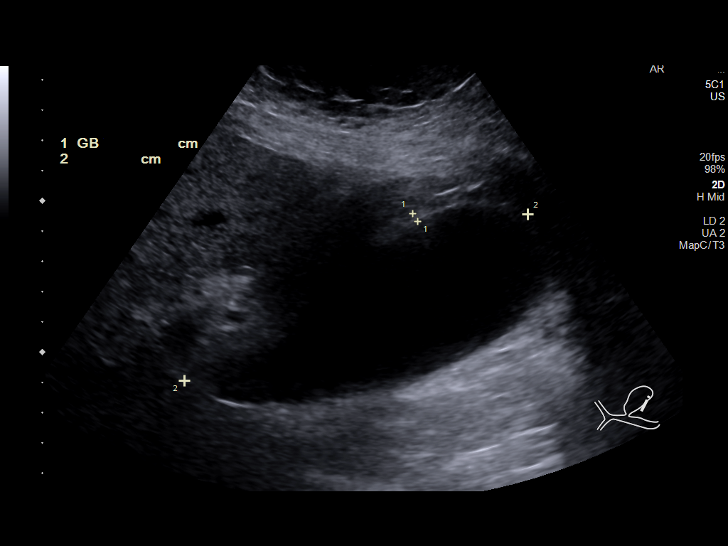
[im 4/42]
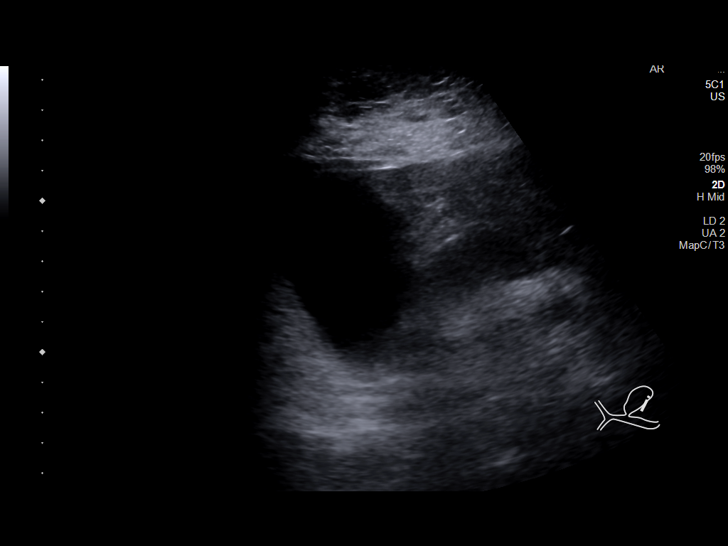
[im 7/42]
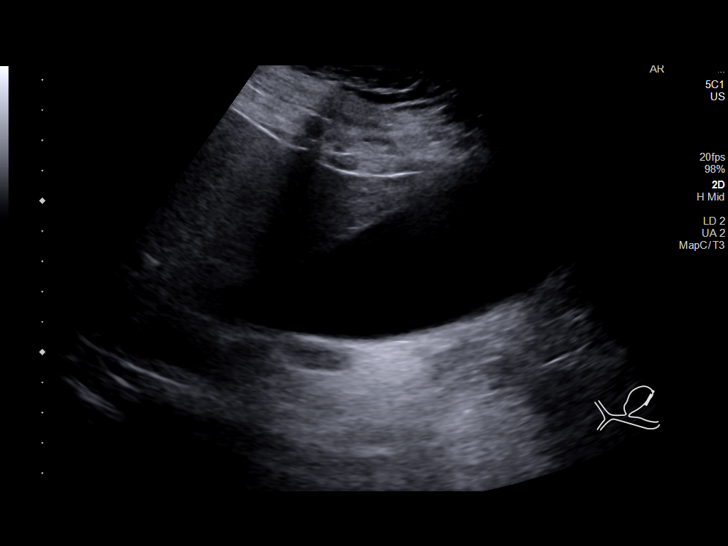
[im 11/42]
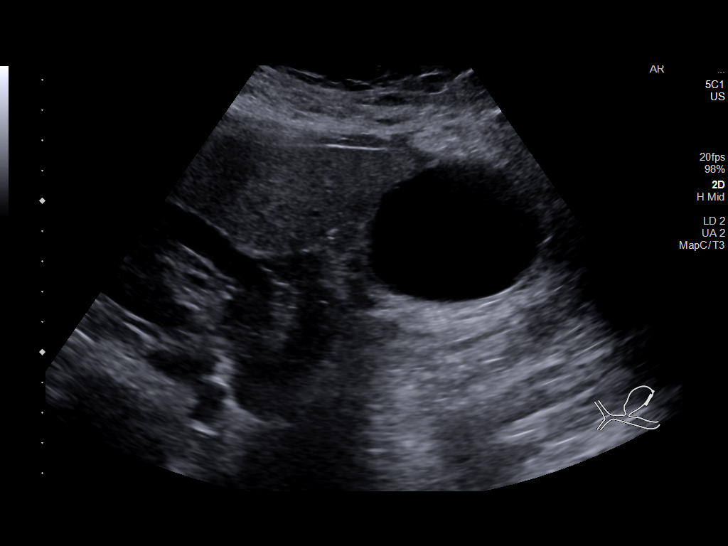
[im 14/42]
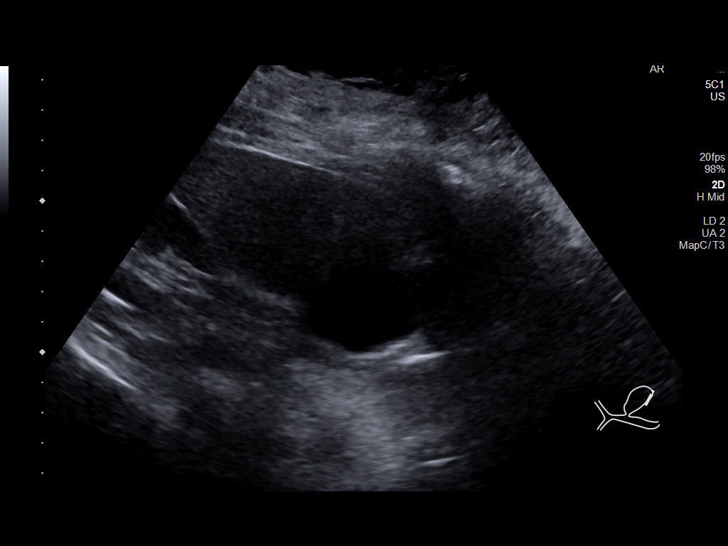
[im 16/42]
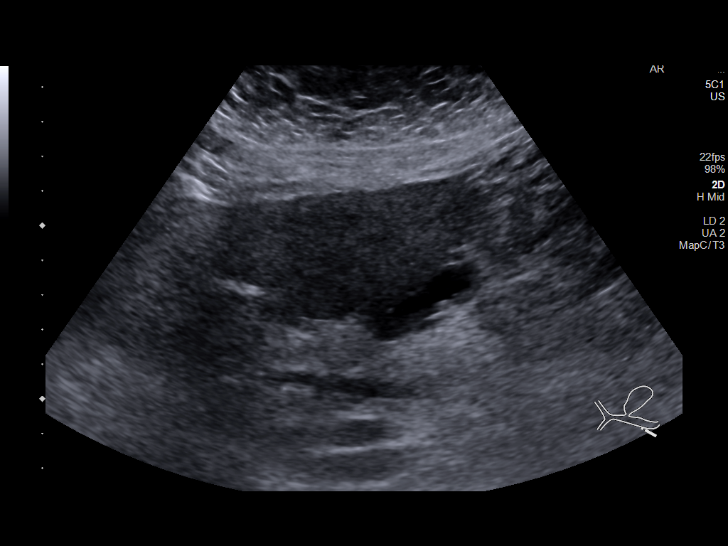
[im 19/42]
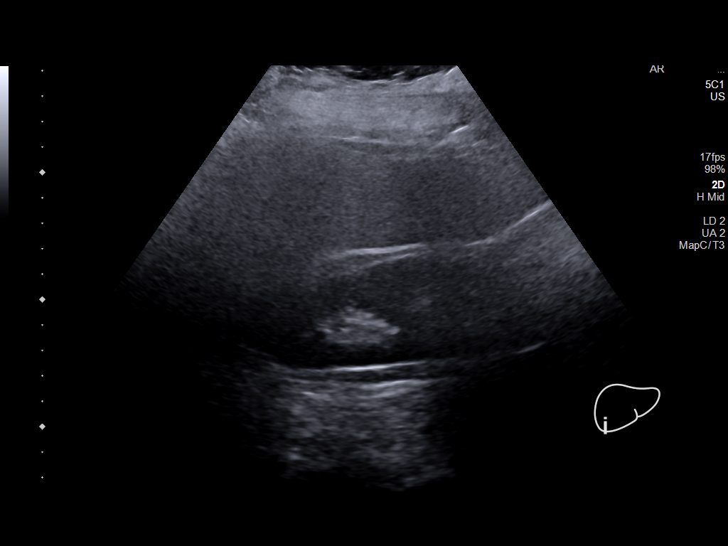
[im 23/42]
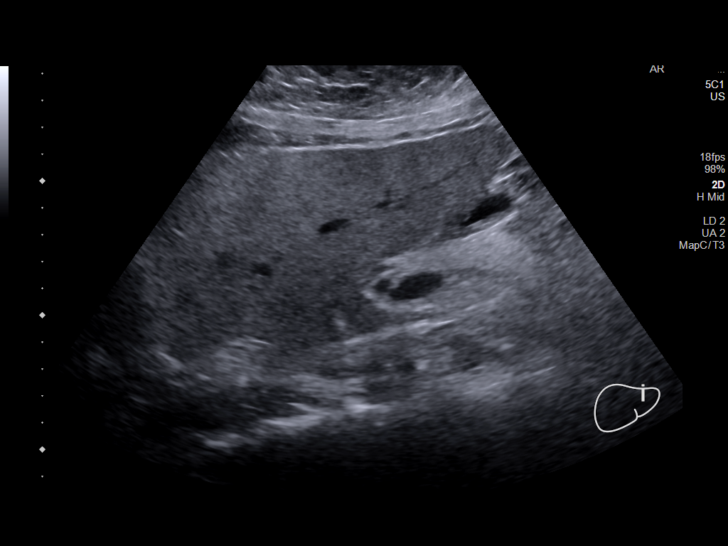
[im 26/42]
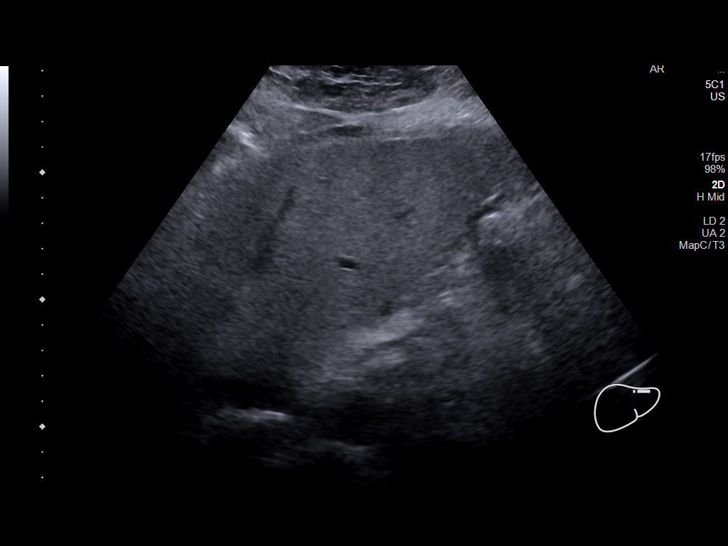
[im 28/42]
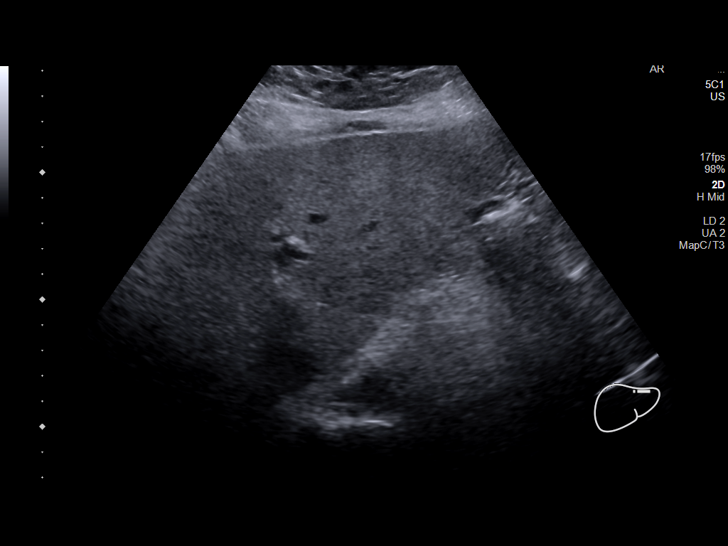
[im 31/42]
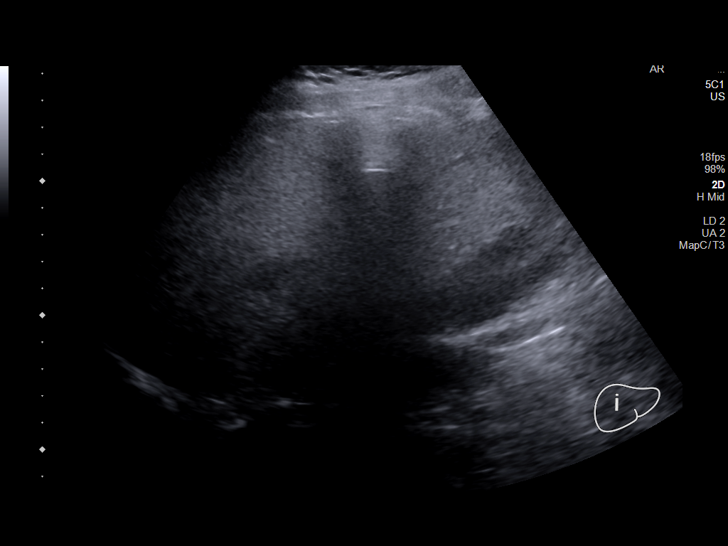
[im 35/42]
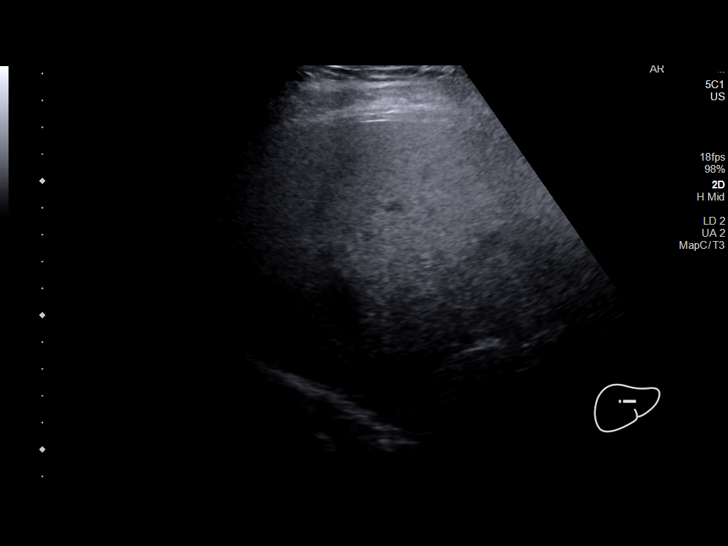
[im 38/42]
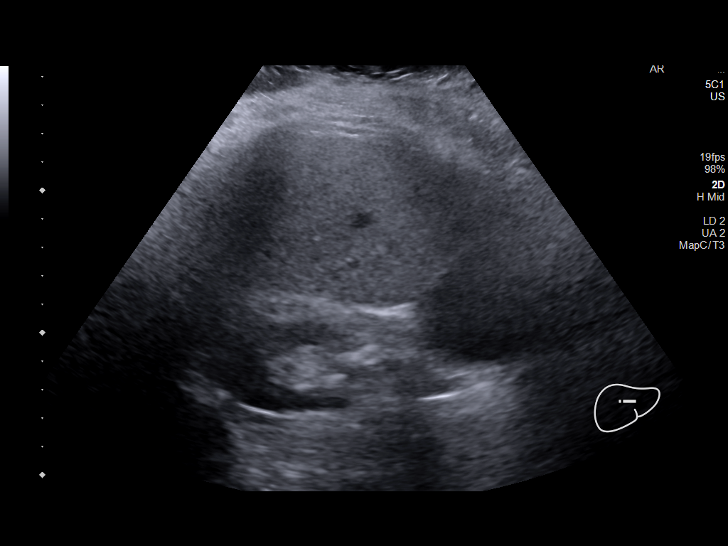
[im 42/42]
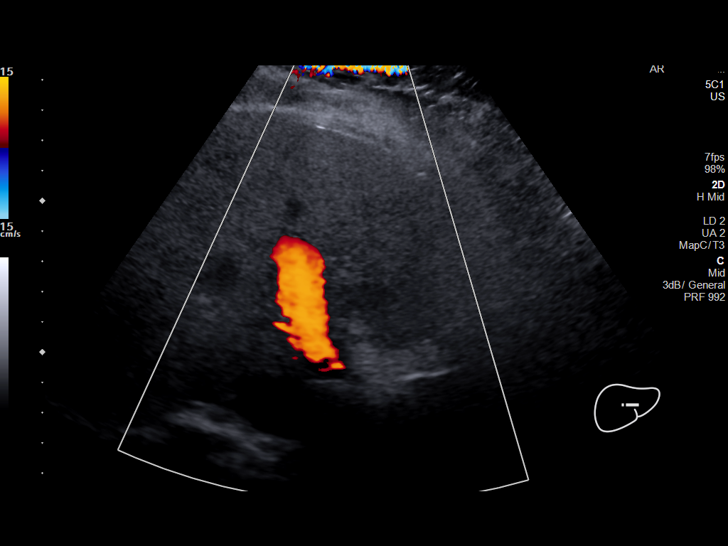

[14 of 25 positions shown; findings below may reference images not displayed]

FINDINGS: Gallbladder:

Gallbladder is mildly distended measuring 12.6 cm in diameter. No
evidence of cholelithiasis/sludge. No significant gallbladder wall
thickening. Negative sonographic Murphy sign. No adjacent free
fluid.

Common bile duct:

Diameter: 5.4 mm.

Liver:

Mild increased cortical echogenicity compatible with steatosis
without focal mass. Portal vein is patent on color Doppler imaging
with normal direction of blood flow towards the liver.

Other: None.
IMPRESSION: No acute hepatobiliary findings.

Hepatic steatosis without focal mass.

## 2022-04-27 IMAGING — CT CT ABD-PELV W/ CM
2 of 5 series · 16 of 46 positions shown, 18 images · IV contrast (omnipaque)
Comparison: August 12, 2018

CLINICAL DATA: Epigastric and mid chest pain after eating with
vomiting.

EXAM:
CT ABDOMEN AND PELVIS WITH CONTRAST
TECHNIQUE: Multidetector CT imaging of the abdomen and pelvis was performed
using the standard protocol following bolus administration of
intravenous contrast.
CONTRAST:  100mL OMNIPAQUE IOHEXOL 300 MG/ML  SOLN

[Series 2: axial st · axial · 0.92mm/px · z∈[+776,+1206]mm · 13 of 98 slices shown, 15 images]
[im 6/98  soft-tissue]
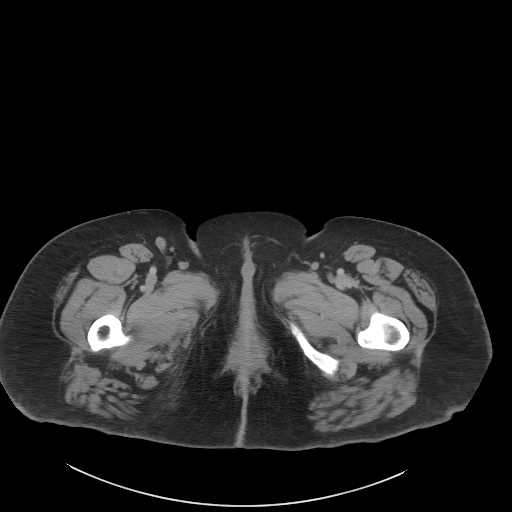
[im 6/98  bone]
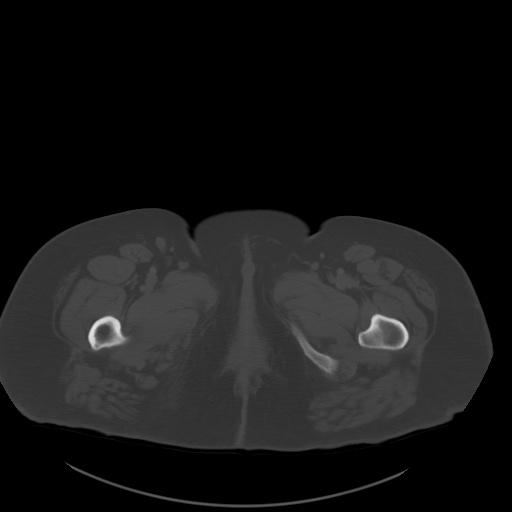
[im 12/98  soft-tissue]
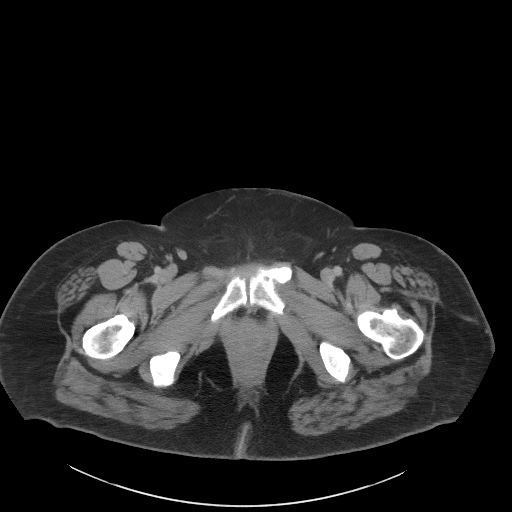
[im 23/98  soft-tissue]
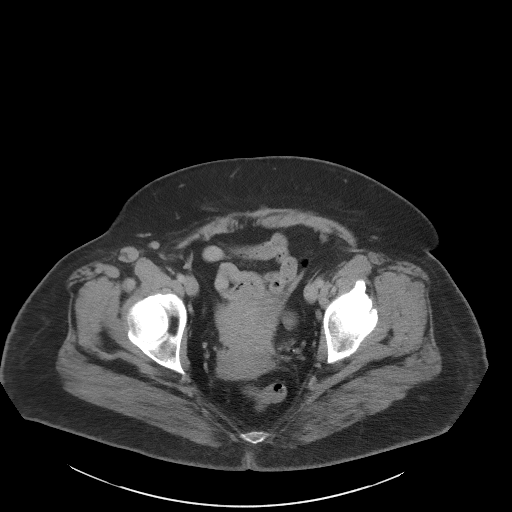
[im 29/98  soft-tissue]
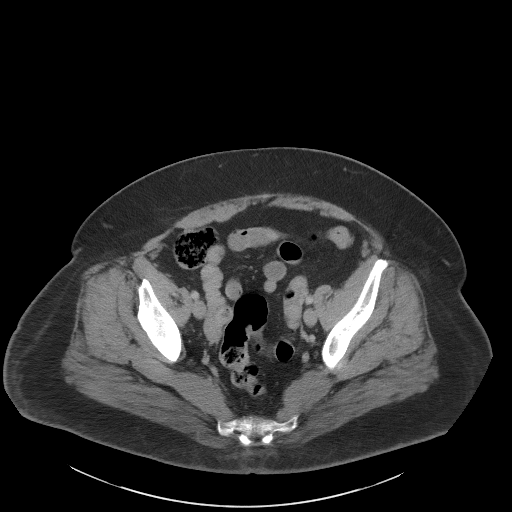
[im 35/98  soft-tissue]
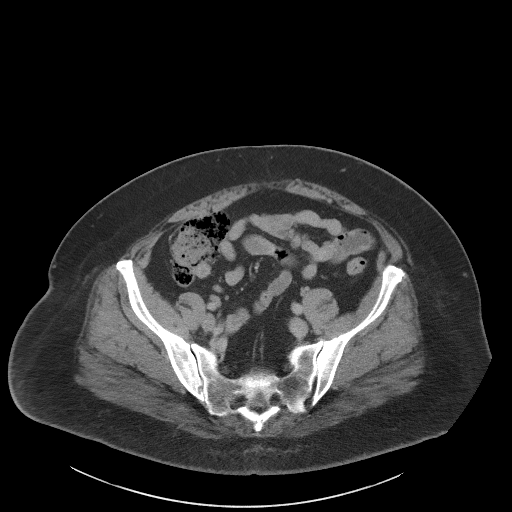
[im 40/98  soft-tissue]
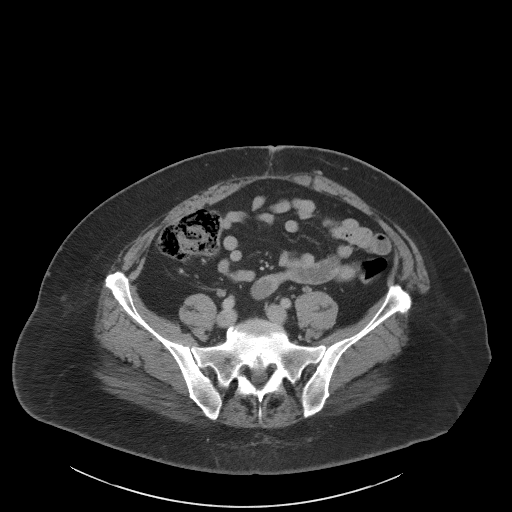
[im 52/98  soft-tissue]
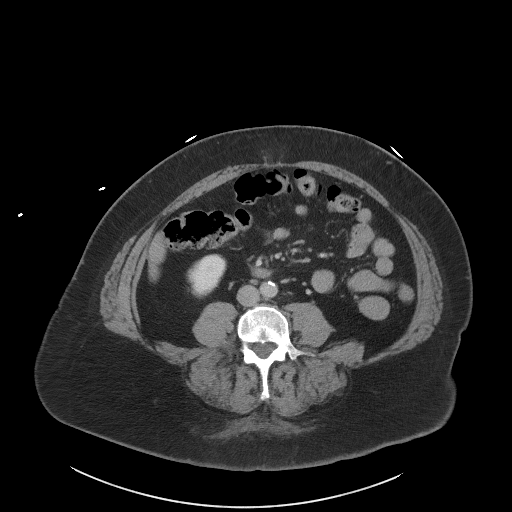
[im 58/98  soft-tissue]
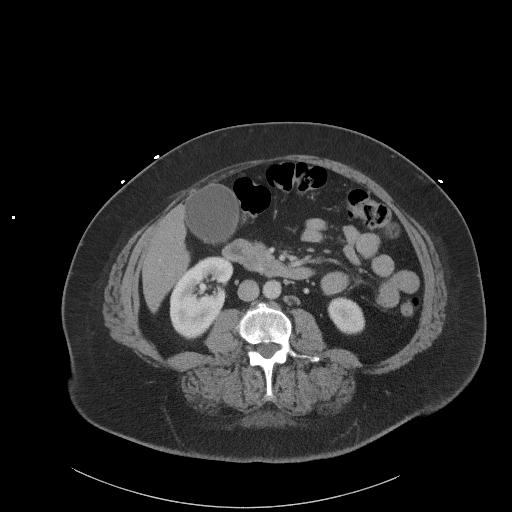
[im 63/98  soft-tissue]
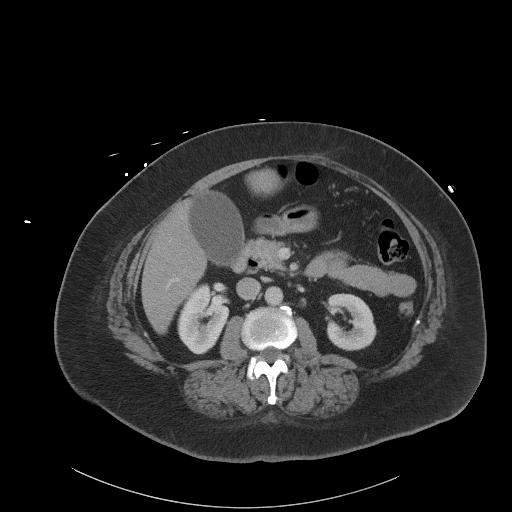
[im 63/98  bone]
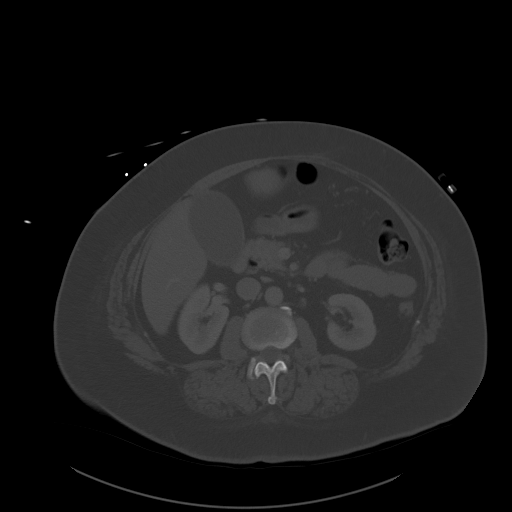
[im 69/98  soft-tissue]
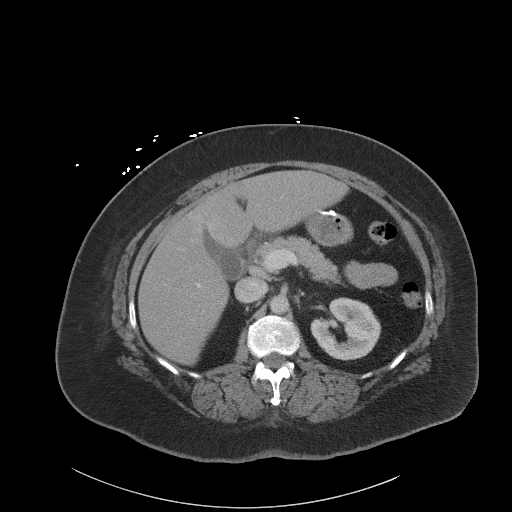
[im 75/98  soft-tissue]
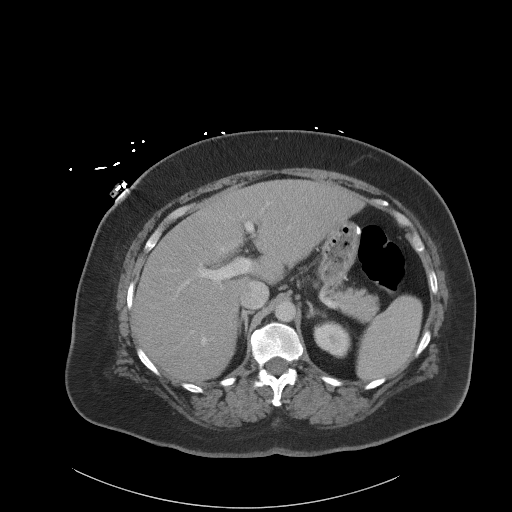
[im 86/98  soft-tissue]
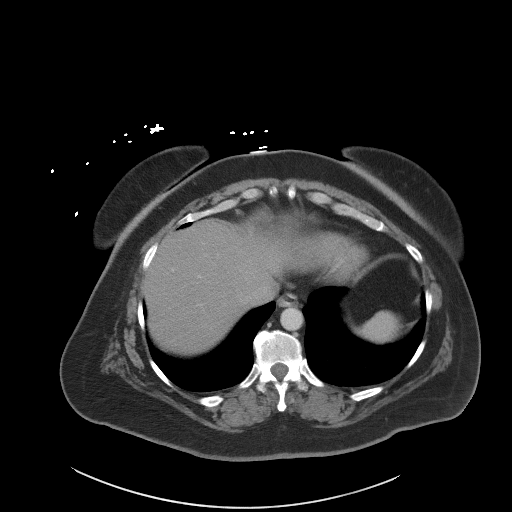
[im 92/98  soft-tissue]
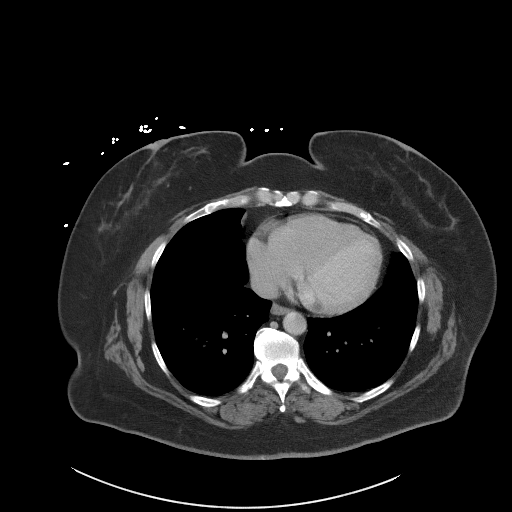

[Series 4: coronal st · coronal · 0.80mm/px · 3 of 120 slices shown]
[im 40/120  soft-tissue]
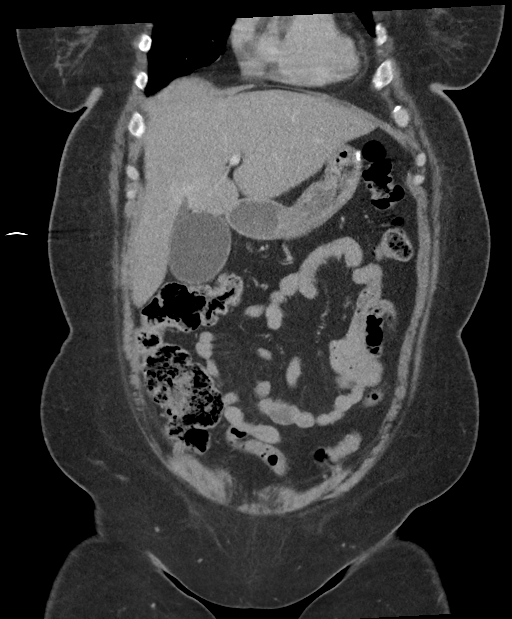
[im 53/120  soft-tissue]
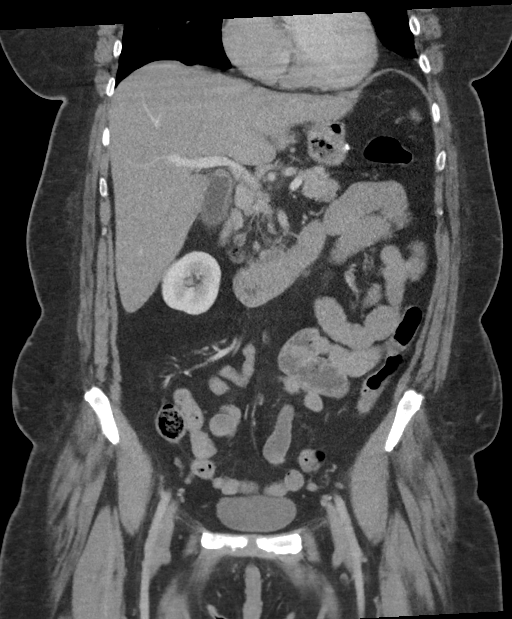
[im 67/120  soft-tissue]
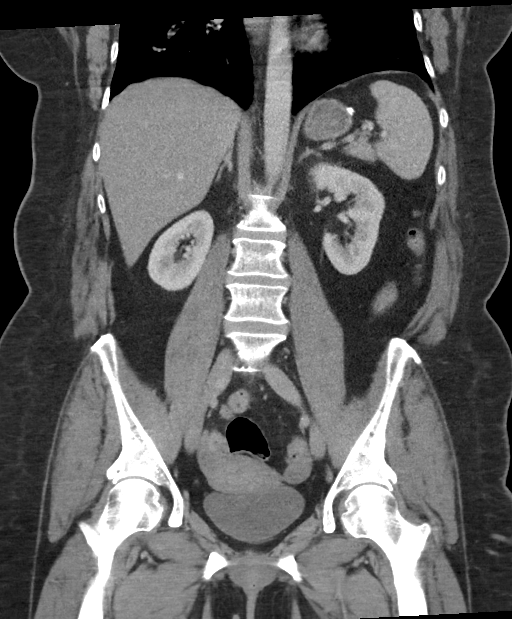

[16 of 46 positions shown; findings below may reference images not displayed]

FINDINGS: Lower chest: No acute abnormality.

Hepatobiliary: No focal liver abnormality is seen. No gallstones,
gallbladder wall thickening, or biliary dilatation.

Pancreas: Unremarkable. No pancreatic ductal dilatation or
surrounding inflammatory changes.

Spleen: Normal in size without focal abnormality.

Adrenals/Urinary Tract: Adrenal glands are unremarkable. Kidneys are
normal, without renal calculi, focal lesion, or hydronephrosis.
Bladder is unremarkable.

Stomach/Bowel: Ill-defined surgical sutures are seen along the
gastric region. Appendix appears normal. No evidence of bowel wall
thickening, distention, or inflammatory changes.

Vascular/Lymphatic: Mild aortic calcification. No enlarged abdominal
or pelvic lymph nodes.

Reproductive: Uterus and bilateral adnexa are unremarkable.

Other: A small, stable focus of inflammatory fat stranding is seen
along the midline of the anterior abdomen, at the level of the
umbilicus. No abdominopelvic ascites.

Musculoskeletal: No acute or significant osseous findings.
IMPRESSION: 1. Findings consistent with history of prior gastric surgery.
2. Small, stable area of fat necrosis within the anterior omentum.

Aortic Atherosclerosis ([id number]-U0C.C).

## 2022-06-16 ENCOUNTER — Ambulatory Visit (HOSPITAL_COMMUNITY): Payer: Medicare Other

## 2022-06-17 ENCOUNTER — Ambulatory Visit: Payer: 59

## 2022-08-02 ENCOUNTER — Encounter: Payer: Self-pay | Admitting: Radiology

## 2022-10-08 ENCOUNTER — Ambulatory Visit
Admission: RE | Admit: 2022-10-08 | Discharge: 2022-10-08 | Disposition: A | Discharge status: Home / Self Care | Payer: 59 | Source: Ambulatory Visit | Attending: Nurse Practitioner | Admitting: Nurse Practitioner

## 2022-10-08 VITALS — BP 112/64 | HR 66 | Temp 97.5°F | Resp 15

## 2022-10-08 DIAGNOSIS — R399 Unspecified symptoms and signs involving the genitourinary system: Secondary | ICD-10-CM | POA: Diagnosis present

## 2022-10-08 DIAGNOSIS — Z87440 Personal history of urinary (tract) infections: Secondary | ICD-10-CM

## 2022-10-08 LAB — POCT URINALYSIS DIP (MANUAL ENTRY)
Bilirubin, UA: NEGATIVE
Glucose, UA: NEGATIVE mg/dL
Ketones, POC UA: NEGATIVE mg/dL
Nitrite, UA: NEGATIVE
Protein Ur, POC: NEGATIVE mg/dL
Spec Grav, UA: 1.025 (ref 1.010–1.025)
Urobilinogen, UA: 2 E.U./dL — AB
pH, UA: 5.5 (ref 5.0–8.0)

## 2022-10-08 MED ORDER — NITROFURANTOIN MONOHYD MACRO 100 MG PO CAPS: 100.0000 mg | ORAL_CAPSULE | Freq: Two times a day (BID) | ORAL | 0 refills | Status: DC

## 2022-10-08 NOTE — ED Triage Notes (Signed)
Pt c/o urinary frequency x 5 days pt states she's having pressure frequency, urgency, burning.

## 2022-10-08 NOTE — ED Provider Notes (Signed)
[facility]    CSN: [id number] Arrival date & time: 10/08/22  0940      History   Chief Complaint Chief Complaint  Patient presents with   Urinary Frequency    Entered by patient    HPI Terri [name] is a 57 y.o. female.   The history is provided by the patient.   Patient presents for complaints of pain with urination, urinary frequency, urgency, and hematuria.  Patient states symptoms have been present for the past 5 days.  Patient states that she went on a cruise, and did not urinate often.  Patient denies fever, chills, chest pain, abdominal pain, nausea, vomiting, or diarrhea.  She reports that she does have a history of recurrent urinary tract infections.  She states her last UTI was sometime last year.  She went to [facility] neurology, she states she was given a prescription for antibiotic "in case" she needed one per her doctor.  Patient provided prescription which was dated March 2023 for Macrobid.  Patient states that while she was on her cruise, she did take Azo.  Patient is requesting this medication as she reports this best treats her urinary tract infections.  Denies history of kidney stones or kidney infection.  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   Chest pain 08/19/2012   COPD exacerbation (HCC) 08/19/2012   Diabetes (HCC) 08/19/2012   Lapband in [facility] 2009 02/13/2012   Nonspecific (abnormal) findings on  radiological and other examination of gastrointestinal tract 01/02/2012   Foreign body in stomach 01/02/2012   Dysphagia 11/17/2011    Past Surgical History:  Procedure Laterality Date   BLADDER SURGERY     ESOPHAGOGASTRODUODENOSCOPY  11/17/2011   Procedure: ESOPHAGOGASTRODUODENOSCOPY (EGD);  Surgeon: Rachael Fee, MD;  Location: [facility] ENDOSCOPY;  Service: Endoscopy;  Laterality: N/A;   ESOPHAGOGASTRODUODENOSCOPY  01/02/2012   Procedure: ESOPHAGOGASTRODUODENOSCOPY (EGD);  Surgeon: Rachael Fee, MD;  Location: [facility] ENDOSCOPY;  Service: Endoscopy;  Laterality: N/A;   ESOPHAGOGASTRODUODENOSCOPY N/A 01/22/2013   Procedure: ESOPHAGOGASTRODUODENOSCOPY (EGD);  Surgeon: Beverley Fiedler, MD;  Location: [facility] ENDOSCOPY;  Service: Gastroenterology;  Laterality: N/A;   ESOPHAGOGASTRODUODENOSCOPY N/A 10/22/2013   Procedure: ESOPHAGOGASTRODUODENOSCOPY (EGD);  Surgeon: Meryl Dare, MD;  Location: [facility] ENDOSCOPY;  Service: Endoscopy;  Laterality: N/A;   HERNIA REPAIR     LAPAROSCOPIC GASTRIC BAND REMOVAL WITH LAPAROSCOPIC GASTRIC SLEEVE RESECTION  2016   LAPAROSCOPIC GASTRIC BANDING     TUBAL LIGATION     UPPER GASTROINTESTINAL ENDOSCOPY      OB History   No obstetric history on file.      Home Medications    Prior to Admission medications   Medication Sig Start Date End Date Taking? Authorizing Provider  nitrofurantoin, macrocrystal-monohydrate, (MACROBID) 100 MG capsule Take 1 capsule (100 mg total) by mouth 2 (two) times daily. 10/08/22  Yes Marayah Higdon-Warren, Sadie Haber, NP  albuterol (PROVENTIL HFA;VENTOLIN HFA) 108 (  90 BASE) MCG/ACT inhaler Inhale 2 puffs into the lungs every 6 (six) hours as needed for wheezing or shortness of breath.    [provider]  AMBULATORY NON FORMULARY MEDICATION Medication Name: tool softner + laxative 1 po daily    [provider]  calcium carbonate (TUMS - DOSED IN MG ELEMENTAL CALCIUM) 500 MG chewable tablet Chew 2 tablets by mouth as needed.    [provider]  diazepam (VALIUM) 5 MG tablet Take 1 tablet by mouth as needed. 08/16/14   [provider]  escitalopram (LEXAPRO) 20 MG tablet Take 20 mg by mouth daily.    [provider]  ipratropium-albuterol (DUONEB) 0.5-2.5 (3) MG/3ML SOLN Inhale 3 mLs into the lungs every 6 (six) hours as needed.  09/10/12   [provider]  levocetirizine (XYZAL) 5 MG tablet Take 1 tablet (5 mg total) by mouth every evening. 08/07/20   Bing Neighbors, NP  meclizine (ANTIVERT) 25 MG tablet Take 1 tablet (25 mg total) by mouth 3 (three) times daily as needed for dizziness. 02/03/21   Geoffery Lyons, MD  rosuvastatin (CRESTOR) 40 MG tablet Take 40 mg by mouth daily. 08/19/21   [provider]  tiZANidine (ZANAFLEX) 2 MG tablet Take 2 mg by mouth every 6 (six) hours as needed for muscle spasms.    [provider]  tiZANidine (ZANAFLEX) 4 MG tablet Take 4 mg by mouth 3 (three) times daily as needed. 06/16/21   [provider]  Vitamin D, Ergocalciferol, (DRISDOL) 1.25 MG (50000 UT) CAPS capsule Take 1 capsule by mouth once a week. 12/31/18   [provider]    Family History Family History  Problem Relation Age of Onset   Breast cancer Mother    Diabetes Mother    COPD Mother    Heart disease Father        and Sister   Cirrhosis Father        Non-alcholic    Diabetes Father    Hypertension Father    Kidney disease Father        chronic kidney stones   Diabetes Sister    Diabetes Brother    Colon cancer Neg Hx     Social History Social History   Tobacco Use   Smoking status: Former    Types: Cigarettes    Quit date: 11/16/2005    Years since quitting: 16.9   Smokeless tobacco: Never  Vaping Use   Vaping Use: Never used  Substance Use Topics   Alcohol use: Yes    Comment: occasion - once or twice a year   Drug use: No     Allergies   Sulfa antibiotics, Iodine, Cephalexin, Ciprofloxacin hcl, and Penicillins   Review of  Systems Review of Systems Per HPI  Physical Exam Triage Vital Signs ED Triage Vitals  Enc Vitals Group     BP 10/08/22 1015 112/64     Pulse Rate 10/08/22 1015 66     Resp 10/08/22 1015 15     Temp 10/08/22 1015 (!) 97.5 F (36.4 C)     Temp Source 10/08/22 1015 Oral     SpO2 10/08/22 1015 98 %     Weight --      Height --      Head Circumference --      Peak Flow --      Pain Score 10/08/22 1020 3     Pain Loc --      Pain Edu? --  Excl. in GC? --    No data found.  Updated Vital Signs BP 112/64 (BP Location: Right Arm)   Pulse 66   Temp (!) 97.5 F (36.4 C) (Oral)   Resp 15   LMP 07/30/2010   SpO2 98%   Visual Acuity Right Eye Distance:   Left Eye Distance:   Bilateral Distance:    Right Eye Near:   Left Eye Near:    Bilateral Near:     Physical Exam Vitals and nursing note reviewed.  Constitutional:      General: She is not in acute distress.    Appearance: Normal appearance.  HENT:     Head: Normocephalic.  Eyes:     Extraocular Movements: Extraocular movements intact.     Conjunctiva/sclera: Conjunctivae normal.     Pupils: Pupils are equal, round, and reactive to light.  Cardiovascular:     Rate and Rhythm: Regular rhythm.     Pulses: Normal pulses.     Heart sounds: Normal heart sounds.  Pulmonary:     Effort: Pulmonary effort is normal. No respiratory distress.     Breath sounds: Normal breath sounds. No stridor. No wheezing, rhonchi or rales.  Abdominal:     General: Bowel sounds are normal.     Palpations: Abdomen is soft.     Tenderness: There is no abdominal tenderness. There is no right CVA tenderness or left CVA tenderness.  Musculoskeletal:     Cervical back: Normal range of motion.  Skin:    General: Skin is warm and dry.  Neurological:     General: No focal deficit present.     Mental Status: She is alert and oriented to person, place, and time.  Psychiatric:        Mood and Affect: Mood normal.        Behavior: Behavior  normal.      UC Treatments / Results  Labs (all labs ordered are listed, but only abnormal results are displayed) Labs Reviewed  POCT URINALYSIS DIP (MANUAL ENTRY) - Abnormal; Notable for the following components:      Result Value   Blood, UA trace-intact (*)    Urobilinogen, UA 2.0 (*)    Leukocytes, UA Small (1+) (*)    All other components within normal limits  URINE CULTURE    EKG   Radiology No results found.  Procedures Procedures (including critical care time)  Medications Ordered in UC Medications - No data to display  Initial Impression / Assessment and Plan / UC Course  I have reviewed the triage vital signs and the nursing notes.  Pertinent labs & imaging results that were available during my care of the patient were reviewed by me and considered in my medical decision making (see chart for details).  Patient is well-appearing, she is in no acute distress, vital signs are stable.  Urinalysis shows small leukocytes, and.  Based on the patient's symptoms, and urinalysis, will treat empirically with Macrobid 100 mg.  Urine culture is pending.  Supportive care recommendations were provided and discussed with the patient to include increasing her water intake, voiding every 2 hours, and over-the-counter analgesics for pain or discomfort.  Patient was advised that if the urine culture is negative, and she continues to experience symptoms, she will need to follow-up with urology.  Patient was also given strict ER follow-up precautions.  Patient is in agreement with this plan of care and verbalizes understanding.  All questions were answered.  Patient stable for discharge.  Final Clinical Impressions(s) / UC Diagnoses   Final diagnoses:  UTI symptoms  History of recurrent UTI (urinary tract infection)     Discharge Instructions      -Your urinalysis shows leukocytes and blood which can indicate a urinary tract infection.  A urine culture has been ordered.  If  the results of the urine culture are negative, you will also need to follow up with your urologist if symptoms continue to persist. -Take medications as prescribed. -Increase fluids.  Try to drink at least 8-10 8 ounce glasses of water daily. -May take Tylenol for pain, fever, or general discomfort. -Develop a toileting schedule that will allow you to toilet at least every 2 hours. -Avoid caffeine to include tea, soda, and coffee. -If you are sexually active, void at least 15 to 20 minutes after sexual intercourse. -Follow-up in the emergency department if you develop fever, chills, worsening abdominal pain, worsening urinary symptoms or other concerns.      ED Prescriptions     Medication Sig Dispense Auth. Provider   nitrofurantoin, macrocrystal-monohydrate, (MACROBID) 100 MG capsule Take 1 capsule (100 mg total) by mouth 2 (two) times daily. 10 capsule Raidyn Breiner-Warren, Sadie Haber, NP      PDMP not reviewed this encounter.   Abran Cantor, NP 10/08/22 1047

## 2022-10-08 NOTE — Discharge Instructions (Addendum)
-  Your urinalysis shows leukocytes and blood which can indicate a urinary tract infection.  A urine culture has been ordered.  If the results of the urine culture are negative, you will also need to follow up with your urologist if symptoms continue to persist. -Take medications as prescribed. -Increase fluids.  Try to drink at least 8-10 8 ounce glasses of water daily. -May take Tylenol for pain, fever, or general discomfort. -Develop a toileting schedule that will allow you to toilet at least every 2 hours. -Avoid caffeine to include tea, soda, and coffee. -If you are sexually active, void at least 15 to 20 minutes after sexual intercourse. -Follow-up in the emergency department if you develop fever, chills, worsening abdominal pain, worsening urinary symptoms or other concerns.

## 2022-10-09 LAB — URINE CULTURE: Culture: NO GROWTH

## 2022-12-19 ENCOUNTER — Emergency Department (HOSPITAL_COMMUNITY): Payer: 59

## 2022-12-19 ENCOUNTER — Emergency Department (HOSPITAL_COMMUNITY)
Admission: EM | Admit: 2022-12-19 | Discharge: 2022-12-19 | Disposition: A | Discharge status: Home / Self Care | Payer: 59 | Attending: Emergency Medicine | Admitting: Emergency Medicine

## 2022-12-19 ENCOUNTER — Encounter (HOSPITAL_COMMUNITY): Payer: Self-pay | Admitting: Emergency Medicine

## 2022-12-19 ENCOUNTER — Other Ambulatory Visit: Payer: Self-pay

## 2022-12-19 ENCOUNTER — Encounter: Payer: 59 | Admitting: Obstetrics and Gynecology

## 2022-12-19 DIAGNOSIS — G5712 Meralgia paresthetica, left lower limb: Secondary | ICD-10-CM | POA: Insufficient documentation

## 2022-12-19 DIAGNOSIS — E119 Type 2 diabetes mellitus without complications: Secondary | ICD-10-CM | POA: Diagnosis not present

## 2022-12-19 DIAGNOSIS — M79605 Pain in left leg: Secondary | ICD-10-CM | POA: Diagnosis present

## 2022-12-19 LAB — CBC
HCT: 43.6 % (ref 36.0–46.0)
Hemoglobin: 14.2 g/dL (ref 12.0–15.0)
MCH: 29 pg (ref 26.0–34.0)
MCHC: 32.6 g/dL (ref 30.0–36.0)
MCV: 89 fL (ref 80.0–100.0)
Platelets: 258 10*3/uL (ref 150–400)
RBC: 4.9 MIL/uL (ref 3.87–5.11)
RDW: 12.7 % (ref 11.5–15.5)
WBC: 6.3 10*3/uL (ref 4.0–10.5)
nRBC: 0 % (ref 0.0–0.2)

## 2022-12-19 LAB — BASIC METABOLIC PANEL
Anion gap: 8 (ref 5–15)
BUN: 16 mg/dL (ref 6–20)
CO2: 24 mmol/L (ref 22–32)
Calcium: 8.9 mg/dL (ref 8.9–10.3)
Chloride: 104 mmol/L (ref 98–111)
Creatinine, Ser: 0.93 mg/dL (ref 0.44–1.00)
GFR, Estimated: >60 mL/min (ref >60)
Glucose, Bld: 141 mg/dL — ABNORMAL HIGH (ref 70–99)
Potassium: 3.8 mmol/L (ref 3.5–5.1)
Sodium: 136 mmol/L (ref 135–145)

## 2022-12-19 MED ORDER — METHYLPREDNISOLONE 4 MG PO TBPK: ORAL_TABLET | ORAL | 0 refills | Status: DC

## 2022-12-19 NOTE — ED Provider Notes (Signed)
[facility] [facility] Provider Note   CSN: [id number] Arrival date & time: 12/19/22  1233     History  Chief Complaint  Patient presents with   Leg Pain    Terri [name] is a 57 y.o. female on ozempic who presents with Left lateral leg pain. She reports intermittent and now constant burning pain in the left lateral upper thigh. She had recent travel to [place] and was concerned she could have a blood clot because of the increased risk with her Ozempic use.  She came to the emergency department specifically to rule out DVT.  She was seen at urgent care and told that she had a muscle spasm.  She was given 3 oral prednisone tablets once, a muscle relaxer, and a cream for her leg.  She denies a rash, weakness, back pain, saddle anesthesia or loss of bowel or bladder control.  At first the pain was worse with stretching but now is constant.   Leg Pain      Home Medications Prior to Admission medications   Medication Sig Start Date End Date Taking? Authorizing Provider  albuterol (PROVENTIL HFA;VENTOLIN HFA) 108 (90 BASE) MCG/ACT inhaler Inhale 2 puffs into the lungs every 6 (six) hours as needed for wheezing or shortness of breath.    [provider]  AMBULATORY NON FORMULARY MEDICATION Medication Name: tool softner + laxative 1 po daily    [provider]  calcium carbonate (TUMS - DOSED IN MG ELEMENTAL CALCIUM) 500 MG chewable tablet Chew 2 tablets by mouth as needed.    [provider]  diazepam (VALIUM) 5 MG tablet Take 1 tablet by mouth as needed. 08/16/14   [provider]  escitalopram (LEXAPRO) 20 MG tablet Take 20 mg by mouth daily.    [provider]  ipratropium-albuterol (DUONEB) 0.5-2.5 (3) MG/3ML SOLN Inhale 3 mLs into the lungs every 6 (six) hours as needed.  09/10/12   [provider]  levocetirizine (XYZAL) 5 MG tablet Take 1 tablet (5 mg total) by mouth every evening. 08/07/20   Bing Neighbors, NP  meclizine (ANTIVERT) 25 MG tablet Take 1 tablet (25 mg total) by mouth 3 (three) times daily as needed for dizziness. 02/03/21   Geoffery Lyons, MD  nitrofurantoin, macrocrystal-monohydrate, (MACROBID) 100 MG capsule Take 1 capsule (100 mg total) by mouth 2 (two) times daily. 10/08/22   Leath-Warren, Sadie Haber, NP  rosuvastatin (CRESTOR) 40 MG tablet Take 40 mg by mouth daily. 08/19/21   [provider]  tiZANidine (ZANAFLEX) 2 MG tablet Take 2 mg by mouth every 6 (six) hours as needed for muscle spasms.    [provider]  tiZANidine (ZANAFLEX) 4 MG tablet Take 4 mg by mouth 3 (three) times daily as needed. 06/16/21   [provider]  Vitamin D, Ergocalciferol, (DRISDOL) 1.25 MG (50000 UT) CAPS capsule Take 1 capsule by mouth once a week. 12/31/18   [provider]      Allergies    Sulfa antibiotics, Iodine, Cephalexin, Ciprofloxacin hcl, and Penicillins    Review of Systems   Review of Systems  Physical Exam Updated Vital Signs BP 114/61 (BP Location: Right Arm)   Pulse 71   Temp 98.2 F (36.8 C) (Oral)   Resp (!) 21   Ht 5\' 6"  (1.676 m)   Wt 76.2 kg   LMP 07/30/2010   SpO2 100%   BMI 27.12 kg/m  Physical Exam Vitals and nursing note reviewed.  Constitutional:      General: She is not in acute distress.    Appearance: She is well-developed. She is not diaphoretic.  HENT:     Head: Normocephalic and atraumatic.     Right Ear: External ear normal.     Left Ear: External ear normal.     Nose: Nose normal.     Mouth/Throat:     Mouth: Mucous membranes are moist.  Eyes:     General: No scleral icterus.    Conjunctiva/sclera: Conjunctivae normal.  Cardiovascular:     Rate and Rhythm: Normal rate and regular rhythm.     Heart sounds: Normal heart sounds. No murmur heard.    No friction rub. No gallop.  Pulmonary:     Effort: Pulmonary effort is normal. No respiratory distress.     Breath sounds: Normal breath sounds.   Abdominal:     General: Bowel sounds are normal. There is no distension.     Palpations: Abdomen is soft. There is no mass.     Tenderness: There is no abdominal tenderness. There is no guarding.  Musculoskeletal:     Cervical back: Normal range of motion.  Skin:    General: Skin is warm and dry.  Neurological:     General: No focal deficit present.     Mental Status: She is alert. Mental status is at baseline. She is disoriented.     Cranial Nerves: No cranial nerve deficit.     Sensory: No sensory deficit.     Motor: No weakness.     Coordination: Coordination normal.     Gait: Gait normal.     Deep Tendon Reflexes: Reflexes normal.  Psychiatric:        Behavior: Behavior normal.     ED Results / Procedures / Treatments   Labs (all labs ordered are listed, but only abnormal results are displayed) Labs Reviewed  BASIC METABOLIC PANEL - Abnormal; Notable for the following components:      Result Value   Glucose, Bld 141 (*)    All other components within normal limits  CBC    EKG None  Radiology No results found.  Procedures Procedures    Medications Ordered in ED Medications - No data to display  ED Course/ Medical Decision Making/ A&P                             Medical Decision Making Amount and/or Complexity of Data Reviewed Labs: ordered. Radiology: ordered.   Patient workup is negative for any acute findings today.  Her symptoms are consistent with a diagnosis of meralgia paresthetica.  She is diabetic.  I suspect she would do well with a steroid pack and will give her a printed prescription and she may touch base with her PCP to see if they think this would be appropriate given her history of diabetes.  Otherwise she may take anti-inflammatories as needed.  She may follow-up with her PCP.  Suspect that physical therapy may be helpful.  She appears otherwise appropriate for discharge without evidence of DVT or herpes zoster.        Final  Clinical Impression(s) / ED Diagnoses Final diagnoses:  Meralgia paresthetica of left side    Rx / DC Orders ED Discharge Orders     None         Arthor Captain, PA-C 12/19/22 1546    Pricilla Loveless, MD 12/19/22 [phone]

## 2022-12-19 NOTE — Progress Notes (Deleted)
   ANNUAL EXAM Patient name: Terri [name] MRN [id number]  Date of birth: April 29, 1966 Chief Complaint:   No chief complaint on file.  History of Present Illness:   Terri [name] is a 57 y.o. No obstetric history on file. female being seen today for a routine annual exam.   Current complaints: ***   Patient's last menstrual period was 07/30/2010.   Last MXR: 04/2021 Last Pap/Pap History: None in the last 5 years ***. Results were: {Pap findings:25134}. H/O abnormal pap: {yes/yes***/no:23866}   Health Maintenance Due  Topic Date Due   HEMOGLOBIN A1C  Never done   FOOT EXAM  Never done   OPHTHALMOLOGY EXAM  Never done   HIV Screening  Never done   Diabetic kidney evaluation - Urine ACR  Never done   Hepatitis C Screening  Never done   DTaP/Tdap/Td (1 - Tdap) Never done   PAP SMEAR-Modifier  Never done   Zoster Vaccines- Shingrix (1 of 2) Never done   Medicare Annual Wellness (AWV)  03/22/2016   COVID-19 Vaccine (1 - 2023-24 season) Never done   Diabetic kidney evaluation - eGFR measurement  05/31/2022    Review of Systems:   Pertinent items are noted in HPI Denies any headaches, blurred vision, fatigue, shortness of breath, chest pain, abdominal pain, abnormal vaginal discharge/itching/odor/irritation, problems with periods, bowel movements, urination, or intercourse unless otherwise stated above. *** Pertinent History Reviewed:  Reviewed past medical,surgical, social and family history.  Reviewed problem list, medications and allergies. Physical Assessment:  There were no vitals filed for this visit.There is no height or weight on file to calculate BMI.   Physical Examination:  General appearance - well appearing, and in no distress Mental status - alert, oriented to person, place, and time Psych:  She has a normal mood and affect Skin - warm and dry, normal color, no suspicious lesions noted Chest - effort normal Heart - normal rate  Breasts - breasts appear  normal, no suspicious masses, no skin or nipple changes or axillary nodes Abdomen - soft, nontender, nondistended, no masses or organomegaly Pelvic -  VULVA: normal appearing vulva with no masses, tenderness or lesions  VAGINA: normal appearing vagina with normal color and discharge, no lesions  CERVIX: normal appearing cervix without discharge or lesions, no CMT UTERUS: uterus is felt to be normal size, shape, consistency and nontender  ADNEXA: No adnexal masses or tenderness noted. Extremities:  No swelling or varicosities noted  Chaperone present for exam  No results found for this or any previous visit (from the past 24 hour(s)).  Assessment & Plan:  Diagnoses and all orders for this visit:  Encounter for annual routine gynecological examination  - Cervical cancer screening: Discussed guidelines. Pap with HPV done - Breast Health: Encouraged self breast awareness/SBE. Discussed limits of clinical breast exam for detecting breast cancer. Discussed importance of annual MXR. Rx given for MXR - Climacteric/Sexual health: Reviewed typical and atypical symptoms of menopause/peri-menopause. Discussed PMB and to call if any amount of spotting.  - Colonoscopy: {Blank single:19197::"Per PCP","up to date","declines"} - F/U 12 months and prn     No orders of the defined types were placed in this encounter.   Meds: No orders of the defined types were placed in this encounter.   Follow-up: No follow-ups on file.  Milas Hock, MD 12/19/2022 1:06 PM

## 2022-12-19 NOTE — ED Triage Notes (Addendum)
Pt via POV c/o several days of burning pain to left lateral thigh, worse in the morning when stretching her legs and worse again since this morning. Pt notes slight SOB feeling this morning also. No hx sciatica, back pain, DVT, no trauma. Pt ambulatory w/o assistance and in NAD in triage. Pt notes she had several teeth pulled a few weeks ago and drove to and from [place] this past weekend.

## 2022-12-19 NOTE — Discharge Instructions (Addendum)
SEEK IMMEDIATE MEDICAL ATTENTION IF: Ne problem with the use of your arms or legs.  Severe back pain not relieved with medications.  Change in bowel or bladder control.  Increasing pain in any areas of the body (such as chest or abdominal pain).  Shortness of breath, dizziness or fainting.  Nausea (feeling sick to your stomach), vomiting, fever, or sweats.

## 2023-04-15 ENCOUNTER — Other Ambulatory Visit (HOSPITAL_COMMUNITY): Payer: Self-pay | Admitting: Adult Health

## 2023-04-15 DIAGNOSIS — Z1231 Encounter for screening mammogram for malignant neoplasm of breast: Secondary | ICD-10-CM

## 2023-04-17 ENCOUNTER — Encounter (HOSPITAL_COMMUNITY): Payer: Self-pay

## 2023-04-17 ENCOUNTER — Ambulatory Visit (HOSPITAL_COMMUNITY)
Admission: RE | Admit: 2023-04-17 | Discharge: 2023-04-17 | Disposition: A | Discharge status: Home / Self Care | Payer: 59 | Source: Ambulatory Visit | Attending: Adult Health | Admitting: Adult Health

## 2023-04-17 DIAGNOSIS — Z1231 Encounter for screening mammogram for malignant neoplasm of breast: Secondary | ICD-10-CM | POA: Insufficient documentation

## 2023-05-24 ENCOUNTER — Other Ambulatory Visit (HOSPITAL_COMMUNITY): Payer: Self-pay | Admitting: Adult Health

## 2023-05-24 DIAGNOSIS — Z13820 Encounter for screening for osteoporosis: Secondary | ICD-10-CM

## 2023-05-24 DIAGNOSIS — Z1231 Encounter for screening mammogram for malignant neoplasm of breast: Secondary | ICD-10-CM

## 2023-06-23 ENCOUNTER — Ambulatory Visit: Payer: Self-pay

## 2023-07-06 ENCOUNTER — Telehealth: Payer: 59

## 2023-07-08 ENCOUNTER — Ambulatory Visit
Admission: RE | Admit: 2023-07-08 | Discharge: 2023-07-08 | Disposition: A | Discharge status: Home / Self Care | Payer: 59 | Source: Ambulatory Visit

## 2023-07-08 VITALS — BP 114/71 | HR 79 | Temp 100.9°F | Resp 16 | Ht 66.0 in | Wt 155.0 lb

## 2023-07-08 DIAGNOSIS — J441 Chronic obstructive pulmonary disease with (acute) exacerbation: Secondary | ICD-10-CM

## 2023-07-08 DIAGNOSIS — J111 Influenza due to unidentified influenza virus with other respiratory manifestations: Secondary | ICD-10-CM | POA: Diagnosis not present

## 2023-07-08 LAB — POC COVID19/FLU A&B COMBO
Covid Antigen, POC: NEGATIVE
Influenza A Antigen, POC: NEGATIVE
Influenza B Antigen, POC: NEGATIVE

## 2023-07-08 MED ORDER — IPRATROPIUM-ALBUTEROL 0.5-2.5 (3) MG/3ML IN SOLN: 3.0000 mL | Freq: Four times a day (QID) | RESPIRATORY_TRACT | 0 refills | Status: DC | PRN

## 2023-07-08 MED ORDER — OSELTAMIVIR PHOSPHATE 75 MG PO CAPS: 75.0000 mg | ORAL_CAPSULE | Freq: Two times a day (BID) | ORAL | 0 refills | Status: DC

## 2023-07-08 MED ORDER — AZITHROMYCIN 250 MG PO TABS: ORAL_TABLET | ORAL | 0 refills | Status: DC

## 2023-07-08 MED ORDER — PREDNISONE 20 MG PO TABS: 20.0000 mg | ORAL_TABLET | Freq: Two times a day (BID) | ORAL | 0 refills | Status: AC

## 2023-07-08 NOTE — Discharge Instructions (Addendum)
Treating you like a flu like illness. Start Tamiflu and prednisone. Take Azithromycin after completing Tamiflu if you continue to cough and have wheezing. Restart Neb treatments every 6 hours. Emergency Department if symptoms of breathing worsen or do not respond to medication.

## 2023-07-08 NOTE — ED Triage Notes (Signed)
Patient here today with c/o cough, wheeze, ST, fever, chills, and body aches since Friday night. Symptoms have been worsening since. She has taken Nyquil with no relief. Her husband was also sick.

## 2023-07-08 NOTE — ED Provider Notes (Incomplete)
[facility]    CSN: [id number] Arrival date & time: 07/08/23  1756      History   Chief Complaint Chief Complaint  Patient presents with   Wheezing    Coughing low fever sore throat - Entered by patient    HPI LUCELLA POMMIER is a 58 y.o. female.   Patient with history of asthma, COPD presents today with a 3-day history of sore throat, wheezing, cough, fever and chills and bodyaches.  Patient been taking NyQuil without relief.  Patient endorses that she has had worsening of wheezing.  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   COPD exacerbation (HCC) 08/19/2012   Diabetes (HCC) 08/19/2012   Foreign body in stomach 01/02/2012   Dysphagia 11/17/2011    Past Surgical History:  Procedure Laterality Date   BLADDER SURGERY     ESOPHAGOGASTRODUODENOSCOPY  11/17/2011   Procedure: ESOPHAGOGASTRODUODENOSCOPY (EGD);  Surgeon: Rachael Fee, MD;  Location: [facility] ENDOSCOPY;  Service: Endoscopy;  Laterality: N/A;   ESOPHAGOGASTRODUODENOSCOPY  01/02/2012   Procedure: ESOPHAGOGASTRODUODENOSCOPY (EGD);  Surgeon: Rachael Fee, MD;  Location: [facility] ENDOSCOPY;  Service: Endoscopy;  Laterality: N/A;   ESOPHAGOGASTRODUODENOSCOPY N/A 01/22/2013   Procedure: ESOPHAGOGASTRODUODENOSCOPY (EGD);  Surgeon: Beverley Fiedler, MD;  Location: [facility] ENDOSCOPY;  Service: Gastroenterology;  Laterality: N/A;   ESOPHAGOGASTRODUODENOSCOPY N/A 10/22/2013   Procedure: ESOPHAGOGASTRODUODENOSCOPY (EGD);  Surgeon: Meryl Dare, MD;  Location: [facility] ENDOSCOPY;  Service:  Endoscopy;  Laterality: N/A;   HERNIA REPAIR     LAPAROSCOPIC GASTRIC BAND REMOVAL WITH LAPAROSCOPIC GASTRIC SLEEVE RESECTION  2016   LAPAROSCOPIC GASTRIC BANDING     TUBAL LIGATION     UPPER GASTROINTESTINAL ENDOSCOPY      OB History   No obstetric history on file.      Home Medications    Prior to Admission medications   Medication Sig Start Date End Date Taking? Authorizing Provider  OZEMPIC, 1 MG/DOSE, 4 MG/3ML SOPN Inject 1 mg into the skin once a week. 07/03/23  Yes [provider]  albuterol (PROVENTIL HFA;VENTOLIN HFA) 108 (90 BASE) MCG/ACT inhaler Inhale 2 puffs into the lungs every 6 (six) hours as needed for wheezing or shortness of breath.    [provider]  calcium carbonate (TUMS - DOSED IN MG ELEMENTAL CALCIUM) 500 MG chewable tablet Chew 2 tablets by mouth as needed.    [provider]  diazepam (VALIUM) 5 MG tablet Take 1 tablet by mouth as needed. 08/16/14   [provider]  escitalopram (LEXAPRO) 20 MG tablet Take 20 mg by mouth daily.    [provider]  ipratropium-albuterol (DUONEB) 0.5-2.5 (3) MG/3ML SOLN Inhale 3 mLs into the lungs every 6 (six) hours as needed.  09/10/12   [provider]  meclizine (ANTIVERT) 25 MG tablet Take 1 tablet (25 mg total) by mouth 3 (three) times daily as needed for dizziness. 02/03/21   Geoffery Lyons, MD  rosuvastatin (CRESTOR) 40 MG tablet Take 40 mg by mouth daily. 08/19/21  [provider]  tiZANidine (ZANAFLEX) 4 MG tablet Take 4 mg by mouth 3 (three) times daily as needed. 06/16/21   [provider]  Vitamin D, Ergocalciferol, (DRISDOL) 1.25 MG (50000 UT) CAPS capsule Take 1 capsule by mouth once a week. 12/31/18   [provider]    Family History Family History  Problem Relation Age of Onset   Breast cancer Mother    Diabetes Mother    COPD Mother    Heart disease Father        and Sister   Cirrhosis Father        Non-alcholic    Diabetes  Father    Hypertension Father    Kidney disease Father        chronic kidney stones   Diabetes Sister    Diabetes Brother    Colon cancer Neg Hx     Social History Social History   Tobacco Use   Smoking status: Former    Current packs/day: 0.00    Types: Cigarettes    Quit date: 11/16/2005    Years since quitting: 17.6   Smokeless tobacco: Never  Vaping Use   Vaping status: Never Used  Substance Use Topics   Alcohol use: Not Currently    Comment: occasion - once or twice a year   Drug use: No     Allergies   Sulfa antibiotics, Iodine, Cephalexin, Ciprofloxacin hcl, and Penicillins   Review of Systems Review of Systems  Respiratory:  Positive for wheezing.      Physical Exam Triage Vital Signs ED Triage Vitals  Encounter Vitals Group     BP 07/08/23 1839 114/71     Systolic BP Percentile --      Diastolic BP Percentile --      Pulse Rate 07/08/23 1839 79     Resp 07/08/23 1839 16     Temp 07/08/23 1839 (!) 100.9 F (38.3 C)     Temp Source 07/08/23 1839 Oral     SpO2 07/08/23 1839 96 %     Weight 07/08/23 1834 155 lb (70.3 kg)     Height 07/08/23 1834 5\' 6"  (1.676 m)     Head Circumference --      Peak Flow --      Pain Score 07/08/23 1833 8     Pain Loc --      Pain Education --      Exclude from Growth Chart --    No data found.  Updated Vital Signs BP 114/71 (BP Location: Right Arm)   Pulse 79   Temp (!) 100.9 F (38.3 C) (Oral)   Resp 16   Ht 5\' 6"  (1.676 m)   Wt 155 lb (70.3 kg)   LMP 07/30/2010   SpO2 96%   BMI 25.02 kg/m   Visual Acuity Right Eye Distance:   Left Eye Distance:   Bilateral Distance:    Right Eye Near:   Left Eye Near:    Bilateral Near:     Physical Exam   UC Treatments / Results  Labs (all labs ordered are listed, but only abnormal results are displayed) Labs Reviewed  POC COVID19/FLU A&B COMBO    EKG   Radiology No results found.  Procedures Procedures (including critical care  time)  Medications Ordered in UC Medications - No data to display  Initial Impression / Assessment and Plan / UC Course  I have reviewed the triage vital signs and the nursing notes.  Pertinent labs &  imaging results that were available during my care of the patient were reviewed by me and considered in my medical decision making (see chart for details).     *** Final Clinical Impressions(s) / UC Diagnoses   Final diagnoses:  None   Discharge Instructions   None    ED Prescriptions   None    PDMP not reviewed this encounter.

## 2023-11-19 ENCOUNTER — Ambulatory Visit
Admission: RE | Admit: 2023-11-19 | Discharge: 2023-11-19 | Disposition: A | Discharge status: Home / Self Care | Source: Ambulatory Visit | Attending: Nurse Practitioner

## 2023-11-19 VITALS — BP 104/64 | HR 62 | Temp 98.1°F | Resp 16

## 2023-11-19 DIAGNOSIS — R300 Dysuria: Secondary | ICD-10-CM | POA: Diagnosis present

## 2023-11-19 LAB — POCT URINALYSIS DIP (MANUAL ENTRY)
Bilirubin, UA: NEGATIVE
Blood, UA: NEGATIVE
Glucose, UA: NEGATIVE mg/dL
Ketones, POC UA: NEGATIVE mg/dL
Nitrite, UA: NEGATIVE
Protein Ur, POC: NEGATIVE mg/dL
Spec Grav, UA: 1.02 (ref 1.010–1.025)
Urobilinogen, UA: 2 U/dL — AB
pH, UA: 6.5 (ref 5.0–8.0)

## 2023-11-19 MED ORDER — NITROFURANTOIN MONOHYD MACRO 100 MG PO CAPS: 100.0000 mg | ORAL_CAPSULE | Freq: Two times a day (BID) | ORAL | 0 refills | Status: DC

## 2023-11-19 NOTE — ED Triage Notes (Signed)
 Pt presents to UC for c/o pain with urination x3 days. Pt took azo and 3 antibiotic pills.

## 2023-11-19 NOTE — Discharge Instructions (Signed)
 Take the rest of the course of Macrobid  as prescribed to treat for UTI.  We will contact you if the urine culture shows we need to change the antibiotic.

## 2023-11-19 NOTE — ED Provider Notes (Signed)
 [facility]    CSN: [id number] Arrival date & time: 11/19/23  1420      History   Chief Complaint Chief Complaint  Patient presents with   Urinary Retention    I need an antibiotic for a UTI - Entered by patient    HPI Terri [name] is a 58 y.o. female.   Patient presents today with 3-day history of dysuria.  She denies urinary frequency or urgency, new urinary incontinence, foul urinary odor, hematuria, abdominal or back pain, flank pain, fever, nausea/vomiting, and vaginal discharge.  Reports I know how it feels when I have a UTI.  Has taken Azo and leftover nitrofurantoin  which have helped with symptoms.  Reports she took 1 leftover nitrofurantoin  Sunday into yesterday.  She has not taking any today so far.  Reports history of frequent UTI.  Last UTI was a few months ago.    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   COPD exacerbation (HCC) 08/19/2012   Diabetes (HCC) 08/19/2012   Foreign body in stomach 01/02/2012   Dysphagia 11/17/2011    Past Surgical History:  Procedure Laterality Date   BLADDER SURGERY     ESOPHAGOGASTRODUODENOSCOPY  11/17/2011   Procedure: ESOPHAGOGASTRODUODENOSCOPY (EGD);  Surgeon: Janel Medford, MD;  Location: [facility] ENDOSCOPY;  Service: Endoscopy;  Laterality: N/A;   ESOPHAGOGASTRODUODENOSCOPY  01/02/2012   Procedure: ESOPHAGOGASTRODUODENOSCOPY (EGD);  Surgeon: Janel Medford, MD;  Location: [facility] ENDOSCOPY;  Service: Endoscopy;  Laterality: N/A;   ESOPHAGOGASTRODUODENOSCOPY N/A 01/22/2013    Procedure: ESOPHAGOGASTRODUODENOSCOPY (EGD);  Surgeon: Nannette Babe, MD;  Location: [facility] ENDOSCOPY;  Service: Gastroenterology;  Laterality: N/A;   ESOPHAGOGASTRODUODENOSCOPY N/A 10/22/2013   Procedure: ESOPHAGOGASTRODUODENOSCOPY (EGD);  Surgeon: Asencion Blacksmith, MD;  Location: [facility] ENDOSCOPY;  Service: Endoscopy;  Laterality: N/A;   HERNIA REPAIR     LAPAROSCOPIC GASTRIC BAND REMOVAL WITH LAPAROSCOPIC GASTRIC SLEEVE RESECTION  2016   LAPAROSCOPIC GASTRIC BANDING     TUBAL LIGATION     UPPER GASTROINTESTINAL ENDOSCOPY      OB History   No obstetric history on file.      Home Medications    Prior to Admission medications   Medication Sig Start Date End Date Taking? Authorizing Provider  nitrofurantoin , macrocrystal-monohydrate, (MACROBID ) 100 MG capsule Take 1 capsule (100 mg total) by mouth 2 (two) times daily. 11/19/23  Yes Wilhemena Harbour, NP  albuterol  (PROVENTIL  HFA;VENTOLIN  HFA) 108 (90 BASE) MCG/ACT inhaler Inhale 2 puffs into the lungs every 6 (six) hours as needed for wheezing or shortness of breath.    [provider]  calcium carbonate (TUMS - DOSED IN MG ELEMENTAL CALCIUM) 500 MG chewable tablet Chew 2 tablets by mouth as needed.    [provider]  diazepam (VALIUM) 5 MG tablet Take 1 tablet by mouth as needed. 08/16/14   [provider]  escitalopram (LEXAPRO) 20 MG tablet Take 20 mg by mouth daily.    [provider]  ipratropium-albuterol  (DUONEB) 0.5-2.5 (3) MG/3ML SOLN Take 3  mLs by nebulization every 6 (six) hours as needed. 07/08/23   Buena Carmine, NP  meclizine  (ANTIVERT ) 25 MG tablet Take 1 tablet (25 mg total) by mouth 3 (three) times daily as needed for dizziness. 02/03/21   Orvilla Blander, MD  oseltamivir  (TAMIFLU ) 75 MG capsule Take 1 capsule (75 mg total) by mouth 2 (two) times daily. 07/08/23   Buena Carmine, NP  OZEMPIC, 1 MG/DOSE, 4 MG/3ML SOPN Inject 1 mg into the skin once a week. 07/03/23   [provider]   rosuvastatin (CRESTOR) 40 MG tablet Take 40 mg by mouth daily. 08/19/21   [provider]  tiZANidine (ZANAFLEX) 4 MG tablet Take 4 mg by mouth 3 (three) times daily as needed. 06/16/21   [provider]  Vitamin D, Ergocalciferol, (DRISDOL) 1.25 MG (50000 UT) CAPS capsule Take 1 capsule by mouth once a week. 12/31/18   [provider]    Family History Family History  Problem Relation Age of Onset   Breast cancer Mother    Diabetes Mother    COPD Mother    Heart disease Father        and Sister   Cirrhosis Father        Non-alcholic    Diabetes Father    Hypertension Father    Kidney disease Father        chronic kidney stones   Diabetes Sister    Diabetes Brother    Colon cancer Neg Hx     Social History Social History   Tobacco Use   Smoking status: Former    Current packs/day: 0.00    Types: Cigarettes    Quit date: 11/16/2005    Years since quitting: 18.0   Smokeless tobacco: Never  Vaping Use   Vaping status: Never Used  Substance Use Topics   Alcohol use: Not Currently    Comment: occasion - once or twice a year   Drug use: No     Allergies   Sulfa antibiotics, Iodine, Cephalexin, Ciprofloxacin hcl, and Penicillins   Review of Systems Review of Systems Per HPI  Physical Exam Triage Vital Signs ED Triage Vitals [11/19/23 1437]  Encounter Vitals Group     BP 104/64     Girls Systolic BP Percentile      Girls Diastolic BP Percentile      Boys Systolic BP Percentile      Boys Diastolic BP Percentile      Pulse Rate 62     Resp 16     Temp 98.1 F (36.7 C)     Temp Source Oral     SpO2 98 %     Weight      Height      Head Circumference      Peak Flow      Pain Score      Pain Loc      Pain Education      Exclude from Growth Chart    No data found.  Updated Vital Signs BP 104/64 (BP Location: Right Arm)   Pulse 62   Temp 98.1 F (36.7 C) (Oral)   Resp 16   LMP 07/30/2010   SpO2 98%   Visual Acuity Right  Eye Distance:   Left Eye Distance:   Bilateral Distance:    Right Eye Near:   Left Eye Near:    Bilateral Near:     Physical Exam Vitals and nursing note reviewed.  Constitutional:  General: She is not in acute distress.    Appearance: Normal appearance. She is not toxic-appearing.  Pulmonary:     Effort: Pulmonary effort is normal. No respiratory distress.   Skin:    General: Skin is warm and dry.     Coloration: Skin is not jaundiced or pale.     Findings: No erythema.   Neurological:     Mental Status: She is alert and oriented to person, place, and time.     Motor: No weakness.     Gait: Gait normal.   Psychiatric:        Mood and Affect: Mood normal.        Behavior: Behavior is cooperative.      UC Treatments / Results  Labs (all labs ordered are listed, but only abnormal results are displayed) Labs Reviewed  POCT URINALYSIS DIP (MANUAL ENTRY) - Abnormal; Notable for the following components:      Result Value   Color, UA straw (*)    Clarity, UA hazy (*)    Urobilinogen, UA 2.0 (*)    Leukocytes, UA Small (1+) (*)    All other components within normal limits  URINE CULTURE    EKG   Radiology No results found.  Procedures Procedures (including critical care time)  Medications Ordered in UC Medications - No data to display  Initial Impression / Assessment and Plan / UC Course  I have reviewed the triage vital signs and the nursing notes.  Pertinent labs & imaging results that were available during my care of the patient were reviewed by me and considered in my medical decision making (see chart for details).  Patient is well-appearing, normotensive, afebrile, not tachycardic, not tachypneic, oxygenating well on room air.   1. Dysuria Urinalysis today is likely affected and unreliable due to Azo use and antibiotic use Urine culture is pending, again may be unreliable due to Macrobid  use Will treat with the rest of the course of Macrobid   which is 7 more pills Recommended hydration with plenty of fluids Return and ER precautions discussed with patient  The patient was given the opportunity to ask questions.  All questions answered to their satisfaction.  The patient is in agreement to this plan.   Final Clinical Impressions(s) / UC Diagnoses   Final diagnoses:  Dysuria     Discharge Instructions      Take the rest of the course of Macrobid  as prescribed to treat for UTI.  We will contact you if the urine culture shows we need to change the antibiotic.       ED Prescriptions     Medication Sig Dispense Auth. Provider   nitrofurantoin , macrocrystal-monohydrate, (MACROBID ) 100 MG capsule Take 1 capsule (100 mg total) by mouth 2 (two) times daily. 7 capsule Wilhemena Harbour, NP      PDMP not reviewed this encounter.   Wilhemena Harbour, NP 11/19/23 [phone]

## 2023-11-21 LAB — URINE CULTURE: Culture: 20000 — AB

## 2023-11-22 ENCOUNTER — Ambulatory Visit (HOSPITAL_COMMUNITY): Payer: Self-pay

## 2024-01-24 ENCOUNTER — Ambulatory Visit
Admission: RE | Admit: 2024-01-24 | Discharge: 2024-01-24 | Disposition: A | Discharge status: Home / Self Care | Source: Ambulatory Visit | Attending: Family Medicine | Admitting: Family Medicine

## 2024-01-24 ENCOUNTER — Encounter: Payer: Self-pay | Admitting: Radiology

## 2024-01-24 VITALS — BP 125/78 | HR 70 | Temp 98.3°F | Resp 16

## 2024-01-24 DIAGNOSIS — N390 Urinary tract infection, site not specified: Secondary | ICD-10-CM | POA: Diagnosis not present

## 2024-01-24 LAB — POCT URINE DIPSTICK
Blood, UA: NEGATIVE
Glucose, UA: NEGATIVE mg/dL
Nitrite, UA: NEGATIVE
Spec Grav, UA: >=1.03 — AB (ref 1.010–1.025)
Urobilinogen, UA: 1 U/dL
pH, UA: 5.5 (ref 5.0–8.0)

## 2024-01-24 MED ORDER — NITROFURANTOIN MONOHYD MACRO 100 MG PO CAPS: 100.0000 mg | ORAL_CAPSULE | Freq: Two times a day (BID) | ORAL | 0 refills | Status: DC

## 2024-01-24 NOTE — Discharge Instructions (Signed)
 We will give you a call if your urine culture tells us  we need to make any changes to your antibiotic.  You may continue Azo as needed, drink plenty of fluids, return for worsening or unresolving symptoms.

## 2024-01-24 NOTE — ED Triage Notes (Signed)
 Pt states she was treated for a UTI last week states she is still having urinary frequency and burning with urination.

## 2024-01-24 NOTE — ED Provider Notes (Signed)
[facility]    CSN: [id number] Arrival date & time: 01/24/24  1152      History   Chief Complaint Chief Complaint  Patient presents with   Urinary Frequency    Uti - Entered by patient    HPI Terri [name] is a 58 y.o. female.   Patient presenting today with ongoing urinary frequency, dysuria after finishing a course of Macrobid  given by PCP last week for a UTI.  She states that the urine culture came back showing multiple species and requesting recollection but she did not get a chance to do so.  She denies fever, chills, flank pain, hematuria, nausea, vomiting.  History of recurrent UTIs and has antibiotic allergies to all typical urinary antibiotics apart from the Macrobid .    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   COPD exacerbation (HCC) 08/19/2012   Diabetes (HCC) 08/19/2012   Foreign body in stomach 01/02/2012   Dysphagia 11/17/2011    Past Surgical History:  Procedure Laterality Date   BLADDER SURGERY     ESOPHAGOGASTRODUODENOSCOPY  11/17/2011   Procedure: ESOPHAGOGASTRODUODENOSCOPY (EGD);  Surgeon: Toribio SHAUNNA Cedar, MD;  Location: [facility] ENDOSCOPY;  Service: Endoscopy;  Laterality: N/A;   ESOPHAGOGASTRODUODENOSCOPY  01/02/2012   Procedure: ESOPHAGOGASTRODUODENOSCOPY (EGD);  Surgeon: Toribio SHAUNNA Cedar, MD;  Location: [facility] ENDOSCOPY;  Service: Endoscopy;  Laterality: N/A;   ESOPHAGOGASTRODUODENOSCOPY N/A 01/22/2013   Procedure: ESOPHAGOGASTRODUODENOSCOPY (EGD);  Surgeon: Gordy CHRISTELLA Starch, MD;  Location: [facility] ENDOSCOPY;   Service: Gastroenterology;  Laterality: N/A;   ESOPHAGOGASTRODUODENOSCOPY N/A 10/22/2013   Procedure: ESOPHAGOGASTRODUODENOSCOPY (EGD);  Surgeon: Gwendlyn ONEIDA Buddy, MD;  Location: [facility] ENDOSCOPY;  Service: Endoscopy;  Laterality: N/A;   HERNIA REPAIR     LAPAROSCOPIC GASTRIC BAND REMOVAL WITH LAPAROSCOPIC GASTRIC SLEEVE RESECTION  2016   LAPAROSCOPIC GASTRIC BANDING     TUBAL LIGATION     UPPER GASTROINTESTINAL ENDOSCOPY      OB History   No obstetric history on file.      Home Medications    Prior to Admission medications   Medication Sig Start Date End Date Taking? Authorizing Provider  nitrofurantoin , macrocrystal-monohydrate, (MACROBID ) 100 MG capsule Take 1 capsule (100 mg total) by mouth 2 (two) times daily. 01/24/24  Yes Stuart Vernell Norris, PA-C  albuterol  (PROVENTIL  HFA;VENTOLIN  HFA) 108 (90 BASE) MCG/ACT inhaler Inhale 2 puffs into the lungs every 6 (six) hours as needed for wheezing or shortness of breath.    [provider]  calcium carbonate (TUMS - DOSED IN MG ELEMENTAL CALCIUM) 500 MG chewable tablet Chew 2 tablets by mouth as needed.    [provider]  diazepam (VALIUM) 5 MG tablet Take 1 tablet by mouth as needed. 08/16/14   [provider]  escitalopram (LEXAPRO) 20 MG tablet Take 20 mg by mouth daily.    [provider]  ipratropium-albuterol  (DUONEB) 0.5-2.5 (3) MG/3ML SOLN Take 3 mLs by nebulization every 6 (six) hours as needed. 07/08/23   Arloa Suzen RAMAN, NP  meclizine  (ANTIVERT ) 25 MG  tablet Take 1 tablet (25 mg total) by mouth 3 (three) times daily as needed for dizziness. 02/03/21   Geroldine Berg, MD  nitrofurantoin , macrocrystal-monohydrate, (MACROBID ) 100 MG capsule Take 1 capsule (100 mg total) by mouth 2 (two) times daily. 11/19/23   Chandra Harlene LABOR, NP  oseltamivir  (TAMIFLU ) 75 MG capsule Take 1 capsule (75 mg total) by mouth 2 (two) times daily. 07/08/23   Arloa Suzen RAMAN, NP  OZEMPIC, 1 MG/DOSE, 4 MG/3ML SOPN Inject 1  mg into the skin once a week. 07/03/23   [provider]  rosuvastatin (CRESTOR) 40 MG tablet Take 40 mg by mouth daily. 08/19/21   [provider]  tiZANidine (ZANAFLEX) 4 MG tablet Take 4 mg by mouth 3 (three) times daily as needed. 06/16/21   [provider]  Vitamin D, Ergocalciferol, (DRISDOL) 1.25 MG (50000 UT) CAPS capsule Take 1 capsule by mouth once a week. 12/31/18   [provider]    Family History Family History  Problem Relation Age of Onset   Breast cancer Mother    Diabetes Mother    COPD Mother    Heart disease Father        and Sister   Cirrhosis Father        Non-alcholic    Diabetes Father    Hypertension Father    Kidney disease Father        chronic kidney stones   Diabetes Sister    Diabetes Brother    Colon cancer Neg Hx     Social History Social History   Tobacco Use   Smoking status: Former    Current packs/day: 0.00    Types: Cigarettes    Quit date: 11/16/2005    Years since quitting: 18.2   Smokeless tobacco: Never  Vaping Use   Vaping status: Never Used  Substance Use Topics   Alcohol use: Not Currently    Comment: occasion - once or twice a year   Drug use: No     Allergies   Sulfa antibiotics, Iodine, Cephalexin, Ciprofloxacin hcl, and Penicillins   Review of Systems Review of Systems PER HPI  Physical Exam Triage Vital Signs ED Triage Vitals  Encounter Vitals Group     BP 01/24/24 1156 125/78     Girls Systolic BP Percentile --      Girls Diastolic BP Percentile --      Boys Systolic BP Percentile --      Boys Diastolic BP Percentile --      Pulse Rate 01/24/24 1156 70     Resp 01/24/24 1156 16     Temp 01/24/24 1156 98.3 F (36.8 C)     Temp Source 01/24/24 1156 Oral     SpO2 01/24/24 1156 98 %     Weight --      Height --      Head Circumference --      Peak Flow --      Pain Score 01/24/24 1200 0     Pain Loc --      Pain Education --      Exclude from Growth Chart --    No  data found.  Updated Vital Signs BP 125/78 (BP Location: Left Arm)   Pulse 70   Temp 98.3 F (36.8 C) (Oral)   Resp 16   LMP 07/30/2010   SpO2 98%   Visual Acuity Right Eye Distance:   Left Eye Distance:   Bilateral Distance:    Right Eye Near:  Left Eye Near:    Bilateral Near:     Physical Exam Vitals and nursing note reviewed.  Constitutional:      Appearance: Normal appearance. She is not ill-appearing.  HENT:     Head: Atraumatic.  Eyes:     Extraocular Movements: Extraocular movements intact.     Conjunctiva/sclera: Conjunctivae normal.  Cardiovascular:     Rate and Rhythm: Normal rate.  Pulmonary:     Effort: Pulmonary effort is normal.  Abdominal:     General: Bowel sounds are normal. There is no distension.     Palpations: Abdomen is soft.     Tenderness: There is no abdominal tenderness. There is no right CVA tenderness, left CVA tenderness or guarding.  Musculoskeletal:        General: Normal range of motion.     Cervical back: Normal range of motion and neck supple.  Skin:    General: Skin is warm and dry.  Neurological:     Mental Status: She is alert and oriented to person, place, and time.  Psychiatric:        Mood and Affect: Mood normal.        Thought Content: Thought content normal.        Judgment: Judgment normal.    UC Treatments / Results  Labs (all labs ordered are listed, but only abnormal results are displayed) Labs Reviewed  POCT URINE DIPSTICK - Abnormal; Notable for the following components:      Result Value   Bilirubin, UA small (*)    Ketones, POC UA trace (5) (*)    Spec Grav, UA >=1.030 (*)    Leukocytes, UA Trace (*)    All other components within normal limits  URINE CULTURE    EKG   Radiology No results found.  Procedures Procedures (including critical care time)  Medications Ordered in UC Medications - No data to display  Initial Impression / Assessment and Plan / UC Course  I have reviewed the triage  vital signs and the nursing notes.  Pertinent labs & imaging results that were available during my care of the patient were reviewed by me and considered in my medical decision making (see chart for details).     Trace leukocytes on urinalysis and ongoing symptoms.  Urine culture pending.  Discussed with patient that there may be some resistance issues with the Macrobid  as this did not resolve her symptoms from her recently diagnosed UTI, but given her multiple antibiotic allergies she wishes to try the Macrobid  again as she knows she tolerates this well.  Did offer her fosfomycin but she states she is going on a cruise tomorrow and does not want to try new medication while out of town.  Push fluids, Azo as needed.  Return for worsening symptoms.  Final Clinical Impressions(s) / UC Diagnoses   Final diagnoses:  Acute lower UTI     Discharge Instructions      We will give you a call if your urine culture tells us  we need to make any changes to your antibiotic.  You may continue Azo as needed, drink plenty of fluids, return for worsening or unresolving symptoms.    ED Prescriptions     Medication Sig Dispense Auth. Provider   nitrofurantoin , macrocrystal-monohydrate, (MACROBID ) 100 MG capsule Take 1 capsule (100 mg total) by mouth 2 (two) times daily. 14 capsule Stuart Vernell Norris, [place]      PDMP not reviewed this encounter.   Stuart Vernell Norris, [place] 01/24/24  1253  

## 2024-01-27 ENCOUNTER — Ambulatory Visit (HOSPITAL_COMMUNITY): Payer: Self-pay

## 2024-01-27 LAB — URINE CULTURE: Culture: 20000 — AB

## 2024-03-28 ENCOUNTER — Telehealth: Admitting: Physician Assistant

## 2024-03-28 DIAGNOSIS — R3989 Other symptoms and signs involving the genitourinary system: Secondary | ICD-10-CM

## 2024-03-28 MED ORDER — NITROFURANTOIN MONOHYD MACRO 100 MG PO CAPS: 100.0000 mg | ORAL_CAPSULE | Freq: Two times a day (BID) | ORAL | 0 refills | Status: AC

## 2024-03-28 NOTE — Progress Notes (Signed)
 Virtual Visit Consent   Terri [name], you are scheduled for a virtual visit with a [facility] provider today. Just as with appointments in the office, your consent must be obtained to participate. Your consent will be active for this visit and any virtual visit you may have with one of our providers in the next 365 days. If you have a MyChart account, a copy of this consent can be sent to you electronically.  As this is a virtual visit, video technology does not allow for your provider to perform a traditional examination. This may limit your provider's ability to fully assess your condition. If your provider identifies any concerns that need to be evaluated in person or the need to arrange testing (such as labs, EKG, etc.), we will make arrangements to do so. Although advances in technology are sophisticated, we cannot ensure that it will always work on either your end or our end. If the connection with a video visit is poor, the visit may have to be switched to a telephone visit. With either a video or telephone visit, we are not always able to ensure that we have a secure connection.  By engaging in this virtual visit, you consent to the provision of healthcare and authorize for your insurance to be billed (if applicable) for the services provided during this visit. Depending on your insurance coverage, you may receive a charge related to this service.  I need to obtain your verbal consent now. Are you willing to proceed with your visit today? MONTI VILLERS has provided verbal consent on 03/28/2024 for a virtual visit (video or telephone). Terri [name], [place]  Date: 03/28/2024 4:11 PM   Virtual Visit via Video Note   I, Terri [name], connected with  Terri [name]  ([id number], Dec 09, 1965) on 03/28/24 at  4:15 PM EDT by a video-enabled telemedicine application and verified that I am speaking with the correct person using two identifiers.  Location: Patient: Virtual Visit Location  Patient: Home Provider: Virtual Visit Location Provider: Home Office   I discussed the limitations of evaluation and management by telemedicine and the availability of in person appointments. The patient expressed understanding and agreed to proceed.    History of Present Illness: Terri [name] is a 58 y.o. who identifies as a female who was assigned female at birth, and is being seen today for UTI.  HPI: Urinary Tract Infection  The current episode started yesterday. The problem occurs every urination. The problem has been unchanged. Associated symptoms include frequency and urgency. The treatment provided no relief. Her past medical history is significant for recurrent UTIs.    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   COPD exacerbation (HCC) 08/19/2012   Diabetes (HCC) 08/19/2012   Foreign body in stomach 01/02/2012   Dysphagia 11/17/2011    Allergies:  Allergies  Allergen Reactions   Sulfa Antibiotics Other (See Comments)    paralyzes me Made legs paralyzed   Iodine Rash    Topical only - not IV dye Topical only - not IV dye    Cephalexin Itching and Rash   Ciprofloxacin Hcl Itching and Rash   Penicillins Itching and Rash   Medications:  Current Outpatient Medications:    albuterol  (PROVENTIL  HFA;VENTOLIN  HFA) 108 (90 BASE) MCG/ACT inhaler,  Inhale 2 puffs into the lungs every 6 (six) hours as needed for wheezing or shortness of breath., Disp: , Rfl:    calcium carbonate (TUMS - DOSED IN MG ELEMENTAL CALCIUM) 500 MG chewable tablet, Chew 2 tablets by mouth as needed., Disp: , Rfl:    diazepam (VALIUM) 5 MG tablet, Take 1 tablet by mouth as needed., Disp: , Rfl:    escitalopram (LEXAPRO) 20 MG tablet, Take 20 mg by mouth daily., Disp: ,  Rfl:    ipratropium-albuterol  (DUONEB) 0.5-2.5 (3) MG/3ML SOLN, Take 3 mLs by nebulization every 6 (six) hours as needed., Disp: 70 mL, Rfl: 0   meclizine  (ANTIVERT ) 25 MG tablet, Take 1 tablet (25 mg total) by mouth 3 (three) times daily as needed for dizziness., Disp: 15 tablet, Rfl: 0   nitrofurantoin , macrocrystal-monohydrate, (MACROBID ) 100 MG capsule, Take 1 capsule (100 mg total) by mouth 2 (two) times daily., Disp: 7 capsule, Rfl: 0   nitrofurantoin , macrocrystal-monohydrate, (MACROBID ) 100 MG capsule, Take 1 capsule (100 mg total) by mouth 2 (two) times daily., Disp: 14 capsule, Rfl: 0   oseltamivir  (TAMIFLU ) 75 MG capsule, Take 1 capsule (75 mg total) by mouth 2 (two) times daily., Disp: 10 capsule, Rfl: 0   OZEMPIC, 1 MG/DOSE, 4 MG/3ML SOPN, Inject 1 mg into the skin once a week., Disp: , Rfl:    rosuvastatin (CRESTOR) 40 MG tablet, Take 40 mg by mouth daily., Disp: , Rfl:    tiZANidine (ZANAFLEX) 4 MG tablet, Take 4 mg by mouth 3 (three) times daily as needed., Disp: , Rfl:    Vitamin D, Ergocalciferol, (DRISDOL) 1.25 MG (50000 UT) CAPS capsule, Take 1 capsule by mouth once a week., Disp: , Rfl:   Observations/Objective: Patient is well-developed, well-nourished in no acute distress.  Resting comfortably  at home.  Head is normocephalic, atraumatic.  No labored breathing.  Speech is clear and coherent with logical content.  Patient is alert and oriented at baseline.    Assessment and Plan: 1. Suspected UTI (Primary)   Patient presenting with vaginal irritation most likely UTI.   I also considered PID, pregnancy, ectopic pregnancy, BV, endometriosis,  tubovarian abscess, appendicitis, yeast vaginitis,  and pyelonephritis, but this appears less likely considering the data gathered thus far.  Medication prescribed. I have instructed the patient to present to the nearest ER at any time if there are any new or worsening symptoms.  The patient expressed understanding of and agreement  with this plan.  Opportunity was given for questions prior to discharge and all stated questions were answered to the patient's satisfaction.   Follow Up Instructions: I discussed the assessment and treatment plan with the patient. The patient was provided an opportunity to ask questions and all were answered. The patient agreed with the plan and demonstrated an understanding of the instructions.  A copy of instructions were sent to the patient via MyChart unless otherwise noted below.    The patient was advised to call back or seek an in-person evaluation if the symptoms worsen or if the condition fails to improve as anticipated.    Terri Shuck, PA-C

## 2024-04-06 ENCOUNTER — Encounter: Payer: Self-pay | Admitting: Radiology

## 2024-04-19 ENCOUNTER — Telehealth: Admitting: Physician Assistant

## 2024-04-19 DIAGNOSIS — N390 Urinary tract infection, site not specified: Secondary | ICD-10-CM | POA: Diagnosis not present

## 2024-04-19 MED ORDER — NITROFURANTOIN MONOHYD MACRO 100 MG PO CAPS
100.0000 mg | ORAL_CAPSULE | Freq: Two times a day (BID) | ORAL | 0 refills | Status: DC
Start: 2024-04-19 — End: 2024-04-29

## 2024-04-19 NOTE — Patient Instructions (Signed)
 [name], thank you for joining Delon CHRISTELLA Dickinson, PA-C for today's virtual visit.  While this provider is not your primary care provider (PCP), if your PCP is located in our provider database this encounter information will be shared with them immediately following your visit.   A [facility] MyChart account gives you access to today's visit and all your visits, tests, and labs performed at [facility]  click here if you don't have a [facility] MyChart account or go to [email or web address]  Consent: (Patient) Terri [name] provided verbal consent for this virtual visit at the beginning of the encounter.  Current Medications:  Current Outpatient Medications:    nitrofurantoin , macrocrystal-monohydrate, (MACROBID ) 100 MG capsule, Take 1 capsule (100 mg total) by mouth 2 (two) times daily., Disp: 14 capsule, Rfl: 0   albuterol  (PROVENTIL  HFA;VENTOLIN  HFA) 108 (90 BASE) MCG/ACT inhaler, Inhale 2 puffs into the lungs every 6 (six) hours as needed for wheezing or shortness of breath., Disp: , Rfl:    calcium carbonate (TUMS - DOSED IN MG ELEMENTAL CALCIUM) 500 MG chewable tablet, Chew 2 tablets by mouth as needed., Disp: , Rfl:    diazepam (VALIUM) 5 MG tablet, Take 1 tablet by mouth as needed., Disp: , Rfl:    escitalopram (LEXAPRO) 20 MG tablet, Take 20 mg by mouth daily., Disp: , Rfl:    ipratropium-albuterol  (DUONEB) 0.5-2.5 (3) MG/3ML SOLN, Take 3 mLs by nebulization every 6 (six) hours as needed., Disp: 70 mL, Rfl: 0   meclizine  (ANTIVERT ) 25 MG tablet, Take 1 tablet (25 mg total) by mouth 3 (three) times daily as needed for dizziness., Disp: 15 tablet, Rfl: 0   oseltamivir  (TAMIFLU ) 75 MG capsule, Take 1 capsule (75 mg total) by mouth 2 (two) times daily., Disp: 10 capsule, Rfl: 0   OZEMPIC, 1 MG/DOSE, 4 MG/3ML SOPN, Inject 1 mg into the skin once a week., Disp: , Rfl:    rosuvastatin (CRESTOR) 40 MG tablet, Take 40 mg by mouth daily., Disp: , Rfl:    tiZANidine  (ZANAFLEX) 4 MG tablet, Take 4 mg by mouth 3 (three) times daily as needed., Disp: , Rfl:    Vitamin D, Ergocalciferol, (DRISDOL) 1.25 MG (50000 UT) CAPS capsule, Take 1 capsule by mouth once a week., Disp: , Rfl:    Medications ordered in this encounter:  Meds ordered this encounter  Medications   nitrofurantoin , macrocrystal-monohydrate, (MACROBID ) 100 MG capsule    Sig: Take 1 capsule (100 mg total) by mouth 2 (two) times daily.    Dispense:  14 capsule    Refill:  0    Supervising Provider:   BLAISE ALEENE KIDD [[id number]]     *If you need refills on other medications prior to your next appointment, please contact your pharmacy*  Follow-Up: Call back or seek an in-person evaluation if the symptoms worsen or if the condition fails to improve as anticipated.  [facility] [phone]  Other Instructions Urinary Tract Infection, Female A urinary tract infection (UTI) is an infection in your urinary tract. The urinary tract is made up of organs that make, store, and get rid of pee (urine) in your body. These organs include: The kidneys. The ureters. The bladder. The urethra. What are the causes? Most UTIs are caused by germs called bacteria. They may be in or near your genitals. These germs grow and cause swelling in your urinary tract. What increases the risk? You're more likely to get a UTI if: You're a female.  The urethra is shorter in females than in males. You have a soft tube called a catheter that drains your pee. You can't control when you pee or poop. You have trouble peeing because of: A kidney stone. A urinary blockage. A nerve condition that affects your bladder. Not getting enough to drink. You're sexually active. You use a birth control inside your vagina, like spermicide. You're pregnant. You have low levels of the hormone estrogen in your body. You're an older adult. You're also more likely to get a UTI if you have other health problems. These  may include: Diabetes. A weak immune system. Your immune system is your body's defense system. Sickle cell disease. Injury of the spine. What are the signs or symptoms? Symptoms may include: Needing to pee right away. Peeing small amounts often. Pain or burning when you pee. Blood in your pee. Pee that smells bad or odd. Pain in your belly or lower back. You may also: Feel confused. This may be the first symptom in older adults. Vomit. Not feel hungry. Feel tired or easily annoyed. Have a fever or chills. How is this diagnosed? A UTI is diagnosed based on your medical history and an exam. You may also have other tests. These may include: Pee tests. Blood tests. Tests for sexually transmitted infections (STIs). If you've had more than one UTI, you may need to have imaging studies done to find out why you keep getting them. How is this treated? A UTI can be treated by: Taking antibiotics or other medicines. Drinking enough fluid to keep your pee pale yellow. In rare cases, a UTI can cause a very bad condition called sepsis. Sepsis may be treated in the hospital. Follow these instructions at home: Medicines Take your medicines only as told by your health care provider. If you were given antibiotics, take them as told by your provider. Do not stop taking them even if you start to feel better. General instructions Make sure you: Pee often and fully. Do not hold your pee for a long time. Wipe from front to back after you pee or poop. Use each tissue only once when you wipe. Pee after you have sex. Do not douche or use sprays or powders in your genital area. Contact a health care provider if: Your symptoms don't get better after 1-2 days of taking antibiotics. Your symptoms go away and then come back. You have a fever or chills. You vomit or feel like you may vomit. Get help right away if: You have very bad pain in your back or lower belly. You faint. This information is not  intended to replace advice given to you by your health care provider. Make sure you discuss any questions you have with your health care provider. Document Revised: 05/01/2023 Document Reviewed: 08/24/2022 Elsevier Patient Education  [employer].   If you have been instructed to have an in-person evaluation today at a local Urgent Care facility, please use the link below. It will take you to a list of all of our available [facility], including address, phone number and hours of operation. Please do not delay care.  [facility]  If you or a family member do not have a primary care provider, use the link below to schedule a visit and establish care. When you choose a [facility] primary care physician or advanced practice provider, you gain a long-term partner in health. Find a Primary Care Provider  Learn more about [facility]'s  in-office and virtual care options: [facility] - Get Care Now

## 2024-04-19 NOTE — Progress Notes (Signed)
 Virtual Visit Consent   Terri [name], you are scheduled for a virtual visit with a [facility] provider today. Just as with appointments in the office, your consent must be obtained to participate. Your consent will be active for this visit and any virtual visit you may have with one of our providers in the next 365 days. If you have a MyChart account, a copy of this consent can be sent to you electronically.  As this is a virtual visit, video technology does not allow for your provider to perform a traditional examination. This may limit your provider's ability to fully assess your condition. If your provider identifies any concerns that need to be evaluated in person or the need to arrange testing (such as labs, EKG, etc.), we will make arrangements to do so. Although advances in technology are sophisticated, we cannot ensure that it will always work on either your end or our end. If the connection with a video visit is poor, the visit may have to be switched to a telephone visit. With either a video or telephone visit, we are not always able to ensure that we have a secure connection.  By engaging in this virtual visit, you consent to the provision of healthcare and authorize for your insurance to be billed (if applicable) for the services provided during this visit. Depending on your insurance coverage, you may receive a charge related to this service.  I need to obtain your verbal consent now. Are you willing to proceed with your visit today? Terri [name] has provided verbal consent on 04/19/2024 for a virtual visit (video or telephone). Delon CHRISTELLA Dickinson, PA-C  Date: 04/19/2024 10:12 AM   Virtual Visit via Video Note   I, Delon CHRISTELLA Dickinson, connected with  Terri [name]  ([id number], March 07, 1966) on 04/19/24 at 10:00 AM EST by a video-enabled telemedicine application and verified that I am speaking with the correct person using two identifiers.  Location: Patient: Virtual Visit  Location Patient: Home Provider: Virtual Visit Location Provider: Home Office   I discussed the limitations of evaluation and management by telemedicine and the availability of in person appointments. The patient expressed understanding and agreed to proceed.    History of Present Illness: Terri [name] is a 58 y.o. who identifies as a female who was assigned female at birth, and is being seen today for recurrent UTI.  HPI: Urinary Tract Infection  This is a recurrent problem. The current episode started yesterday. The problem occurs every urination. The problem has been gradually worsening. The quality of the pain is described as aching and burning. The pain is mild. There has been no fever. Associated symptoms include frequency, hematuria, hesitancy and urgency. Pertinent negatives include no chills, discharge, flank pain, nausea, possible pregnancy, sweats or vomiting. She has tried antibiotics (AZO) for the symptoms. The treatment provided mild relief. Her past medical history is significant for recurrent UTIs.   Last seen Virtually on 03/28/24 and treated with Macrobid . Multiple medication allergies to antibiotics commonly used for treating UTIs complicating course.  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   COPD exacerbation (HCC) 08/19/2012   Diabetes (HCC) 08/19/2012  Foreign body in stomach 01/02/2012   Dysphagia 11/17/2011    Allergies:  Allergies  Allergen Reactions   Sulfa Antibiotics Other (See Comments)    paralyzes me Made legs paralyzed   Iodine Rash    Topical only - not IV dye Topical only - not IV dye    Cephalexin Itching and Rash    Ciprofloxacin Hcl Itching and Rash   Penicillins Itching and Rash   Medications:  Current Outpatient Medications:    nitrofurantoin , macrocrystal-monohydrate, (MACROBID ) 100 MG capsule, Take 1 capsule (100 mg total) by mouth 2 (two) times daily., Disp: 14 capsule, Rfl: 0   albuterol  (PROVENTIL  HFA;VENTOLIN  HFA) 108 (90 BASE) MCG/ACT inhaler, Inhale 2 puffs into the lungs every 6 (six) hours as needed for wheezing or shortness of breath., Disp: , Rfl:    calcium carbonate (TUMS - DOSED IN MG ELEMENTAL CALCIUM) 500 MG chewable tablet, Chew 2 tablets by mouth as needed., Disp: , Rfl:    diazepam (VALIUM) 5 MG tablet, Take 1 tablet by mouth as needed., Disp: , Rfl:    escitalopram (LEXAPRO) 20 MG tablet, Take 20 mg by mouth daily., Disp: , Rfl:    ipratropium-albuterol  (DUONEB) 0.5-2.5 (3) MG/3ML SOLN, Take 3 mLs by nebulization every 6 (six) hours as needed., Disp: 70 mL, Rfl: 0   meclizine  (ANTIVERT ) 25 MG tablet, Take 1 tablet (25 mg total) by mouth 3 (three) times daily as needed for dizziness., Disp: 15 tablet, Rfl: 0   oseltamivir  (TAMIFLU ) 75 MG capsule, Take 1 capsule (75 mg total) by mouth 2 (two) times daily., Disp: 10 capsule, Rfl: 0   OZEMPIC, 1 MG/DOSE, 4 MG/3ML SOPN, Inject 1 mg into the skin once a week., Disp: , Rfl:    rosuvastatin (CRESTOR) 40 MG tablet, Take 40 mg by mouth daily., Disp: , Rfl:    tiZANidine (ZANAFLEX) 4 MG tablet, Take 4 mg by mouth 3 (three) times daily as needed., Disp: , Rfl:    Vitamin D, Ergocalciferol, (DRISDOL) 1.25 MG (50000 UT) CAPS capsule, Take 1 capsule by mouth once a week., Disp: , Rfl:   Observations/Objective: Patient is well-developed, well-nourished in no acute distress.  Resting comfortably at home.  Head is normocephalic, atraumatic.  No labored breathing.  Speech is clear and coherent with logical content.  Patient is alert and oriented at baseline.    Assessment and Plan: 1. Recurrent UTI (Primary) - nitrofurantoin ,  macrocrystal-monohydrate, (MACROBID ) 100 MG capsule; Take 1 capsule (100 mg total) by mouth 2 (two) times daily.  Dispense: 14 capsule; Refill: 0  - Worsening symptoms.  - Will treat empirically with Macrobid  - May use AZO for bladder spasms - Continue to push fluids.  - Seek in person evaluation for urine culture if symptoms do not improve or if they worsen.    Follow Up Instructions: I discussed the assessment and treatment plan with the patient. The patient was provided an opportunity to ask questions and all were answered. The patient agreed with the plan and demonstrated an understanding of the instructions.  A copy of instructions were sent to the patient via MyChart unless otherwise noted below.    The patient was advised to call back or seek an in-person evaluation if the symptoms worsen or if the condition fails to improve as anticipated.    Delon CHRISTELLA Dickinson, PA-C

## 2024-04-29 ENCOUNTER — Telehealth: Admitting: Physician Assistant

## 2024-04-29 DIAGNOSIS — J441 Chronic obstructive pulmonary disease with (acute) exacerbation: Secondary | ICD-10-CM | POA: Diagnosis not present

## 2024-04-29 MED ORDER — PREDNISONE 20 MG PO TABS
40.0000 mg | ORAL_TABLET | Freq: Every day | ORAL | 0 refills | Status: AC
Start: 2024-04-29 — End: ?

## 2024-04-29 MED ORDER — BENZONATATE 100 MG PO CAPS
100.0000 mg | ORAL_CAPSULE | Freq: Three times a day (TID) | ORAL | 0 refills | Status: AC | PRN
Start: 2024-04-29 — End: ?

## 2024-04-29 MED ORDER — DOXYCYCLINE HYCLATE 100 MG PO TABS
100.0000 mg | ORAL_TABLET | Freq: Two times a day (BID) | ORAL | 0 refills | Status: AC
Start: 2024-04-29 — End: ?

## 2024-04-29 MED ORDER — IPRATROPIUM-ALBUTEROL 0.5-2.5 (3) MG/3ML IN SOLN
3.0000 mL | Freq: Four times a day (QID) | RESPIRATORY_TRACT | 0 refills | Status: AC | PRN
Start: 2024-04-29 — End: ?

## 2024-04-29 NOTE — Patient Instructions (Signed)
 Terri [name], thank you for joining [name], PA-C for today's virtual visit.  While this provider is not your primary care provider (PCP), if your PCP is located in our provider database this encounter information will be shared with them immediately following your visit.   A [facility] MyChart account gives you access to today's visit and all your visits, tests, and labs performed at [facility]  click here if you don't have a [facility] MyChart account or go to [email or web address]  Consent: (Patient) Terri [name] provided verbal consent for this virtual visit at the beginning of the encounter.  Current Medications:  Current Outpatient Medications:    albuterol  (PROVENTIL  HFA;VENTOLIN  HFA) 108 (90 BASE) MCG/ACT inhaler, Inhale 2 puffs into the lungs every 6 (six) hours as needed for wheezing or shortness of breath., Disp: , Rfl:    calcium carbonate (TUMS - DOSED IN MG ELEMENTAL CALCIUM) 500 MG chewable tablet, Chew 2 tablets by mouth as needed., Disp: , Rfl:    diazepam (VALIUM) 5 MG tablet, Take 1 tablet by mouth as needed., Disp: , Rfl:    escitalopram (LEXAPRO) 20 MG tablet, Take 20 mg by mouth daily., Disp: , Rfl:    ipratropium-albuterol  (DUONEB) 0.5-2.5 (3) MG/3ML SOLN, Take 3 mLs by nebulization every 6 (six) hours as needed., Disp: 70 mL, Rfl: 0   meclizine  (ANTIVERT ) 25 MG tablet, Take 1 tablet (25 mg total) by mouth 3 (three) times daily as needed for dizziness., Disp: 15 tablet, Rfl: 0   nitrofurantoin , macrocrystal-monohydrate, (MACROBID ) 100 MG capsule, Take 1 capsule (100 mg total) by mouth 2 (two) times daily., Disp: 14 capsule, Rfl: 0   oseltamivir  (TAMIFLU ) 75 MG capsule, Take 1 capsule (75 mg total) by mouth 2 (two) times daily., Disp: 10 capsule, Rfl: 0   OZEMPIC, 1 MG/DOSE, 4 MG/3ML SOPN, Inject 1 mg into the skin once a week., Disp: , Rfl:    rosuvastatin (CRESTOR) 40 MG tablet, Take 40 mg by mouth daily., Disp: , Rfl:    tiZANidine  (ZANAFLEX) 4 MG tablet, Take 4 mg by mouth 3 (three) times daily as needed., Disp: , Rfl:    Vitamin D, Ergocalciferol, (DRISDOL) 1.25 MG (50000 UT) CAPS capsule, Take 1 capsule by mouth once a week., Disp: , Rfl:    Medications ordered in this encounter:  No orders of the defined types were placed in this encounter.    *If you need refills on other medications prior to your next appointment, please contact your pharmacy*  Follow-Up: Call back or seek an in-person evaluation if the symptoms worsen or if the condition fails to improve as anticipated.  [facility] [phone]  Other Instructions Please hydrate and rest.  Start a saline nasal rinse for any nasal congestion. I have refilled your nebulizer solution. Start the antibiotic and cough medication as directed. The prednisone  is to be used if any continued chest tightness or shortness of breath. If you note any non-resolving, new, or worsening symptoms despite treatment, please seek an in-person evaluation ASAP.    If you have been instructed to have an in-person evaluation today at a local Urgent Care facility, please use the link below. It will take you to a list of all of our available [facility], including address, phone number and hours of operation. Please do not delay care.  [facility]  If you or a family member do not have a primary care provider, use the link  below to schedule a visit and establish care. When you choose a [facility] primary care physician or advanced practice provider, you gain a long-term partner in health. Find a Primary Care Provider  Learn more about [facility] in-office and virtual care options: [facility] - Get Care Now

## 2024-04-29 NOTE — Progress Notes (Signed)
 Virtual Visit Consent   Terri [name], you are scheduled for a virtual visit with a [facility] provider today. Just as with appointments in the office, your consent must be obtained to participate. Your consent will be active for this visit and any virtual visit you may have with one of our providers in the next 365 days. If you have a MyChart account, a copy of this consent can be sent to you electronically.  As this is a virtual visit, video technology does not allow for your provider to perform a traditional examination. This may limit your provider's ability to fully assess your condition. If your provider identifies any concerns that need to be evaluated in person or the need to arrange testing (such as labs, EKG, etc.), we will make arrangements to do so. Although advances in technology are sophisticated, we cannot ensure that it will always work on either your end or our end. If the connection with a video visit is poor, the visit may have to be switched to a telephone visit. With either a video or telephone visit, we are not always able to ensure that we have a secure connection.  By engaging in this virtual visit, you consent to the provision of healthcare and authorize for your insurance to be billed (if applicable) for the services provided during this visit. Depending on your insurance coverage, you may receive a charge related to this service.  I need to obtain your verbal consent now. Are you willing to proceed with your visit today? Terri [name] has provided verbal consent on 04/29/2024 for a virtual visit (video or telephone). Terri [name], [place]  Date: 04/29/2024 12:38 PM   Virtual Visit via Video Note   I, Terri [name], connected with  Terri [name]  ([id number], [birth year]) on 04/29/24 at 12:30 PM EST by a video-enabled telemedicine application and verified that I am speaking with the correct person using two identifiers.  Location: Patient: Virtual Visit  Location Patient: Home Provider: Virtual Visit Location Provider: Home Office   I discussed the limitations of evaluation and management by telemedicine and the availability of in person appointments. The patient expressed understanding and agreed to proceed.    History of Present Illness: Terri [name] is a 58 y.o. who identifies as a female who was assigned female at birth, and is being seen today for change in her chronic mucous with increased sputum production and thick green phlegm over the past 3 days or so. Denies fever, chills but sometimes feels hot. Cough is persistent but worse at night time. Mild chest tightness and SOB with exertion. Husband with similar symptoms starting last week. Denies recent travel. Denies exposure to COVID or influenza.  OTC -- Nyquil  Using albuterol  inhaler. Out of her nebulizer solution.  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   COPD exacerbation (HCC) 08/19/2012   Diabetes (HCC) 08/19/2012   Foreign body in stomach 01/02/2012   Dysphagia 11/17/2011    Allergies:  Allergies  Allergen Reactions   Sulfa Antibiotics Other (See Comments)    paralyzes me Made legs paralyzed   Iodine Rash    Topical only - not IV dye Topical only - not IV dye    Cephalexin Itching and Rash  Ciprofloxacin Hcl Itching and Rash   Penicillins Itching and Rash   Medications:  Current Outpatient Medications:    benzonatate  (TESSALON ) 100 MG capsule, Take 1 capsule (100 mg total) by mouth 3 (three) times daily as needed for cough., Disp: 30 capsule, Rfl: 0   doxycycline  (VIBRA -TABS) 100 MG tablet, Take 1 tablet (100 mg total) by mouth 2 (two) times daily., Disp: 14 tablet, Rfl: 0   predniSONE  (DELTASONE ) 20 MG  tablet, Take 2 tablets (40 mg total) by mouth daily with breakfast., Disp: 10 tablet, Rfl: 0   albuterol  (PROVENTIL  HFA;VENTOLIN  HFA) 108 (90 BASE) MCG/ACT inhaler, Inhale 2 puffs into the lungs every 6 (six) hours as needed for wheezing or shortness of breath., Disp: , Rfl:    calcium carbonate (TUMS - DOSED IN MG ELEMENTAL CALCIUM) 500 MG chewable tablet, Chew 2 tablets by mouth as needed., Disp: , Rfl:    diazepam (VALIUM) 5 MG tablet, Take 1 tablet by mouth as needed., Disp: , Rfl:    escitalopram (LEXAPRO) 20 MG tablet, Take 20 mg by mouth daily., Disp: , Rfl:    ipratropium-albuterol  (DUONEB) 0.5-2.5 (3) MG/3ML SOLN, Take 3 mLs by nebulization every 6 (six) hours as needed., Disp: 70 mL, Rfl: 0   meclizine  (ANTIVERT ) 25 MG tablet, Take 1 tablet (25 mg total) by mouth 3 (three) times daily as needed for dizziness., Disp: 15 tablet, Rfl: 0   OZEMPIC, 1 MG/DOSE, 4 MG/3ML SOPN, Inject 1 mg into the skin once a week., Disp: , Rfl:    rosuvastatin (CRESTOR) 40 MG tablet, Take 40 mg by mouth daily., Disp: , Rfl:    tiZANidine (ZANAFLEX) 4 MG tablet, Take 4 mg by mouth 3 (three) times daily as needed., Disp: , Rfl:    Vitamin D, Ergocalciferol, (DRISDOL) 1.25 MG (50000 UT) CAPS capsule, Take 1 capsule by mouth once a week., Disp: , Rfl:   Observations/Objective: Patient is well-developed, well-nourished in no acute distress.  Resting comfortably  at home.  Head is normocephalic, atraumatic.  No labored breathing.  Speech is clear and coherent with logical content.  Patient is alert and oriented at baseline.   Assessment and Plan: 1. COPD exacerbation (HCC) (Primary) - benzonatate  (TESSALON ) 100 MG capsule; Take 1 capsule (100 mg total) by mouth 3 (three) times daily as needed for cough.  Dispense: 30 capsule; Refill: 0 - ipratropium-albuterol  (DUONEB) 0.5-2.5 (3) MG/3ML SOLN; Take 3 mLs by nebulization every 6 (six) hours as needed.  Dispense: 70 mL; Refill: 0 - doxycycline  (VIBRA -TABS) 100 MG  tablet; Take 1 tablet (100 mg total) by mouth 2 (two) times daily.  Dispense: 14 tablet; Refill: 0 - predniSONE  (DELTASONE ) 20 MG tablet; Take 2 tablets (40 mg total) by mouth daily with breakfast.  Dispense: 10 tablet; Refill: 0  Concern viral URI causing COPD exacerbation but giving change in chronic sputum, will be cautious. Doxycycline  and Tessalon  per orders. Refill of her Duoneb sent in. Prednisone  burst if any continued or progressive chest tightness or SOB despite nebulizer. Strict in-person follow-up precautions reviewed with patient.   Follow Up Instructions: I discussed the assessment and treatment plan with the patient. The patient was provided an opportunity to ask questions and all were answered. The patient agreed with the plan and demonstrated an understanding of the instructions.  A copy of instructions were sent to the patient via MyChart unless otherwise noted below.   The patient was advised to call back or seek an in-person evaluation if the symptoms worsen or  if the condition fails to improve as anticipated.    Terri Velma Lunger, PA-C

## 2024-05-01 ENCOUNTER — Telehealth: Admitting: Physician Assistant

## 2024-05-01 ENCOUNTER — Telehealth

## 2024-05-01 DIAGNOSIS — J441 Chronic obstructive pulmonary disease with (acute) exacerbation: Secondary | ICD-10-CM

## 2024-05-01 MED ORDER — NEBULIZER MASK ADULT/TUBING MISC: 1.0000 | Freq: Every day | 2 refills | Status: AC | PRN

## 2024-05-01 NOTE — Progress Notes (Signed)
 We are sorry that you are not feeling well.  Here is how we plan to help!  I sent a prescription for nebulizer mask and tubing to your pharmacy.  Please let us  know if they are not able to provide the supplies and we can send in an order to a different medical supply store.   From your responses in the eVisit questionnaire you describe inflammation in the upper respiratory tract which is causing a significant cough.  This is commonly called Bronchitis and has four common causes:   Allergies Viral Infections Acid Reflux Bacterial Infection Allergies, viruses and acid reflux are treated by controlling symptoms or eliminating the cause. An example might be a cough caused by taking certain blood pressure medications. You stop the cough by changing the medication. Another example might be a cough caused by acid reflux. Controlling the reflux helps control the cough.  USE OF BRONCHODILATOR (RESCUE) INHALERS: There is a risk from using your bronchodilator too frequently.  The risk is that over-reliance on a medication which only relaxes the muscles surrounding the breathing tubes can reduce the effectiveness of medications prescribed to reduce swelling and congestion of the tubes themselves.  Although you feel brief relief from the bronchodilator inhaler, your asthma may actually be worsening with the tubes becoming more swollen and filled with mucus.  This can delay other crucial treatments, such as oral steroid medications. If you need to use a bronchodilator inhaler daily, several times per day, you should discuss this with your provider.  There are probably better treatments that could be used to keep your asthma under control.     HOME CARE Only take medications as instructed by your medical team. Complete the entire course of an antibiotic. Drink plenty of fluids and get plenty of rest. Avoid close contacts especially the very young and the elderly Cover your mouth if you cough or cough into your  sleeve. Always remember to wash your hands A steam or ultrasonic humidifier can help congestion.   GET HELP RIGHT AWAY IF: You develop worsening fever. You become short of breath You cough up blood. Your symptoms persist after you have completed your treatment plan MAKE SURE YOU  Understand these instructions. Will watch your condition. Will get help right away if you are not doing well or get worse.  Your e-visit answers were reviewed by a board certified advanced clinical practitioner to complete your personal care plan.  Depending on the condition, your plan could have included both over the counter or prescription medications. If there is a problem please reply  once you have received a response from your provider. Your safety is important to us .  If you have drug allergies check your prescription carefully.    You can use MyChart to ask questions about today's visit, request a non-urgent call back, or ask for a work or school excuse for 24 hours related to this e-Visit. If it has been greater than 24 hours you will need to follow up with your provider, or enter a new e-Visit to address those concerns. You will get an e-mail in the next two days asking about your experience.  I hope that your e-visit has been valuable and will speed your recovery. Thank you for using e-visits.   I have spent 5 minutes in review of e-visit questionnaire, review and updating patient chart, medical decision making and response to patient.   Harlene PEDLAR Ward, PA-C

## 2024-08-11 ENCOUNTER — Ambulatory Visit: Admitting: Urology
